# Patient Record
Sex: Male | Born: 1970
Health system: Southern US, Community
[De-identification: ages and names within clinical notes are randomized; demographics above are authoritative.]

## PROBLEM LIST (undated history)

## (undated) DIAGNOSIS — R251 Tremor, unspecified: Secondary | ICD-10-CM

## (undated) DIAGNOSIS — I77810 Thoracic aortic ectasia: Secondary | ICD-10-CM

## (undated) DIAGNOSIS — R011 Cardiac murmur, unspecified: Secondary | ICD-10-CM

## (undated) DIAGNOSIS — M1990 Unspecified osteoarthritis, unspecified site: Secondary | ICD-10-CM

## (undated) DIAGNOSIS — I493 Ventricular premature depolarization: Secondary | ICD-10-CM

## (undated) DIAGNOSIS — F17200 Nicotine dependence, unspecified, uncomplicated: Secondary | ICD-10-CM

## (undated) DIAGNOSIS — Z9289 Personal history of other medical treatment: Secondary | ICD-10-CM

## (undated) DIAGNOSIS — M549 Dorsalgia, unspecified: Secondary | ICD-10-CM

## (undated) DIAGNOSIS — F528 Other sexual dysfunction not due to a substance or known physiological condition: Secondary | ICD-10-CM

## (undated) DIAGNOSIS — M542 Cervicalgia: Secondary | ICD-10-CM

## (undated) HISTORY — DX: Thoracic aortic ectasia: I77.810

## (undated) HISTORY — DX: Ventricular premature depolarization: I49.3

## (undated) HISTORY — DX: Dorsalgia, unspecified: M54.9

## (undated) HISTORY — DX: Morbid (severe) obesity due to excess calories: E66.01

## (undated) HISTORY — DX: Other sexual dysfunction not due to a substance or known physiological condition: F52.8

## (undated) HISTORY — DX: Unspecified osteoarthritis, unspecified site: M19.90

## (undated) HISTORY — DX: Nicotine dependence, unspecified, uncomplicated: F17.200

## (undated) HISTORY — DX: Cervicalgia: M54.2

## (undated) HISTORY — DX: Tremor, unspecified: R25.1

---

## 1995-07-12 DIAGNOSIS — S069X9A Unspecified intracranial injury with loss of consciousness of unspecified duration, initial encounter: Secondary | ICD-10-CM

## 1995-07-12 HISTORY — DX: Unspecified intracranial injury with loss of consciousness of unspecified duration, initial encounter: S06.9X9A

## 1995-09-09 HISTORY — PX: FACIAL FRACTURE SURGERY: SHX1570

## 1997-12-22 ENCOUNTER — Emergency Department (HOSPITAL_COMMUNITY): Admission: EM | Admit: 1997-12-22 | Discharge: 1997-12-22 | Payer: Self-pay | Admitting: Emergency Medicine

## 1998-03-24 ENCOUNTER — Encounter: Payer: Self-pay | Admitting: Emergency Medicine

## 1998-03-24 ENCOUNTER — Emergency Department (HOSPITAL_COMMUNITY): Admission: EM | Admit: 1998-03-24 | Discharge: 1998-03-24 | Payer: Self-pay | Admitting: Emergency Medicine

## 1998-10-27 ENCOUNTER — Emergency Department (HOSPITAL_COMMUNITY): Admission: EM | Admit: 1998-10-27 | Discharge: 1998-10-27 | Payer: Self-pay | Admitting: Emergency Medicine

## 2001-12-05 ENCOUNTER — Emergency Department (HOSPITAL_COMMUNITY): Admission: EM | Admit: 2001-12-05 | Discharge: 2001-12-05 | Payer: Self-pay

## 2002-03-17 ENCOUNTER — Emergency Department (HOSPITAL_COMMUNITY): Admission: EM | Admit: 2002-03-17 | Discharge: 2002-03-17 | Payer: Self-pay | Admitting: Emergency Medicine

## 2002-09-25 ENCOUNTER — Encounter: Payer: Self-pay | Admitting: Emergency Medicine

## 2002-09-25 ENCOUNTER — Emergency Department (HOSPITAL_COMMUNITY): Admission: EM | Admit: 2002-09-25 | Discharge: 2002-09-25 | Payer: Self-pay | Admitting: Emergency Medicine

## 2005-02-11 ENCOUNTER — Emergency Department (HOSPITAL_COMMUNITY): Admission: EM | Admit: 2005-02-11 | Discharge: 2005-02-11 | Payer: Self-pay | Admitting: Emergency Medicine

## 2007-11-12 ENCOUNTER — Ambulatory Visit: Payer: Self-pay | Admitting: Family Medicine

## 2007-11-12 DIAGNOSIS — F528 Other sexual dysfunction not due to a substance or known physiological condition: Secondary | ICD-10-CM | POA: Insufficient documentation

## 2007-11-12 DIAGNOSIS — F17200 Nicotine dependence, unspecified, uncomplicated: Secondary | ICD-10-CM

## 2007-11-12 DIAGNOSIS — Z720 Tobacco use: Secondary | ICD-10-CM | POA: Insufficient documentation

## 2007-11-12 HISTORY — DX: Nicotine dependence, unspecified, uncomplicated: F17.200

## 2007-11-12 HISTORY — DX: Other sexual dysfunction not due to a substance or known physiological condition: F52.8

## 2007-11-16 ENCOUNTER — Encounter (INDEPENDENT_AMBULATORY_CARE_PROVIDER_SITE_OTHER): Payer: Self-pay | Admitting: Family Medicine

## 2007-11-23 ENCOUNTER — Encounter (INDEPENDENT_AMBULATORY_CARE_PROVIDER_SITE_OTHER): Payer: Self-pay | Admitting: Family Medicine

## 2007-12-11 ENCOUNTER — Encounter (INDEPENDENT_AMBULATORY_CARE_PROVIDER_SITE_OTHER): Payer: Self-pay | Admitting: Family Medicine

## 2007-12-11 ENCOUNTER — Ambulatory Visit: Payer: Self-pay | Admitting: Family Medicine

## 2007-12-11 LAB — CONVERTED CEMR LAB
ALT: 22 units/L (ref 0–53)
AST: 19 units/L (ref 0–37)
Albumin: 4.4 g/dL (ref 3.5–5.2)
Alkaline Phosphatase: 63 units/L (ref 39–117)
BUN: 17 mg/dL (ref 6–23)
CO2: 22 meq/L (ref 19–32)
Calcium: 9.4 mg/dL (ref 8.4–10.5)
Chloride: 106 meq/L (ref 96–112)
Cholesterol: 172 mg/dL (ref 0–200)
Creatinine, Ser: 1.14 mg/dL (ref 0.40–1.50)
Glucose, Bld: 96 mg/dL (ref 70–99)
HCV Ab: NEGATIVE
HDL: 56 mg/dL (ref >39)
Hepatitis B Surface Ag: NEGATIVE
LDL Cholesterol: 94 mg/dL (ref 0–99)
Potassium: 4.5 meq/L (ref 3.5–5.3)
Sodium: 138 meq/L (ref 135–145)
TSH: 1.952 microintl units/mL (ref 0.350–5.50)
Total Bilirubin: 0.4 mg/dL (ref 0.3–1.2)
Total CHOL/HDL Ratio: 3.1
Total Protein: 7.7 g/dL (ref 6.0–8.3)
Triglycerides: 112 mg/dL (ref <150)
VLDL: 22 mg/dL (ref 0–40)

## 2007-12-13 ENCOUNTER — Encounter (INDEPENDENT_AMBULATORY_CARE_PROVIDER_SITE_OTHER): Payer: Self-pay | Admitting: Family Medicine

## 2007-12-31 ENCOUNTER — Encounter (INDEPENDENT_AMBULATORY_CARE_PROVIDER_SITE_OTHER): Payer: Self-pay | Admitting: Family Medicine

## 2008-01-25 ENCOUNTER — Encounter (INDEPENDENT_AMBULATORY_CARE_PROVIDER_SITE_OTHER): Payer: Self-pay | Admitting: *Deleted

## 2008-01-25 ENCOUNTER — Encounter (INDEPENDENT_AMBULATORY_CARE_PROVIDER_SITE_OTHER): Payer: Self-pay | Admitting: Family Medicine

## 2008-04-09 ENCOUNTER — Emergency Department (HOSPITAL_COMMUNITY): Admission: EM | Admit: 2008-04-09 | Discharge: 2008-04-10 | Payer: Self-pay | Admitting: Emergency Medicine

## 2008-05-29 ENCOUNTER — Encounter (INDEPENDENT_AMBULATORY_CARE_PROVIDER_SITE_OTHER): Payer: Self-pay | Admitting: Family Medicine

## 2008-06-09 ENCOUNTER — Encounter: Payer: Self-pay | Admitting: *Deleted

## 2009-10-02 ENCOUNTER — Telehealth: Payer: Self-pay | Admitting: Family Medicine

## 2010-03-11 ENCOUNTER — Emergency Department (HOSPITAL_COMMUNITY): Admission: EM | Admit: 2010-03-11 | Discharge: 2010-03-11 | Payer: Self-pay | Admitting: Emergency Medicine

## 2010-06-23 ENCOUNTER — Emergency Department (HOSPITAL_COMMUNITY)
Admission: EM | Admit: 2010-06-23 | Discharge: 2010-06-24 | Payer: Self-pay | Source: Home / Self Care | Admitting: Emergency Medicine

## 2010-08-10 NOTE — Progress Notes (Signed)
  Phone Note Call from Patient   Caller: Spouse Summary of Call:  bad stomach pains since yesterday.  has had BRBPR off and on for months.  yesterday the toilet was full of blood with one of his bowel movements, and he had several more episodes of rectal bleeding, but with less blood.  has been to ER once in the past and had CT scan that was normal (per pt).  hehe has pain is in lower abdomen and sometimes in back.  sometimes in sides.  no fevers.  not lightheaded.  pain is not debilitating.  advised to go to ER if he has another episode of large volume rectal bleeding, fever 101 or higher, or pain worsens.  most importantly, pt needs to follow up in clinic (he has not been seen  at our clinic since 5/09 and has multiple DNKAs) for evaluation of his bleeding.   Initial call taken by: Asher Muir MD,  October 02, 2009 9:49 PM

## 2010-08-10 NOTE — Letter (Signed)
Summary: Lipid Letter  Blake [name] Blake [name]  [address]   [place], [place] [address]   Phone: [phone]  Fax: [phone]    12/13/2007  Blake [name] [address] [place], [place]  [address]  Dear [name]:  I have carefully reviewed your labs from 12/11/2007 and the results are noted below with a summary of recommendations for lipid management.  Your electrolytes, kidney function, liver function, and thyroid function were all normal.  Your screening blood tests were all negative.  Your Cholesterol Panel is as follows:    Cholesterol:       172     Goal: < 200   HDL "good" Cholesterol:   56     Goal: > 40   LDL "bad" Cholesterol:   94     Goal: < 130   Triglycerides:       112     Goal: < 150  All of your values are within your target range currently; however, it is still beneficial for your health to follow the following dietary recommendations.      TLC Diet (Therapeutic Lifestyle Change): Saturated Fats & Transfatty acids should be kept < 7% of total calories ***Reduce Saturated Fats Polyunstaurated Fat can be up to 10% of total calories Monounsaturated Fat Fat can be up to 20% of total calories Total Fat should be no greater than 25-35% of total calories Carbohydrates should be 50-60% of total calories Protein should be approximately 15% of total calories Fiber should be at least 20-30 grams a day ***Increased fiber may help lower LDL Total Cholesterol should be < 200mg /day Consider adding plant stanol/sterols to diet (example: Benacol spread) ***A higher intake of unsaturated fat may reduce Triglycerides and Increase HDL    Adjunctive Measures (may lower LIPIDS and reduce risk of Heart Attack) include: Aerobic Exercise (20-30 minutes 3-4 times a week) Limit Alcohol Consumption Weight Reduction Vitamin E 400 IU a day by mouth Folic Acid 1mg  a day by mouth Dietary Fiber 20-30 grams a day by mouth    Current Medications: 1)    Cialis 10  Mg  Tabs (Tadalafil) .Marland Kitchen.. 1 tablet by mouth as needed prior to intercourse - do not take more than 1 tablet in 24 hours  2)    Wellbutrin Xl 150 Mg  Tb24 (Bupropion hcl) .Marland Kitchen.. 1 tablet by mouth every morning - start at least 7 days prior to your quit date  If you have any questions, please call. We appreciate being able to work with you.   Sincerely,    [name] Blake Dun MD

## 2010-08-10 NOTE — Assessment & Plan Note (Signed)
Summary: DNKA,AG      Current Allergies: No known allergies         Complete Medication List: 1)  Cialis 10 Mg Tabs (Tadalafil) .Marland Kitchen.. 1 tablet by mouth as needed prior to intercourse - do not take more than 1 tablet in 24 hours 2)  Wellbutrin Xl 150 Mg Tb24 (Bupropion hcl) .Marland Kitchen.. 1 tablet by mouth every morning - start at least 7 days prior to your quit date    ]

## 2010-08-29 ENCOUNTER — Encounter: Payer: Self-pay | Admitting: *Deleted

## 2010-09-23 LAB — URINALYSIS, ROUTINE W REFLEX MICROSCOPIC
Glucose, UA: NEGATIVE mg/dL
Hgb urine dipstick: NEGATIVE
Ketones, ur: 15 mg/dL — AB
Nitrite: NEGATIVE
Protein, ur: NEGATIVE mg/dL
Specific Gravity, Urine: 1.026 (ref 1.005–1.030)
Urobilinogen, UA: 1 mg/dL (ref 0.0–1.0)
pH: 5.5 (ref 5.0–8.0)

## 2011-04-11 LAB — URINALYSIS, ROUTINE W REFLEX MICROSCOPIC
Bilirubin Urine: NEGATIVE
Glucose, UA: NEGATIVE
Hgb urine dipstick: NEGATIVE
Ketones, ur: NEGATIVE
Nitrite: NEGATIVE
Protein, ur: NEGATIVE
Specific Gravity, Urine: 1.023
Urobilinogen, UA: 0.2
pH: 6

## 2011-04-11 LAB — COMPREHENSIVE METABOLIC PANEL
ALT: 23
AST: 17
Albumin: 4
Alkaline Phosphatase: 62
BUN: 9
CO2: 24
Calcium: 9.4
Chloride: 103
Creatinine, Ser: 1.14
GFR calc Af Amer: >60
GFR calc non Af Amer: >60
Glucose, Bld: 95
Potassium: 3.6
Sodium: 136
Total Bilirubin: 0.7
Total Protein: 7.2

## 2011-04-11 LAB — DIFFERENTIAL
Basophils Absolute: 0
Basophils Relative: 1
Eosinophils Absolute: 0.1
Eosinophils Relative: 2
Lymphocytes Relative: 34
Lymphs Abs: 2.6
Monocytes Absolute: 0.6
Monocytes Relative: 8
Neutro Abs: 4.3
Neutrophils Relative %: 55

## 2011-04-11 LAB — CBC
HCT: 43.6
Hemoglobin: 14.8
MCHC: 34
MCV: 93.2
Platelets: 243
RBC: 4.68
RDW: 14
WBC: 7.6

## 2011-04-11 LAB — LIPASE, BLOOD: Lipase: 38

## 2011-05-24 ENCOUNTER — Emergency Department (HOSPITAL_COMMUNITY)
Admission: EM | Admit: 2011-05-24 | Discharge: 2011-05-24 | Disposition: A | Discharge status: Home / Self Care | Payer: Self-pay | Attending: Emergency Medicine | Admitting: Emergency Medicine

## 2011-05-24 DIAGNOSIS — L509 Urticaria, unspecified: Secondary | ICD-10-CM | POA: Insufficient documentation

## 2011-05-24 DIAGNOSIS — F17200 Nicotine dependence, unspecified, uncomplicated: Secondary | ICD-10-CM | POA: Insufficient documentation

## 2011-05-24 MED ORDER — FAMOTIDINE 40 MG PO TABS: 20.0000 mg | ORAL_TABLET | Freq: Every day | ORAL | Status: DC

## 2011-05-24 MED ORDER — FAMOTIDINE 20 MG PO TABS
20.0000 mg | ORAL_TABLET | Freq: Once | ORAL | Status: AC
  Administered 2011-05-24: 20 mg via ORAL
  Filled 2011-05-24: qty 1

## 2011-05-24 MED ORDER — PREDNISONE 20 MG PO TABS
60.0000 mg | ORAL_TABLET | ORAL | Status: AC
  Administered 2011-05-24: 60 mg via ORAL
  Filled 2011-05-24: qty 1

## 2011-05-24 MED ORDER — DIPHENHYDRAMINE HCL 50 MG PO TABS: 25.0000 mg | ORAL_TABLET | Freq: Every evening | ORAL | Status: AC | PRN

## 2011-05-24 MED ORDER — DIPHENHYDRAMINE HCL 25 MG PO CAPS
25.0000 mg | ORAL_CAPSULE | Freq: Four times a day (QID) | ORAL | Status: DC | PRN
  Administered 2011-05-24: 25 mg via ORAL
  Filled 2011-05-24: qty 1

## 2011-05-24 MED ORDER — PREDNISONE 20 MG PO TABS: 20.0000 mg | ORAL_TABLET | Freq: Every day | ORAL | Status: AC

## 2011-05-24 NOTE — ED Notes (Signed)
Pt states that he had onset of hives last night, none at this time.  States then went away before he got here.

## 2011-05-24 NOTE — ED Provider Notes (Signed)
History     CSN: [id number] Arrival date & time: 05/24/2011  9:31 AM   First MD Initiated Contact with Patient 05/24/11 1021      Chief Complaint  Patient presents with  . Urticaria    Patient is a 40 y.o. male presenting with urticaria.  Urticaria Pertinent negatives include no abdominal pain, chest pain, chills, coughing, diaphoresis, fever, nausea, neck pain, numbness, rash, vomiting or weakness.   Patient seen and evaluated at 9:31. Patient presents to emergency room with complaint of urinary.. Reports that he developed hives x1 last night it lasted for about 5 minutes. They went away on on their. Patient denies any respiratory involvement. Patient reports that he developed hives this morning as well upon arising. Patient denies any change in his soaps, detergent, and lotions, his soaps or any other new product. Denies any recent exposure to the woods or any yard work. Patient denies any new or a change in food. Patient denies any difficulty swallowing. Symptoms have completely resolved since coming to the emergency room. History reviewed. No pertinent past medical history.  History reviewed. No pertinent past surgical history.  History reviewed. No pertinent family history.  History  Substance Use Topics  . Smoking status: Current Everyday Smoker -- 0.5 packs/day  . Smokeless tobacco: Not on file  . Alcohol Use: Yes     Drink on weekends      Review of Systems  Unable to perform ROS Constitutional: Negative for fever, chills, diaphoresis, activity change, appetite change and unexpected weight change.  HENT: Negative for neck pain.   Eyes: Negative for photophobia and visual disturbance.  Respiratory: Negative for cough, chest tightness and shortness of breath.   Cardiovascular: Negative for chest pain.  Gastrointestinal: Negative for nausea, vomiting and abdominal pain.  Genitourinary: Negative for flank pain.  Musculoskeletal: Negative for back pain.  Skin: Negative  for rash.  Neurological: Negative for weakness and numbness.  All other systems reviewed and are negative.    Allergies  Review of patient's allergies indicates no known allergies.  Home Medications   Current Outpatient Rx  Name Route Sig Dispense Refill  . THERA M PLUS PO TABS Oral Take 1 tablet by mouth daily.      Marland Kitchen TADALAFIL 10 MG PO TABS Oral Take 10 mg by mouth as needed.        BP 135/83  Pulse 79  Temp(Src) 97.6 F (36.4 C) (Oral)  Resp 16  SpO2 97%  Physical Exam  Constitutional: He is oriented to person, place, and time. He appears well-developed and well-nourished. No distress.  HENT:  Head: Normocephalic and atraumatic.  Mouth/Throat: Oropharynx is clear and moist. No oropharyngeal exudate.       No uvula or soft palate swelling.  Eyes: EOM are normal. Pupils are equal, round, and reactive to light. Right eye exhibits no discharge. Left eye exhibits no discharge.  Neck: Normal range of motion. Neck supple. No tracheal deviation present. No thyromegaly present.  Cardiovascular: Normal rate and regular rhythm.  Exam reveals no gallop and no friction rub.   No murmur heard. Pulmonary/Chest: Effort normal and breath sounds normal. No stridor. No respiratory distress. He has no wheezes. He has no rales. He exhibits no tenderness.  Abdominal: Soft. Bowel sounds are normal. He exhibits no mass. There is no tenderness.  Musculoskeletal: Normal range of motion.  Lymphadenopathy:    He has no cervical adenopathy.  Neurological: He is alert and oriented to person, place, and time.  Skin:  Skin is warm and dry. No rash noted. He is not diaphoretic. No erythema. No pallor.       No hives.  Psychiatric: He has a normal mood and affect. His behavior is normal. Judgment and thought content normal.    ED Course  Procedures (including critical care time)  Patient seen and evaluated.  VSS reviewed. . Nursing notes reviewed.  Initial testing ordered. Will monitor the patient  closely. They agree with the treatment plan and diagnosis. No imaging needed at this times. NO hives. No respiratory involvement. Advised patient of warning signs to return. Stated agreement and understanding.    MDM  Hives        Demetrius Charity, PA 05/24/11 1047

## 2011-05-24 NOTE — ED Notes (Signed)
No hives noted at this time. 

## 2011-05-25 NOTE — ED Provider Notes (Signed)
Medical screening examination/treatment/procedure(s) were performed by non-physician practitioner and as supervising physician I was immediately available for consultation/collaboration.  Raeford Razor, MD 05/25/11 [phone]

## 2011-08-12 ENCOUNTER — Encounter: Payer: Self-pay | Admitting: Sports Medicine

## 2011-08-18 ENCOUNTER — Encounter: Payer: Self-pay | Admitting: Family Medicine

## 2013-09-26 ENCOUNTER — Emergency Department (HOSPITAL_COMMUNITY)
Admission: EM | Admit: 2013-09-26 | Discharge: 2013-09-26 | Disposition: A | Discharge status: Home / Self Care | Payer: Self-pay | Attending: Emergency Medicine | Admitting: Emergency Medicine

## 2013-09-26 ENCOUNTER — Encounter (HOSPITAL_COMMUNITY): Payer: Self-pay | Admitting: Emergency Medicine

## 2013-09-26 DIAGNOSIS — R21 Rash and other nonspecific skin eruption: Secondary | ICD-10-CM | POA: Insufficient documentation

## 2013-09-26 DIAGNOSIS — F17200 Nicotine dependence, unspecified, uncomplicated: Secondary | ICD-10-CM | POA: Insufficient documentation

## 2013-09-26 DIAGNOSIS — Z79899 Other long term (current) drug therapy: Secondary | ICD-10-CM | POA: Insufficient documentation

## 2013-09-26 LAB — HIV ANTIBODY (ROUTINE TESTING W REFLEX): HIV: NONREACTIVE

## 2013-09-26 LAB — RPR: RPR Ser Ql: NONREACTIVE

## 2013-09-26 MED ORDER — CETIRIZINE HCL 10 MG PO CAPS: 1.0000 | ORAL_CAPSULE | Freq: Every day | ORAL | Status: DC

## 2013-09-26 NOTE — Discharge Instructions (Signed)
Rash A rash is a change in the color or texture of your skin. There are many different types of rashes. You may have other problems that accompany your rash. CAUSES   Infections.  Allergic reactions. This can include allergies to pets or foods.  Certain medicines.  Exposure to certain chemicals, soaps, or cosmetics.  Heat.  Exposure to poisonous plants.  Tumors, both cancerous and noncancerous. SYMPTOMS   Redness.  Scaly skin.  Itchy skin.  Dry or cracked skin.  Bumps.  Blisters.  Pain. DIAGNOSIS  Your caregiver may do a physical exam to determine what type of rash you have. A skin sample (biopsy) may be taken and examined under a microscope. TREATMENT  Treatment depends on the type of rash you have. Your caregiver may prescribe certain medicines. For serious conditions, you may need to see a skin doctor (dermatologist). HOME CARE INSTRUCTIONS   Avoid the substance that caused your rash.  Do not scratch your rash. This can cause infection.  You may take cool baths to help stop itching.  Only take over-the-counter or prescription medicines as directed by your caregiver.  Keep all follow-up appointments as directed by your caregiver. SEEK IMMEDIATE MEDICAL CARE IF:  You have increasing pain, swelling, or redness.  You have a fever.  You have new or severe symptoms.  You have body aches, diarrhea, or vomiting.  Your rash is not better after 3 days. MAKE SURE YOU:  Understand these instructions.  Will watch your condition.  Will get help right away if you are not doing well or get worse. Document Released: 06/17/2002 Document Revised: 09/19/2011 Document Reviewed: 04/11/2011 [facility] Patient Information 2014 [place], [place].   Emergency Department Resource Guide 1) Find a Doctor and Pay Out of Pocket Although you won't have to find out who is covered by your insurance plan, it is a good idea to ask around and get recommendations. You will then need to  call the office and see if the doctor you have chosen will accept you as a new patient and what types of options they offer for patients who are self-pay. Some doctors offer discounts or will set up payment plans for their patients who do not have insurance, but you will need to ask so you aren't surprised when you get to your appointment.  2) Contact Your Local Health Department Not all health departments have doctors that can see patients for sick visits, but many do, so it is worth a call to see if yours does. If you don't know where your local health department is, you can check in your phone book. The CDC also has a tool to help you locate your state's health department, and many state websites also have listings of all of their local health departments.  3) Find a [place] [facility] If your illness is not likely to be very severe or complicated, you may want to try a walk in clinic. These are popping up all over the country in pharmacies, drugstores, and shopping centers. They're usually staffed by nurse practitioners or physician assistants that have been trained to treat common illnesses and complaints. They're usually fairly quick and inexpensive. However, if you have serious medical issues or chronic medical problems, these are probably not your best option.  No Primary Care Doctor: - Call Health Connect at  [phone] - they can help you locate a primary care doctor that  accepts your insurance, provides certain services, etc. - Physician Referral Service- [phone]  Chronic Pain Problems: Organization  Address  Phone   Notes  [facility]  [phone] Patients need to be referred by their primary care doctor.   Medication Assistance: Organization         Address  Phone   Notes  [facility] Medication [facility] [address] [place], [place] [address] [phone] --Must be a resident of [facility] -- Must have NO insurance  coverage whatsoever (no Medicaid/ Medicare, etc.) -- The pt. MUST have a primary care doctor that directs their care regularly and follows them in the community   [facility]ist  [phone]   [employer]  [phone]    Agencies that provide inexpensive medical care: Organization         Address  Phone   Notes  [place]  [phone]   [facility]    [phone]   [facility] [place], [place] [address] [phone]   [place] [address], [place] [phone]   Planned Parenthood    [phone]   [facility]    [phone]   [place] [address], [place] Phone:  [phone], Fax:  [phone] Hours of Operation:  9 am - 6 pm, M-F.  Also accepts Medicaid/Medicare and self-pay.  [facility] for [place], [address], [place] Phone: [phone], Fax: [phone]. Hours of Operation:  8:30 am - 5:30 pm, M-F.  Also accepts Medicaid and self-pay.  [facility] [place] [address] [place] Phone: [phone]   [place], [place], [place] [phone], Ext. [phone] Mondays & Thursdays: 7-9 AM.  First 15 patients are seen on a first come, first serve basis.    [place] Providers:  Organization         Address  Phone   Notes  [facility] [address], [place] [phone] Also accepts self-pay patients.  [facility] [address], [place]  [phone]   [place], [address], [place] [phone]   [facility] [address] [place] [phone]   Lucianne Lei [address], [place]   [phone] Only accepts [place] patients after they have their  name applied to their card.   Self-Pay (no insurance) in [facility]:  Organization         Address  Phone   Notes  Sickle Cell Patients, [facility] Internal Medicine [place] [phone]   [facility] [place] [phone]   [facility] [place]  [place], [place], [place] [phone]   [facility]/Dr. Osei-Bonsu  [address] [place] or [address], [place] [phone] Phone number for both [place] and [place] locations is the same.  [facility] [address] [place] [phone]   [facility] [address] [place] or [address] [phone]   [facility] [address], [place] [phone], phone; [phone], fax Sees patients 1st and 3rd Saturday of every month.  Must not qualify  for public or private insurance (i.e. Medicaid, Medicare, [place] Health Choice, Veterans' Benefits)  Household income should be no more than 200% of the poverty level The clinic cannot treat you if you are pregnant or think you are pregnant  Sexually transmitted diseases are not treated at the clinic.   Dental Care: Organization         Address  Phone  Notes  [facility] [address], [place] [phone] Accepts children up to age 43 who are enrolled in [place] or [place] Health Choice; pregnant women with a Medicaid card; and children who have applied for Medicaid or [place] Health Choice, but were declined, whose parents can pay a reduced fee at time of service.  [facility] [place]  [address], [place] [phone] Accepts children up to age 43 who are enrolled in [place] or [place] Health Choice; pregnant women with a Medicaid card; and children who have applied for  Medicaid or [place] Health Choice, but were declined, whose parents can pay a reduced fee at time of service.  [facility]  [address], [place] [phone] Patients are seen by appointment only. Walk-ins are not accepted. [facility] will see patients 118 years of age and older. Monday - Tuesday (8am-5pm) Most Wednesdays (8:30-5pm) $30 per visit, cash only  [facility]  [address], [facility] [phone] Patients are seen by appointment only. Walk-ins are not accepted. [facility] will see patients 43 years of age and older. One Wednesday Evening (Monthly: Volunteer Based).  $30 per visit, cash only  [employer] [facility] [facility]  [phone]) [phone] for adults; Children under age 554, call Graduate Pediatric Dentistry at [phone]) [phone]. Children aged 54-14, please call [phone]) [phone] to request a pediatric application.  Dental services are provided in all areas of dental care including fillings, crowns and bridges, complete and partial dentures, implants, gum treatment, root canals, and extractions. Preventive care is also provided. Treatment is provided to both adults and children. Patients are selected via a lottery and there is often a waiting list.   [facility] [address], [place]  [phone]) [phone] [email or web address]   [facility] [address], [place], [place] [phone], Ext. [phone] Second and Fourth Thursday of each month, opens at 6:30 AM; Clinic ends at 9 AM.  Patients are seen on a first-come first-served basis, and a limited number are seen during each clinic.   [facility]  [address], [place], [place] [phone]) [phone]   Eligibility Requirements You must have lived in [place], [place], or [place] counties for at least the last three months.   You cannot be eligible for state or federal sponsored [place] Cityhealthcare insurance, including CIGNAVeterans Administration, [place]icaid,  or [employer]icare.   You generally cannot be eligible for healthcare insurance through your employer.    How to apply: Eligibility screenings are held every Tuesday and Wednesday afternoon from 1:00 pm until 4:00 pm. You do not need an appointment for the interview!  [facility] [address], [place], [place] [phone]   [facility]  [phone]   [facility]  [phone]   [facility]  [phone]    Behavioral Health Resources in the Community: Intensive Outpatient Programs Organization         Address  Phone  Notes  [facility] [address], [place], [place] [phone]   [facility] Outpatient [address] [place], [place] [phone]   [facility] [address], [place], [place]  [phone]   [facility] [address]  [place], [place] [phone] or [phone]   Substance Abuse Resources Organization         Address  Phone  Notes  [facility] Drug [facility]  [phone]   [facility]  [phone]   [facility] [place]  [phone]   Floydene Flock  [phone]   Residential & Outpatient Substance Abuse Program  [phone]   Psychological Services Organization         Address  Phone  Notes  [facility]al Health  [phone]   [facility]  [phone]   [facility] Mental [facility] [address] [place] [phone] or [phone]    Mobile Crisis Teams Organization         Address  Phone  Notes  [facility], Mobile Crisis Care Unit  [phone]   [facility]  [address] [place], [place] [phone]   Doristine Locks [address] [place] [phone]    Self-Help/Support Groups Organization         Address  Phone             Notes  [facility] [place] - variety of support groups   336- I7437963 Call for more information  Narcotics Anonymous (NA), [facility] [address], [employer] [place]  2 meetings at this location   Statistician         Address  Phone  Notes  [facility] [address],    [place]  [phone]   [facility]  [address], [place] [address], [place], [place] [phone]   [facility] Treatment Facility [address] [place], [place] [phone] Admissions: 8am-3pm M-F  [facility] [address],    [place], [place] [phone]   [facility] [address] [place], [place], [place] [phone]   [facility] [address]  [place], [place] [phone]   [facility] - Intensive Outpatient [address], [place], [place] [phone]   [facility] (Addiction Recovery [facility] [address].,  [place], [place] [phone] or [phone]   [facility] ([facility]) [address], [place], [place] [phone] Accepts Medicaid  [facility] [address]  [place] [phone] Substance Abuse/Addiction Treatment   [facility] Organization         Address  Phone  Notes  [facility]  [phone]   Angie Fava, PhD [address] [place], [place]   [phone] or [phone]   [facility] Behavioral   [address] [place], [place] [phone]   [facility] [address] [place], [place] [phone] Insurance/Medicaid/sponsorship through [facility] and Families [address]                                    [place], [place] [phone] Therapy/tele-psych/case  [facility] [address], [place] [phone]    Dr. Lolly Mustache  [phone]   [facility]  [facility] 1) [address], [place] 2) [address], [place] 3)  [address], [place] [phone]  [phone]   [facility] Child Abuse Hotline [phone]) [phone] or [phone]) [phone] (After Hours)

## 2013-09-26 NOTE — ED Notes (Signed)
Pt escorted to discharge window. Verbalized understanding discharge instructions. In no acute distress. Vitals reviewed and WDL.

## 2013-09-26 NOTE — ED Provider Notes (Signed)
CSN: [id number]     Arrival date & time 09/26/13  4098 History   First MD Initiated Contact with Patient 09/26/13 0920     Chief Complaint  Patient presents with  . Rash    HPI Patient states he's had a pruritic rash since December that involves his extremities and torso. The patient wasn't present at that time. He was evaluated multiple times by the medical staff. Patient states he was treated for scabies several times. He was given prescriptions for steroid creams as well as antifungal creams. Patient also was given moisturizing creams. He was also moved from one presenting to another. His symptoms never resolved and he continues to notice small red bumps on the skin. Patient states he just got out of prison 2 weeks ago and is living back at home. He continues to notice the rash. He did have eczema when he was young but otherwise has not had chronic skin problems. He denies any trouble with fevers or chills History reviewed. No pertinent past medical history. History reviewed. No pertinent past surgical history. No family history on file. History  Substance Use Topics  . Smoking status: Current Every Day Smoker -- 0.50 packs/day  . Smokeless tobacco: Not on file  . Alcohol Use: Yes     Comment: Drink on weekends    Review of Systems  Constitutional: Negative for fever.  Cardiovascular: Negative for chest pain.  Gastrointestinal: Negative for abdominal pain.      Allergies  Review of patient's allergies indicates no known allergies.  Home Medications   Current Outpatient Rx  Name  Route  Sig  Dispense  Refill  . Cetirizine HCl 10 MG CAPS   Oral   Take 1 capsule (10 mg total) by mouth daily.   30 capsule   1   . EXPIRED: famotidine (PEPCID) 40 MG tablet   Oral   Take 0.5 tablets (20 mg total) by mouth daily. OTC   10 tablet   0   . Multiple Vitamins-Minerals (MULTIVITAMINS THER. W/MINERALS) TABS   Oral   Take 1 tablet by mouth daily.          . tadalafil (CIALIS)  10 MG tablet   Oral   Take 10 mg by mouth as needed.            BP 145/78  Pulse 77  Temp(Src) 98.2 F (36.8 C) (Oral)  Resp 20  SpO2 99% Physical Exam  Nursing note and vitals reviewed. Constitutional: He appears well-developed and well-nourished. No distress.  HENT:  Head: Normocephalic and atraumatic.  Right Ear: External ear normal.  Left Ear: External ear normal.  Eyes: Conjunctivae are normal. Right eye exhibits no discharge. Left eye exhibits no discharge. No scleral icterus.  Neck: Neck supple. No tracheal deviation present.  Cardiovascular: Normal rate.   Pulmonary/Chest: Effort normal. No stridor. No respiratory distress.  Musculoskeletal: He exhibits no edema.  Neurological: He is alert. Cranial nerve deficit: no gross deficits.  Skin: Skin is warm and dry. Rash noted.  Few small papules noted diffusely, primarily several areas of excoriation noted, no pustules, no purpura or petechiae  Psychiatric: He has a normal mood and affect.    ED Course  Procedures (including critical care time) Labs Review Labs Reviewed  RPR  HIV ANTIBODY (ROUTINE TESTING)   Imaging Review No results found.   EKG Interpretation None      MDM   Final diagnoses:  Rash    Discussed possible scabies treatment however the  patient states he's done that treatment several times without improvement. I will do an HIV and RPR screen.  I'm not certain as to the etiology of this rash but there does not appear to be any emergency medical condition. I recommend followup with a primary care doctor or dermatologist.    Kathalene Frames, MD 09/26/13 [phone]

## 2013-09-26 NOTE — ED Notes (Signed)
Pt was in prison in Dec began having this rash to entire body. "Red bumps" states that it is there constantly it is not changed with time of day. He recenlty was released 2 weeks ago and still has the rash. Pt has been on topical steriods, po steriods, multiple different creams, was given the "scabies" cream also. States that he is currently using [employer] and Eucerin lotion with no change. Has had ezcema in when he was young. Did notice a little change around his neck, but not much.

## 2013-11-02 ENCOUNTER — Emergency Department (INDEPENDENT_AMBULATORY_CARE_PROVIDER_SITE_OTHER)
Admission: EM | Admit: 2013-11-02 | Discharge: 2013-11-02 | Disposition: A | Discharge status: Home / Self Care | Payer: PRIVATE HEALTH INSURANCE | Source: Home / Self Care | Attending: Family Medicine | Admitting: Family Medicine

## 2013-11-02 ENCOUNTER — Encounter (HOSPITAL_COMMUNITY): Payer: Self-pay | Admitting: Emergency Medicine

## 2013-11-02 DIAGNOSIS — R21 Rash and other nonspecific skin eruption: Secondary | ICD-10-CM

## 2013-11-02 MED ORDER — HYDROXYZINE HCL 25 MG PO TABS: 25.0000 mg | ORAL_TABLET | Freq: Three times a day (TID) | ORAL | Status: DC | PRN

## 2013-11-02 MED ORDER — DIPHENHYDRAMINE HCL 25 MG PO CAPS
50.0000 mg | ORAL_CAPSULE | Freq: Once | ORAL | Status: AC
  Administered 2013-11-02: 50 mg via ORAL

## 2013-11-02 MED ORDER — DIPHENHYDRAMINE HCL 25 MG PO CAPS
ORAL_CAPSULE | ORAL | Status: AC
  Filled 2013-11-02: qty 2

## 2013-11-02 NOTE — ED Provider Notes (Signed)
Medical screening examination/treatment/procedure(s) were performed by resident physician or non-physician practitioner and as supervising physician I was immediately available for consultation/collaboration.   Pauline Good MD.   Billy Fischer, MD 11/02/13 [phone]

## 2013-11-02 NOTE — ED Notes (Signed)
Reports onset 12/14.  Patient has been in multiple living situations including incarceration.  Patient has used various medications and creams.

## 2013-11-02 NOTE — Discharge Instructions (Signed)
You will be notified of your lab results when available. Please use medication as prescribed for itching.  Rash A rash is a change in the color or texture of your skin. There are many different types of rashes. You may have other problems that accompany your rash. CAUSES   Infections.  Allergic reactions. This can include allergies to pets or foods.  Certain medicines.  Exposure to certain chemicals, soaps, or cosmetics.  Heat.  Exposure to poisonous plants.  Tumors, both cancerous and noncancerous. SYMPTOMS   Redness.  Scaly skin.  Itchy skin.  Dry or cracked skin.  Bumps.  Blisters.  Pain. DIAGNOSIS  Your caregiver may do a physical exam to determine what type of rash you have. A skin sample (biopsy) may be taken and examined under a microscope. TREATMENT  Treatment depends on the type of rash you have. Your caregiver may prescribe certain medicines. For serious conditions, you may need to see a skin doctor (dermatologist). HOME CARE INSTRUCTIONS   Avoid the substance that caused your rash.  Do not scratch your rash. This can cause infection.  You may take cool baths to help stop itching.  Only take over-the-counter or prescription medicines as directed by your caregiver.  Keep all follow-up appointments as directed by your caregiver. SEEK IMMEDIATE MEDICAL CARE IF:  You have increasing pain, swelling, or redness.  You have a fever.  You have new or severe symptoms.  You have body aches, diarrhea, or vomiting.  Your rash is not better after 3 days. MAKE SURE YOU:  Understand these instructions.  Will watch your condition.  Will get help right away if you are not doing well or get worse. Document Released: 06/17/2002 Document Revised: 09/19/2011 Document Reviewed: 04/11/2011 [facility] Patient Information 2014 [place], [place].

## 2013-11-02 NOTE — ED Provider Notes (Signed)
CSN: [id number]     Arrival date & time 11/02/13  1252 History   First MD Initiated Contact with Patient 11/02/13 1454     Chief Complaint  Patient presents with  . Rash  . Pruritis   (Consider location/radiation/quality/duration/timing/severity/associated sxs/prior Treatment) HPI Comments: Patient states he is here for evaluation of a rash that he has had since Dec. 2014. When rash began, he was incarcerated and was treated for both rashes of allergic origin with oral medication and topical steroids Was also treated with topical medication for scabies. States rash has not improved. Over last 3 days, he reports he has also developed a larger painless lesion on top of his left scrotal sack.  Denies fever/chills. States rash is widespread and has not changed with any of the previously prescribed therapies.  Has no PCP. No mucous membrane lesions and states rash is extremely pruritic.   The history is provided by the patient.    History reviewed. No pertinent past medical history. Past Surgical History  Procedure Laterality Date  . Facial fracture surgery     No family history on file. History  Substance Use Topics  . Smoking status: Current Every Day Smoker -- 0.50 packs/day  . Smokeless tobacco: Not on file  . Alcohol Use: Yes     Comment: Drink on weekends    Review of Systems  Constitutional: Negative.   HENT: Negative.   Eyes: Negative.   Respiratory: Negative.   Cardiovascular: Negative.   Gastrointestinal: Negative.   Endocrine: Negative.   Genitourinary: Negative.   Musculoskeletal: Negative.   Skin: Positive for rash. Negative for color change, pallor and wound.  Allergic/Immunologic: Negative.   Neurological: Negative.   Hematological: Negative.   Psychiatric/Behavioral: Negative.     Allergies  Review of patient's allergies indicates no known allergies.  Home Medications   Prior to Admission medications   Medication Sig Start Date End Date Taking?  Authorizing Provider  Cetirizine HCl 10 MG CAPS Take 1 capsule (10 mg total) by mouth daily. 09/26/13   Kathalene Frames, MD   BP 144/95  Pulse 87  Temp(Src) 98.4 F (36.9 C) (Oral)  Resp 16  SpO2 96% Physical Exam  Nursing note and vitals reviewed. Constitutional: He is oriented to person, place, and time. He appears well-developed and well-nourished. No distress.  +morbidly obese  HENT:  Head: Normocephalic and atraumatic.  Nose: Nose normal.  Mouth/Throat: Oropharynx is clear and moist.  Eyes: Conjunctivae are normal. Right eye exhibits no discharge. Left eye exhibits no discharge. No scleral icterus.  Neck: Normal range of motion. Neck supple.  Cardiovascular: Normal rate.   Pulmonary/Chest: Effort normal.  Genitourinary: Testes normal and penis normal.    Right testis shows no mass, no swelling and no tenderness. Left testis shows no mass, no swelling and no tenderness. Circumcised.  Musculoskeletal: Normal range of motion.  Lymphadenopathy:    He has no cervical adenopathy.  Neurological: He is alert and oriented to person, place, and time.  Skin: Skin is warm and dry. Rash noted.  Diffuse maculopapular rash, many with superficial excoriations. No mucous membrane lesions. No visible lesions on palms of hands but patient does have lesions that extend to the soles of his feet.   Psychiatric: He has a normal mood and affect. His behavior is normal.    ED Course  Procedures (including critical care time) Labs Review Labs Reviewed  RPR  HIV ANTIBODY (ROUTINE TESTING)    Imaging Review No results found.  MDM   1. 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 Atarax as directed for itching. Notified patient we would contact him with lab results when available.    [place], [place] 11/02/13 [phone]

## 2013-11-02 NOTE — ED Notes (Signed)
Patient requesting medication to take now

## 2013-11-03 LAB — RPR

## 2013-11-03 LAB — HIV ANTIBODY (ROUTINE TESTING W REFLEX): HIV 1&2 Ab, 4th Generation: NONREACTIVE

## 2015-06-10 ENCOUNTER — Emergency Department (HOSPITAL_COMMUNITY): Payer: Self-pay

## 2015-06-10 ENCOUNTER — Encounter (HOSPITAL_COMMUNITY): Payer: Self-pay | Admitting: Cardiology

## 2015-06-10 ENCOUNTER — Emergency Department (HOSPITAL_COMMUNITY)
Admission: EM | Admit: 2015-06-10 | Discharge: 2015-06-10 | Disposition: A | Discharge status: Home / Self Care | Payer: Self-pay | Attending: Emergency Medicine | Admitting: Emergency Medicine

## 2015-06-10 DIAGNOSIS — F17200 Nicotine dependence, unspecified, uncomplicated: Secondary | ICD-10-CM | POA: Insufficient documentation

## 2015-06-10 DIAGNOSIS — Y92411 Interstate highway as the place of occurrence of the external cause: Secondary | ICD-10-CM | POA: Insufficient documentation

## 2015-06-10 DIAGNOSIS — S161XXA Strain of muscle, fascia and tendon at neck level, initial encounter: Secondary | ICD-10-CM | POA: Insufficient documentation

## 2015-06-10 DIAGNOSIS — S39012A Strain of muscle, fascia and tendon of lower back, initial encounter: Secondary | ICD-10-CM | POA: Insufficient documentation

## 2015-06-10 DIAGNOSIS — Y9389 Activity, other specified: Secondary | ICD-10-CM | POA: Insufficient documentation

## 2015-06-10 DIAGNOSIS — G44209 Tension-type headache, unspecified, not intractable: Secondary | ICD-10-CM | POA: Insufficient documentation

## 2015-06-10 DIAGNOSIS — Y999 Unspecified external cause status: Secondary | ICD-10-CM | POA: Insufficient documentation

## 2015-06-10 MED ORDER — IBUPROFEN 800 MG PO TABS
800.0000 mg | ORAL_TABLET | Freq: Once | ORAL | Status: AC
Start: 2015-06-10 — End: 2015-06-10
  Administered 2015-06-10: 800 mg via ORAL
  Filled 2015-06-10: qty 1

## 2015-06-10 MED ORDER — NAPROXEN 500 MG PO TABS: 500.0000 mg | ORAL_TABLET | Freq: Two times a day (BID) | ORAL | Status: DC

## 2015-06-10 MED ORDER — OXYCODONE-ACETAMINOPHEN 5-325 MG PO TABS
2.0000 | ORAL_TABLET | Freq: Once | ORAL | Status: AC
  Administered 2015-06-10: 2 via ORAL
  Filled 2015-06-10: qty 2

## 2015-06-10 MED ORDER — METHOCARBAMOL 500 MG PO TABS: 500.0000 mg | ORAL_TABLET | Freq: Three times a day (TID) | ORAL | Status: DC | PRN

## 2015-06-10 MED ORDER — OXYCODONE-ACETAMINOPHEN 5-325 MG PO TABS: 2.0000 | ORAL_TABLET | ORAL | Status: DC | PRN

## 2015-06-10 MED ORDER — METHOCARBAMOL 500 MG PO TABS
1000.0000 mg | ORAL_TABLET | Freq: Once | ORAL | Status: AC
  Administered 2015-06-10: 1000 mg via ORAL
  Filled 2015-06-10: qty 2

## 2015-06-10 NOTE — Discharge Instructions (Signed)
Cervical Sprain A cervical sprain is when the tissues (ligaments) that hold the neck bones in place stretch or tear. HOME CARE   Put ice on the injured area.  Put ice in a plastic bag.  Place a towel between your skin and the bag.  Leave the ice on for 15-20 minutes, 3-4 times a day.  You may have been given a collar to wear. This collar keeps your neck from moving while you heal.  Do not take the collar off unless told by your doctor.  If you have long hair, keep it outside of the collar.  Ask your doctor before changing the position of your collar. You may need to change its position over time to make it more comfortable.  If you are allowed to take off the collar for cleaning or bathing, follow your doctor's instructions on how to do it safely.  Keep your collar clean by wiping it with mild soap and water. Dry it completely. If the collar has removable pads, remove them every 1-2 days to hand wash them with soap and water. Allow them to air dry. They should be dry before you wear them in the collar.  Do not drive while wearing the collar.  Only take medicine as told by your doctor.  Keep all doctor visits as told.  Keep all physical therapy visits as told.  Adjust your work station so that you have good posture while you work.  Avoid positions and activities that make your problems worse.  Warm up and stretch before being active. GET HELP IF:  Your pain is not controlled with medicine.  You cannot take less pain medicine over time as planned.  Your activity level does not improve as expected. GET HELP RIGHT AWAY IF:   You are bleeding.  Your stomach is upset.  You have an allergic reaction to your medicine.  You develop new problems that you cannot explain.  You lose feeling (become numb) or you cannot move any part of your body (paralysis).  You have tingling or weakness in any part of your body.  Your symptoms get worse. Symptoms include:  Pain,  soreness, stiffness, puffiness (swelling), or a burning feeling in your neck.  Pain when your neck is touched.  Shoulder or upper back pain.  Limited ability to move your neck.  Headache.  Dizziness.  Your hands or arms feel week, lose feeling, or tingle.  Muscle spasms.  Difficulty swallowing or chewing. MAKE SURE YOU:   Understand these instructions.  Will watch your condition.  Will get help right away if you are not doing well or get worse.   This information is not intended to replace advice given to you by your health care provider. Make sure you discuss any questions you have with your health care provider.   Document Released: 12/14/2007 Document Revised: 02/27/2013 Document Reviewed: 01/02/2013 Elsevier Interactive Patient Education 2016 [employer]  Lumbosacral Strain Lumbosacral strain is a strain of any of the parts that make up your lumbosacral vertebrae. Your lumbosacral vertebrae are the bones that make up the lower third of your backbone. Your lumbosacral vertebrae are held together by muscles and tough, fibrous tissue (ligaments).  CAUSES  A sudden blow to your back can cause lumbosacral strain. Also, anything that causes an excessive stretch of the muscles in the low back can cause this strain. This is typically seen when people exert themselves strenuously, fall, lift heavy objects, bend, or crouch repeatedly. RISK FACTORS  Physically demanding  work.  Participation in pushing or pulling sports or sports that require a sudden twist of the back (tennis, golf, baseball).  Weight lifting.  Excessive lower back curvature.  Forward-tilted pelvis.  Weak back or abdominal muscles or both.  Tight hamstrings. SIGNS AND SYMPTOMS  Lumbosacral strain may cause pain in the area of your injury or pain that moves (radiates) down your leg.  DIAGNOSIS Your health care provider can often diagnose lumbosacral strain through a physical exam. In some cases, you may  need tests such as X-ray exams.  TREATMENT  Treatment for your lower back injury depends on many factors that your clinician will have to evaluate. However, most treatment will include the use of anti-inflammatory medicines. HOME CARE INSTRUCTIONS   Avoid hard physical activities (tennis, racquetball, waterskiing) if you are not in proper physical condition for it. This may aggravate or create problems.  If you have a back problem, avoid sports requiring sudden body movements. Swimming and walking are generally safer activities.  Maintain good posture.  Maintain a healthy weight.  For acute conditions, you may put ice on the injured area.  Put ice in a plastic bag.  Place a towel between your skin and the bag.  Leave the ice on for 20 minutes, 2-3 times a day.  When the low back starts healing, stretching and strengthening exercises may be recommended. SEEK MEDICAL CARE IF:  Your back pain is getting worse.  You experience severe back pain not relieved with medicines. SEEK IMMEDIATE MEDICAL CARE IF:   You have numbness, tingling, weakness, or problems with the use of your arms or legs.  There is a change in bowel or bladder control.  You have increasing pain in any area of the body, including your belly (abdomen).  You notice shortness of breath, dizziness, or feel faint.  You feel sick to your stomach (nauseous), are throwing up (vomiting), or become sweaty.  You notice discoloration of your toes or legs, or your feet get very cold. MAKE SURE YOU:   Understand these instructions.  Will watch your condition.  Will get help right away if you are not doing well or get worse.   This information is not intended to replace advice given to you by your health care provider. Make sure you discuss any questions you have with your health care provider.   Document Released: 04/06/2005 Document Revised: 07/18/2014 Document Reviewed: 02/13/2013 Elsevier Interactive Patient  Education [employer].
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
## 2015-06-10 NOTE — ED Notes (Signed)
Transported to CT 

## 2015-06-10 NOTE — ED Notes (Signed)
Patient brought back to room with family in tow; patient getting undressed and into a gown at this time; visitor at bedside; Santiago Glad, RN aware of patient

## 2015-06-19 ENCOUNTER — Encounter: Payer: Self-pay | Admitting: Physical Therapy

## 2015-06-19 ENCOUNTER — Ambulatory Visit: Payer: Self-pay | Attending: Emergency Medicine | Admitting: Physical Therapy

## 2015-06-19 DIAGNOSIS — M436 Torticollis: Secondary | ICD-10-CM | POA: Insufficient documentation

## 2015-06-19 DIAGNOSIS — M5441 Lumbago with sciatica, right side: Secondary | ICD-10-CM | POA: Insufficient documentation

## 2015-06-19 DIAGNOSIS — M542 Cervicalgia: Secondary | ICD-10-CM | POA: Insufficient documentation

## 2015-06-19 DIAGNOSIS — M25551 Pain in right hip: Secondary | ICD-10-CM | POA: Insufficient documentation

## 2015-06-19 NOTE — Patient Instructions (Signed)
Sleeping on Back  Place pillow under knees. A pillow with cervical support and a roll around waist are also helpful. Copyright  VHI. All rights reserved.  Sleeping on Side Place pillow between knees. Use cervical support under neck and a roll around waist as needed. Copyright  VHI. All rights reserved.   Sleeping on Stomach   If this is the only desirable sleeping position, place pillow under lower legs, and under stomach or chest as needed.  Posture - Sitting   Sit upright, head facing forward. Try using a roll to support lower back. Keep shoulders relaxed, and avoid rounded back. Keep hips level with knees. Avoid crossing legs for long periods. Stand to Sit / Sit to Stand   To sit: Bend knees to lower self onto front edge of chair, then scoot back on seat. To stand: Reverse sequence by placing one foot forward, and scoot to front of seat. Use rocking motion to stand up.   Work Height and Reach  Ideal work height is no more than 2 to 4 inches below elbow level when standing, and at elbow level when sitting. Reaching should be limited to arm's length, with elbows slightly bent.  Bending  Bend at hips and knees, not back. Keep feet shoulder-width apart.    Posture - Standing   Good posture is important. Avoid slouching and forward head thrust. Maintain curve in low back and align ears over shoul- ders, hips over ankles.  Alternating Positions   Alternate tasks and change positions frequently to reduce fatigue and muscle tension. Take rest breaks. Computer Work   Position work to face forward. Use proper work and seat height. Keep shoulders back and down, wrists straight, and elbows at right angles. Use chair that provides full back support. Add footrest and lumbar roll as needed.  Getting Into / Out of Car  Lower self onto seat, scoot back, then bring in one leg at a time. Reverse sequence to get out.  Dressing  Lie on back to pull socks or slacks over feet, or sit  and bend leg while keeping back straight.    Housework - Sink  Place one foot on ledge of cabinet under sink when standing at sink for prolonged periods.   Pushing / Pulling  Pushing is preferable to pulling. Keep back in proper alignment, and use leg muscles to do the work.  Deep Squat   Squat and lift with both arms held against upper trunk. Tighten stomach muscles without holding breath. Use smooth movements to avoid jerking.  Avoid Twisting   Avoid twisting or bending back. Pivot around using foot movements, and bend at knees if needed when reaching for articles.  Carrying Luggage   Distribute weight evenly on both sides. Use a cart whenever possible. Do not twist trunk. Move body as a unit.   Lifting Principles .Maintain proper posture and head alignment. .Slide object as close as possible before lifting. .Move obstacles out of the way. .Test before lifting; ask for help if too heavy. .Tighten stomach muscles without holding breath. .Use smooth movements; do not jerk. .Use legs to do the work, and pivot with feet. .Distribute the work load symmetrically and close to the center of trunk. .Push instead of pull whenever possible.   Ask For Help   Ask for help and delegate to others when possible. Coordinate your movements when lifting together, and maintain the low back curve.  Log Roll   Lying on back, bend left knee and place left   arm across chest. Roll all in one movement to the right. Reverse to roll to the left. Always move as one unit. Housework - Sweeping  Use long-handled equipment to avoid stooping.   Housework - Wiping  Position yourself as close as possible to reach work surface. Avoid straining your back.  Laundry - Unloading Wash   To unload small items at bottom of washer, lift leg opposite to arm being used to reach.  Gardening - Raking  Move close to area to be raked. Use arm movements to do the work. Keep back straight and avoid  twisting.     Cart  When reaching into cart with one arm, lift opposite leg to keep back straight.   Getting Into / Out of Bed  Lower self to lie down on one side by raising legs and lowering head at the same time. Use arms to assist moving without twisting. Bend both knees to roll onto back if desired. To sit up, start from lying on side, and use same move-ments in reverse. Housework - Vacuuming  Hold the vacuum with arm held at side. Step back and forth to move it, keeping head up. Avoid twisting.   Laundry - Loading Wash  Position laundry basket so that bending and twisting can be avoided.   Laundry - Unloading Dryer  Squat down to reach into clothes dryer or use a reacher.  Gardening - Weeding / Planting  Squat or Kneel. Knee pads may be helpful.                    Reducing Load   Copyright  VHI. All rights reserved.  BODY MECHANICS Tips Good body mechanics are important during activities of daily living. The practice of good body mechanics will: -help distribute weight throughout the skeleton in a more anatomically correct manner thus stimulating more normal forces on the bones, and encouraging stronger, healthier, denser bones. -reduce unnatural forces on bones, ligaments, joints and muscles and reduce risk of fracture, other injury or back pain. A WORD ON BODY POSITIONING: Sitting is the hardest position for the back. Lying on the back is the easiest. Standing, in good body alignment, is somewhere between. A good motto is: Sit less, stand more, and, when you can't do that, lie down on your back and exercise to strengthen it.  Copyright  VHI. All rights reserved.       Supine to Sit (Active)   Lie on back, left leg bent. Roll to other side. From side-lying, sit up on side of bed. Complete ___ sets of ___ repetitions. Perform ___ sessions per day.  Copyright  VHI. All rights reserved.    Housework - Reaching Down   If you are unable to  bend your knees or squat, use a lazy Susan to keep items within easy reach. Store only light, unbreakable items on the lowest shelves, and use a reacher to pick them up.  Copyright  VHI. All rights reserved.  Low Shelf   Squat down, and bring item close to lift.   Copyright  VHI. All rights reserved.  Lifting Principles .Maintain proper posture and head alignment. .Slide object as close as possible before lifting. .Move obstacles out of the way. .Test before lifting; ask for help if too heavy. .Tighten stomach muscles without holding breath. .Use smooth movements; do not jerk. .Use legs to do the work, and pivot with feet. .Distribute the work load symmetrically and close to the center of trunk. .Push instead of   pull whenever possible.  Copyright  VHI. All rights reserved.  Posture - Standing   Good posture is important. Avoid slouching and forward head thrust. Maintain curve in low back and align ears over shoul- ders, hips over ankles.   Copyright  VHI. All rights reserved.   Posture - Sitting   Sit upright, head facing forward. Try using a roll to support lower back. Keep shoulders relaxed, and avoid rounded back. Keep hips level with knees. Avoid crossing legs for long periods.   Copyright  VHI. All rights reserved.  Ideal Posture Use with figures on 3 (2 of 2): 1.Head erect 2.Chin in 3.Chest and navel aligned 4.Spinal curves maintained 5.Knees relaxed 6.Shoulders and hips aligned 7.Feet slightly apart 8.Toes and arches active 9.Abdomen taut (breathe with diaphragm) 10.Arms at sides Ideal posture is: -pain free. -achieved with practice, mindful interest, and body awareness.  Copyright  VHI. All rights reserved.     Move heavy items one at a time, or move portions of the contents.   Posture Awareness     Stand and check posture: Jut chin, pull back to comfortable position. Tilt pelvis forward, back; be sure back is not swayed. Roll from heels to balls  of feet, then distribute your weight evenly. Picture a line through spine pulling you erect. Focus on breathing. Good Posture = Better Breathing. Check ____ times per day.  http://[email or web address].exer.us/873   Copyright  VHI. All rights reserved.   
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
## 2015-06-29 ENCOUNTER — Ambulatory Visit: Payer: No Typology Code available for payment source | Admitting: Physical Therapy

## 2015-07-01 ENCOUNTER — Telehealth: Payer: Self-pay | Admitting: Physical Therapy

## 2015-07-01 ENCOUNTER — Ambulatory Visit: Payer: No Typology Code available for payment source | Admitting: Physical Therapy

## 2015-07-01 NOTE — Telephone Encounter (Addendum)
Left message with patient regarding 2 no show appointments. Requested he call back if he would like to discharge from PT. Will cancel all appointments if he is a no show to next appointment.

## 2015-07-08 ENCOUNTER — Ambulatory Visit: Payer: No Typology Code available for payment source | Admitting: Physical Therapy

## 2015-07-14 ENCOUNTER — Ambulatory Visit: Payer: No Typology Code available for payment source | Attending: Emergency Medicine | Admitting: Physical Therapy

## 2015-07-16 ENCOUNTER — Encounter: Payer: Self-pay | Admitting: Physical Therapy

## 2017-06-22 ENCOUNTER — Ambulatory Visit (INDEPENDENT_AMBULATORY_CARE_PROVIDER_SITE_OTHER): Payer: Self-pay | Admitting: Physician Assistant

## 2017-06-22 ENCOUNTER — Encounter (INDEPENDENT_AMBULATORY_CARE_PROVIDER_SITE_OTHER): Payer: Self-pay | Admitting: Physician Assistant

## 2017-06-22 ENCOUNTER — Other Ambulatory Visit: Payer: Self-pay

## 2017-06-22 VITALS — BP 133/89 | HR 107 | Temp 98.3°F | Wt 362.6 lb

## 2017-06-22 DIAGNOSIS — G8929 Other chronic pain: Secondary | ICD-10-CM

## 2017-06-22 DIAGNOSIS — R251 Tremor, unspecified: Secondary | ICD-10-CM

## 2017-06-22 DIAGNOSIS — M5441 Lumbago with sciatica, right side: Secondary | ICD-10-CM

## 2017-06-22 DIAGNOSIS — Z131 Encounter for screening for diabetes mellitus: Secondary | ICD-10-CM

## 2017-06-22 DIAGNOSIS — M542 Cervicalgia: Secondary | ICD-10-CM

## 2017-06-22 DIAGNOSIS — R002 Palpitations: Secondary | ICD-10-CM

## 2017-06-22 LAB — POCT GLYCOSYLATED HEMOGLOBIN (HGB A1C): Hemoglobin A1C: 5.2

## 2017-06-22 MED ORDER — PROPRANOLOL HCL 40 MG PO TABS: 40.0000 mg | ORAL_TABLET | Freq: Three times a day (TID) | ORAL | 2 refills | Status: DC

## 2017-06-22 MED ORDER — GABAPENTIN 300 MG PO CAPS: 300.0000 mg | ORAL_CAPSULE | Freq: Three times a day (TID) | ORAL | 3 refills | Status: DC

## 2017-06-22 MED ORDER — CYCLOBENZAPRINE HCL 10 MG PO TABS: 10.0000 mg | ORAL_TABLET | Freq: Three times a day (TID) | ORAL | 0 refills | Status: DC | PRN

## 2017-06-22 MED ORDER — NAPROXEN 500 MG PO TABS: 500.0000 mg | ORAL_TABLET | Freq: Two times a day (BID) | ORAL | 0 refills | Status: DC
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
## 2017-06-22 NOTE — Patient Instructions (Signed)
Tremor A tremor is trembling or shaking that you cannot control. Most tremors affect the hands or arms. Tremors can also affect the head, vocal cords, face, and other parts of the body. There are many types of tremors. Common types include:  Essential tremor. These usually occur in people over the age of 40. It may run in families and can happen in otherwise healthy people.  Resting tremor. These occur when the muscles are at rest, such as when your hands are resting in your lap. People with Parkinson disease often have resting tremors.  Postural tremor. These occur when you try to hold a pose, such as keeping your hands outstretched.  Kinetic tremor. These occur during purposeful movement, such as trying to touch a finger to your nose.  Task-specific tremor. These may occur when you perform tasks such as handwriting, speaking, or standing.  Psychogenic tremor. These dramatically lessen or disappear when you are distracted. They can happen in people of all ages.  Some types of tremors have no known cause. Tremors can also be a symptom of nervous system problems (neurological disorders) that may occur with aging. Some tremors go away with treatment while others do not. Follow these instructions at home: Watch your tremor for any changes. The following actions may help to lessen any discomfort you are feeling:  Take medicines only as directed by your health care provider.  Limit alcohol intake to no more than 1 drink per day for nonpregnant women and 2 drinks per day for men. One drink equals 12 oz of beer, 5 oz of wine, or 1 oz of hard liquor.  Do not use any tobacco products, including cigarettes, chewing tobacco, or electronic cigarettes. If you need help quitting, ask your health care provider.  Avoid extreme heat or cold.  Limit the amount of caffeine you consumeas directed by your health care provider.  Try to get 8 hours of sleep each night.  Find ways to manage your stress,  such as meditation or yoga.  Keep all follow-up visits as directed by your health care provider. This is important.  Contact a health care provider if:  You start having a tremor after starting a new medicine.  You have tremor with other symptoms such as: ? Numbness. ? Tingling. ? Pain. ? Weakness.  Your tremor gets worse.  Your tremor interferes with your day-to-day life. This information is not intended to replace advice given to you by your health care provider. Make sure you discuss any questions you have with your health care provider. Document Released: 06/17/2002 Document Revised: 02/28/2016 Document Reviewed: 12/23/2013 Elsevier Interactive Patient Education  2018 [employer] Inc[employer]  

## 2017-06-23 ENCOUNTER — Telehealth (INDEPENDENT_AMBULATORY_CARE_PROVIDER_SITE_OTHER): Payer: Self-pay

## 2017-06-23 LAB — COMPREHENSIVE METABOLIC PANEL
ALT: 21 IU/L (ref 0–44)
AST: 19 IU/L (ref 0–40)
Albumin/Globulin Ratio: 2.1 (ref 1.2–2.2)
Albumin: 4.4 g/dL (ref 3.5–5.5)
Alkaline Phosphatase: 60 IU/L (ref 39–117)
BUN/Creatinine Ratio: 14 (ref 9–20)
BUN: 13 mg/dL (ref 6–24)
Bilirubin Total: 0.3 mg/dL (ref 0.0–1.2)
CO2: 21 mmol/L (ref 20–29)
Calcium: 9.3 mg/dL (ref 8.7–10.2)
Chloride: 105 mmol/L (ref 96–106)
Creatinine, Ser: 0.9 mg/dL (ref 0.76–1.27)
GFR calc Af Amer: 118 mL/min/{1.73_m2} (ref >59)
GFR calc non Af Amer: 102 mL/min/{1.73_m2} (ref >59)
Globulin, Total: 2.1 g/dL (ref 1.5–4.5)
Glucose: 88 mg/dL (ref 65–99)
Potassium: 4.7 mmol/L (ref 3.5–5.2)
Sodium: 141 mmol/L (ref 134–144)
Total Protein: 6.5 g/dL (ref 6.0–8.5)

## 2017-06-23 LAB — CBC WITH DIFFERENTIAL/PLATELET
Basophils Absolute: 0 10*3/uL (ref 0.0–0.2)
Basos: 0 %
EOS (ABSOLUTE): 0.1 10*3/uL (ref 0.0–0.4)
Eos: 1 %
Hematocrit: 45.8 % (ref 37.5–51.0)
Hemoglobin: 15.4 g/dL (ref 13.0–17.7)
Immature Grans (Abs): 0 10*3/uL (ref 0.0–0.1)
Immature Granulocytes: 0 %
Lymphocytes Absolute: 1.6 10*3/uL (ref 0.7–3.1)
Lymphs: 21 %
MCH: 31.4 pg (ref 26.6–33.0)
MCHC: 33.6 g/dL (ref 31.5–35.7)
MCV: 93 fL (ref 79–97)
Monocytes Absolute: 0.5 10*3/uL (ref 0.1–0.9)
Monocytes: 7 %
Neutrophils Absolute: 5.1 10*3/uL (ref 1.4–7.0)
Neutrophils: 71 %
Platelets: 271 10*3/uL (ref 150–379)
RBC: 4.91 x10E6/uL (ref 4.14–5.80)
RDW: 15.4 % (ref 12.3–15.4)
WBC: 7.3 10*3/uL (ref 3.4–10.8)

## 2017-06-23 LAB — SEDIMENTATION RATE: Sed Rate: 16 mm/hr — ABNORMAL HIGH (ref 0–15)

## 2017-06-23 LAB — TSH: TSH: 1.64 u[IU]/mL (ref 0.450–4.500)

## 2017-06-23 NOTE — Telephone Encounter (Signed)
-----   Message from Clent Demark, PA-C sent at 06/23/2017  8:49 AM EST ----- Labs mostly normal except for a slight increase in inflammatory marker. He has been referred to neurology.

## 2017-06-23 NOTE — Telephone Encounter (Signed)
Patient aware. Blake [name], CMA  
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
## 2017-07-20 ENCOUNTER — Ambulatory Visit (INDEPENDENT_AMBULATORY_CARE_PROVIDER_SITE_OTHER): Payer: Medicaid Other | Admitting: Physician Assistant

## 2017-07-20 ENCOUNTER — Other Ambulatory Visit: Payer: Self-pay

## 2017-07-20 ENCOUNTER — Encounter (INDEPENDENT_AMBULATORY_CARE_PROVIDER_SITE_OTHER): Payer: Self-pay | Admitting: Physician Assistant

## 2017-07-20 VITALS — BP 124/89 | HR 74 | Temp 97.9°F | Wt 374.0 lb

## 2017-07-20 DIAGNOSIS — R002 Palpitations: Secondary | ICD-10-CM | POA: Diagnosis not present

## 2017-07-20 DIAGNOSIS — G8929 Other chronic pain: Secondary | ICD-10-CM

## 2017-07-20 DIAGNOSIS — M542 Cervicalgia: Secondary | ICD-10-CM

## 2017-07-20 DIAGNOSIS — Z23 Encounter for immunization: Secondary | ICD-10-CM

## 2017-07-20 DIAGNOSIS — M5441 Lumbago with sciatica, right side: Secondary | ICD-10-CM

## 2017-07-20 DIAGNOSIS — R251 Tremor, unspecified: Secondary | ICD-10-CM

## 2017-07-20 MED ORDER — GABAPENTIN 600 MG PO TABS: 600.0000 mg | ORAL_TABLET | Freq: Three times a day (TID) | ORAL | 2 refills | Status: DC

## 2017-07-20 MED ORDER — ACETAMINOPHEN-CODEINE #3 300-30 MG PO TABS: 1.0000 | ORAL_TABLET | Freq: Three times a day (TID) | ORAL | 0 refills | Status: AC | PRN

## 2017-07-20 NOTE — Patient Instructions (Addendum)
Tremor A tremor is trembling or shaking that you cannot control. Most tremors affect the hands or arms. Tremors can also affect the head, vocal cords, face, and other parts of the body. There are many types of tremors. Common types include:  Essential tremor. These usually occur in people over the age of 68. It may run in families and can happen in otherwise healthy people.  Resting tremor. These occur when the muscles are at rest, such as when your hands are resting in your lap. People with Parkinson disease often have resting tremors.  Postural tremor. These occur when you try to hold a pose, such as keeping your hands outstretched.  Kinetic tremor. These occur during purposeful movement, such as trying to touch a finger to your nose.  Task-specific tremor. These may occur when you perform tasks such as handwriting, speaking, or standing.  Psychogenic tremor. These dramatically lessen or disappear when you are distracted. They can happen in people of all ages.  Some types of tremors have no known cause. Tremors can also be a symptom of nervous system problems (neurological disorders) that may occur with aging. Some tremors go away with treatment while others do not. Follow these instructions at home: Watch your tremor for any changes. The following actions may help to lessen any discomfort you are feeling:  Take medicines only as directed by your health care provider.  Limit alcohol intake to no more than 1 drink per day for nonpregnant women and 2 drinks per day for men. One drink equals 12 oz of beer, 5 oz of wine, or 1 oz of hard liquor.  Do not use any tobacco products, including cigarettes, chewing tobacco, or electronic cigarettes. If you need help quitting, ask your health care provider.  Avoid extreme heat or cold.  Limit the amount of caffeine you consumeas directed by your health care provider.  Try to get 8 hours of sleep each night.  Find ways to manage your stress,  such as meditation or yoga.  Keep all follow-up visits as directed by your health care provider. This is important.  Contact a health care provider if:  You start having a tremor after starting a new medicine.  You have tremor with other symptoms such as: ? Numbness. ? Tingling. ? Pain. ? Weakness.  Your tremor gets worse.  Your tremor interferes with your day-to-day life. This information is not intended to replace advice given to you by your health care provider. Make sure you discuss any questions you have with your health care provider. Document Released: 06/17/2002 Document Revised: 02/28/2016 Document Reviewed: 12/23/2013 Elsevier Interactive Patient Education  2018 [place]. Sciatica Sciatica is pain, numbness, weakness, or tingling along your sciatic nerve. The sciatic nerve starts in the lower back and goes down the back of each leg. Sciatica happens when this nerve is pinched or has pressure put on it. Sciatica usually goes away on its own or with treatment. Sometimes, sciatica may keep coming back (recur). Follow these instructions at home: Medicines  Take over-the-counter and prescription medicines only as told by your doctor.  Do not drive or use heavy machinery while taking prescription pain medicine. Managing pain  If directed, put ice on the affected area. ? Put ice in a plastic bag. ? Place a towel between your skin and the bag. ? Leave the ice on for 20 minutes, 2-3 times a day.  After icing, apply heat to the affected area before you exercise or as often as told  by your doctor. Use the heat source that your doctor tells you to use, such as a moist heat pack or a heating pad. ? Place a towel between your skin and the heat source. ? Leave the heat on for 20-30 minutes. ? Remove the heat if your skin turns bright red. This is especially important if you are unable to feel pain, heat, or cold. You may have a greater risk of getting burned. Activity  Return  to your normal activities as told by your doctor. Ask your doctor what activities are safe for you. ? Avoid activities that make your sciatica worse.  Take short rests during the day. Rest in a lying or standing position. This is usually better than sitting to rest. ? When you rest for a long time, do some physical activity or stretching between periods of rest. ? Avoid sitting for a long time without moving. Get up and move around at least one time each hour.  Exercise and stretch regularly, as told by your doctor.  Do not lift anything that is heavier than 10 lb (4.5 kg) while you have symptoms of sciatica. ? Avoid lifting heavy things even when you do not have symptoms. ? Avoid lifting heavy things over and over.  When you lift objects, always lift in a way that is safe for your body. To do this, you should: ? Bend your knees. ? Keep the object close to your body. ? Avoid twisting. General instructions  Use good posture. ? Avoid leaning forward when you are sitting. ? Avoid hunching over when you are standing.  Stay at a healthy weight.  Wear comfortable shoes that support your feet. Avoid wearing high heels.  Avoid sleeping on a mattress that is too soft or too hard. You might have less pain if you sleep on a mattress that is firm enough to support your back.  Keep all follow-up visits as told by your doctor. This is important. Contact a doctor if:  You have pain that: ? Wakes you up when you are sleeping. ? Gets worse when you lie down. ? Is worse than the pain you have had in the past. ? Lasts longer than 4 weeks.  You lose weight for without trying. Get help right away if:  You cannot control when you pee (urinate) or poop (have a bowel movement).  You have weakness in any of these areas and it gets worse. ? Lower back. ? Lower belly (pelvis). ? Butt (buttocks). ? Legs.  You have redness or swelling of your back.  You have a burning feeling when you  pee. This information is not intended to replace advice given to you by your health care provider. Make sure you discuss any questions you have with your health care provider. Document Released: 04/05/2008 Document Revised: 12/03/2015 Document Reviewed: 03/06/2015 Elsevier Interactive Patient Education  2018 [employer]. Influenza (Flu) Vaccine (Inactivated or Recombinant): What You Need to Know 1. Why get vaccinated? Influenza ("flu") is a contagious disease that spreads around the [place] every year, usually between October and May. Flu is caused by influenza viruses, and is spread mainly by coughing, sneezing, and close contact. Anyone can get flu. Flu strikes suddenly and can last several days. Symptoms vary by age, but can include:  fever/chills  sore throat  muscle aches  fatigue  cough  headache  runny or stuffy nose  Flu can also lead to pneumonia and blood infections, and cause diarrhea and seizures in children. If  you have a medical condition, such as heart or lung disease, flu can make it worse. Flu is more dangerous for some people. Infants and young children, people 27 years of age and older, pregnant women, and people with certain health conditions or a weakened immune system are at greatest risk. Each year thousands of people in the [place] die from flu, and many more are hospitalized. Flu vaccine can:  keep you from getting flu,  make flu less severe if you do get it, and  keep you from spreading flu to your family and other people. 2. Inactivated and recombinant flu vaccines A dose of flu vaccine is recommended every flu season. Children 6 months through 34 years of age may need two doses during the same flu season. Everyone else needs only one dose each flu season. Some inactivated flu vaccines contain a very small amount of a mercury-based preservative called thimerosal. Studies have not shown thimerosal in vaccines to be harmful, but flu vaccines  that do not contain thimerosal are available. There is no live flu virus in flu shots. They cannot cause the flu. There are many flu viruses, and they are always changing. Each year a new flu vaccine is made to protect against three or four viruses that are likely to cause disease in the upcoming flu season. But even when the vaccine doesn't exactly match these viruses, it may still provide some protection. Flu vaccine cannot prevent:  flu that is caused by a virus not covered by the vaccine, or  illnesses that look like flu but are not.  It takes about 2 weeks for protection to develop after vaccination, and protection lasts through the flu season. 3. Some people should not get this vaccine Tell the person who is giving you the vaccine:  If you have any severe, life-threatening allergies. If you ever had a life-threatening allergic reaction after a dose of flu vaccine, or have a severe allergy to any part of this vaccine, you may be advised not to get vaccinated. Most, but not all, types of flu vaccine contain a small amount of egg protein.  If you ever had Guillain-Barr Syndrome (also called GBS). Some people with a history of GBS should not get this vaccine. This should be discussed with your doctor.  If you are not feeling well. It is usually okay to get flu vaccine when you have a mild illness, but you might be asked to come back when you feel better.  4. Risks of a vaccine reaction With any medicine, including vaccines, there is a chance of reactions. These are usually mild and go away on their own, but serious reactions are also possible. Most people who get a flu shot do not have any problems with it. Minor problems following a flu shot include:  soreness, redness, or swelling where the shot was given  hoarseness  sore, red or itchy eyes  cough  fever  aches  headache  itching  fatigue  If these problems occur, they usually begin soon after the shot and last 1 or 2  days. More serious problems following a flu shot can include the following:  There may be a small increased risk of Guillain-Barre Syndrome (GBS) after inactivated flu vaccine. This risk has been estimated at 1 or 2 additional cases per million people vaccinated. This is much lower than the risk of severe complications from flu, which can be prevented by flu vaccine.  Young children who get the flu shot along with  pneumococcal vaccine (PCV13) and/or DTaP vaccine at the same time might be slightly more likely to have a seizure caused by fever. Ask your doctor for more information. Tell your doctor if a child who is getting flu vaccine has ever had a seizure.  Problems that could happen after any injected vaccine:  People sometimes faint after a medical procedure, including vaccination. Sitting or lying down for about 15 minutes can help prevent fainting, and injuries caused by a fall. Tell your doctor if you feel dizzy, or have vision changes or ringing in the ears.  Some people get severe pain in the shoulder and have difficulty moving the arm where a shot was given. This happens very rarely.  Any medication can cause a severe allergic reaction. Such reactions from a vaccine are very rare, estimated at about 1 in a million doses, and would happen within a few minutes to a few hours after the vaccination. As with any medicine, there is a very remote chance of a vaccine causing a serious injury or death. The safety of vaccines is always being monitored. For more information, visit: http://www.aguilar.org/ 5. What if there is a serious reaction? What should I look for? Look for anything that concerns you, such as signs of a severe allergic reaction, very high fever, or unusual behavior. Signs of a severe allergic reaction can include hives, swelling of the face and throat, difficulty breathing, a fast heartbeat, dizziness, and weakness. These would start a few minutes to a few hours after the  vaccination. What should I do?  If you think it is a severe allergic reaction or other emergency that can't wait, call 9-1-1 and get the person to the nearest hospital. Otherwise, call your doctor.  Reactions should be reported to the Vaccine Adverse Event Reporting System (VAERS). Your doctor should file this report, or you can do it yourself through the VAERS web site at www.vaers.SamedayNews.es, or by calling 629-350-3286. ? VAERS does not give medical advice. 6. The National Vaccine Injury Compensation Program The [employer] Vaccine Injury Compensation Program (VICP) is a federal program that was created to compensate people who may have been injured by certain vaccines. Persons who believe they may have been injured by a vaccine can learn about the program and about filing a claim by calling 902 259 0524 or visiting the [place] website at GoldCloset.com.ee. There is a time limit to file a claim for compensation. 7. How can I learn more?  Ask your healthcare provider. He or she can give you the vaccine package insert or suggest other sources of information.  Call your local or state health department.  Contact the Centers for Disease Control and Prevention (CDC): ? Call 305-774-7378 (1-800-CDC-INFO) or ? Visit CDC's website at https://gibson.com/ Vaccine Information Statement, Inactivated Influenza Vaccine (02/14/2014) This information is not intended to replace advice given to you by your health care provider. Make sure you discuss any questions you have with your health care provider. Document Released: 04/21/2006 Document Revised: 03/17/2016 Document Reviewed: 03/17/2016 Elsevier Interactive Patient Education  2017 [employer].

## 2017-07-20 NOTE — Progress Notes (Signed)
Subjective:  Patient ID: Blake [name], male    DOB: 05-Jan-1971  Age: 47 y.o. MRN: [id number]  CC: F/u multiple complaints  HPI  Blake [name] is a 47 y.o. male with a medical history of hives, LBP, and cervical strain presents to f/u on cervicalgia, chronic bilateral LBP with right sided sciatica, palpitations, and tremor. Was prescribed propranolol 40 mg TID for tremor and referrred to neurology nearly one month ago. Has appointment at [facility] neurology tomorrow. Says he is taking propranolol as directed and notices mildly less tremor.     In regards to LBP with RLE sciatica, pain is still present but is now characterized as burning instead of sharp. Takes gabapentin 300 mg TID but thinks the dosage is no longer effective.     In regards to palpitations, propranolol has helped significantly. Has felt maybe "one or two" palpitations over the last two months. Severity of the palpitations was also mild. Event monitor was ordered but patient has not heard from cardiology.  Outpatient Medications Prior to Visit  Medication Sig Dispense Refill  . gabapentin (NEURONTIN) 300 MG capsule Take 1 capsule (300 mg total) by mouth 3 (three) times daily. 90 capsule 3  . naproxen (NAPROSYN) 500 MG tablet Take 1 tablet (500 mg total) by mouth 2 (two) times daily with a meal. 30 tablet 0  . propranolol (INDERAL) 40 MG tablet Take 1 tablet (40 mg total) by mouth 3 (three) times daily. 30 tablet 2  . Cetirizine HCl 10 MG CAPS Take 1 capsule (10 mg total) by mouth daily. (Patient not taking: Reported on 06/10/2015) 30 capsule 1  . cyclobenzaprine (FLEXERIL) 10 MG tablet Take 1 tablet (10 mg total) by mouth 3 (three) times daily as needed for muscle spasms. (Patient not taking: Reported on 07/20/2017) 30 tablet 0  . hydrOXYzine (ATARAX/VISTARIL) 25 MG tablet Take 1 tablet (25 mg total) by mouth 3 (three) times daily as needed for itching. (Patient not taking: Reported on 06/10/2015) 30 tablet 0  . methocarbamol  (ROBAXIN) 500 MG tablet Take 1 tablet (500 mg total) by mouth 3 (three) times daily between meals as needed. (Patient not taking: Reported on 06/22/2017) 20 tablet 0  . oxyCODONE-acetaminophen (PERCOCET/ROXICET) 5-325 MG tablet Take 2 tablets by mouth every 4 (four) hours as needed. (Patient not taking: Reported on 06/22/2017) 20 tablet 0   No facility-administered medications prior to visit.      ROS Review of Systems  Constitutional: Negative for chills, fever and malaise/fatigue.  Eyes: Negative for blurred vision.  Respiratory: Negative for shortness of breath.   Cardiovascular: Positive for palpitations (infrequent). Negative for chest pain.  Gastrointestinal: Negative for abdominal pain and nausea.  Genitourinary: Negative for dysuria and hematuria.  Musculoskeletal: Negative for joint pain and myalgias.  Skin: Negative for rash.  Neurological: Positive for tremors. Negative for tingling and headaches.  Psychiatric/Behavioral: Negative for depression. The patient is not nervous/anxious.     Objective:  BP 124/89 (BP Location: Left Arm, Patient Position: Sitting, Cuff Size: Large)   Pulse 74   Temp 97.9 F (36.6 C) (Oral)   Wt (!) 374 lb (169.6 kg)   SpO2 93%   BMI 46.75 kg/m   BP/Weight 07/20/2017 06/22/2017 62/69/4854  Systolic BP 627 035 009  Diastolic BP 89 89 91  Wt. (Lbs) 374 362.6 350  BMI 46.75 45.32 43.75      Physical Exam  Constitutional: He is oriented to person, place, and time.  Well developed, overweight, NAD, polite  HENT:  Head: Normocephalic and atraumatic.  Eyes: No scleral icterus.  Neck: Normal range of motion. Neck supple. No thyromegaly present.  Cardiovascular: Normal rate, regular rhythm and normal heart sounds. Exam reveals no gallop.  No murmur heard. Pulmonary/Chest: Effort normal and breath sounds normal.  Abdominal: Soft. Bowel sounds are normal. There is no tenderness.  Musculoskeletal: He exhibits no edema.  Neurological: He is  alert and oriented to person, place, and time.  Bilateral hand tremor. Head bobbing.  Skin: Skin is warm and dry. No rash noted. No erythema. No pallor.  Psychiatric: He has a normal mood and affect. His behavior is normal. Thought content normal.  Vitals reviewed.    Assessment & Plan:    1. Chronic bilateral low back pain with right-sided sciatica - gabapentin (NEURONTIN) 600 MG tablet; Take 1 tablet (600 mg total) by mouth 3 (three) times daily.  Dispense: 90 tablet; Refill: 2 - acetaminophen-codeine (TYLENOL #3) 300-30 MG tablet; Take 1 tablet by mouth every 8 (eight) hours as needed for up to 10 days for moderate pain.  Dispense: 30 tablet; Refill: 0  2. Cervicalgia - acetaminophen-codeine (TYLENOL #3) 300-30 MG tablet; Take 1 tablet by mouth every 8 (eight) hours as needed for up to 10 days for moderate pain.  Dispense: 30 tablet; Refill: 0  3. Palpitations - Ambulatory referral to Cardiology  4. Need for prophylactic vaccination and inoculation against influenza - Flu Vaccine QUAD 6+ mos PF IM (Fluarix Quad PF)  5. Tremor observed on examination - propranolol (INDERAL) 40 MG tablet; Take 1 tablet (40 mg total) by mouth 3 (three) times daily.  Dispense: 90 tablet; Refill: 5   Meds ordered this encounter  Medications  . gabapentin (NEURONTIN) 600 MG tablet    Sig: Take 1 tablet (600 mg total) by mouth 3 (three) times daily.    Dispense:  90 tablet    Refill:  2    Order Specific Question:   Supervising Provider    Answer:   Tresa Garter [id number]  . acetaminophen-codeine (TYLENOL #3) 300-30 MG tablet    Sig: Take 1 tablet by mouth every 8 (eight) hours as needed for up to 10 days for moderate pain.    Dispense:  30 tablet    Refill:  0    Order Specific Question:   Supervising Provider    Answer:   Tresa Garter [id number]  . DISCONTD: propranolol (INDERAL) 40 MG tablet    Sig: Take 1 tablet (40 mg total) by mouth 3 (three) times daily.    Dispense:  90  tablet    Refill:  5    Order Specific Question:   Supervising Provider    Answer:   Tresa Garter [id number]  . propranolol (INDERAL) 40 MG tablet    Sig: Take 1 tablet (40 mg total) by mouth 3 (three) times daily.    Dispense:  90 tablet    Refill:  5    Order Specific Question:   Supervising Provider    Answer:   Tresa Garter [id number]    Follow-up: Return in about 6 weeks (around 08/31/2017) for back pain, tremor.   Clent Demark PA

## 2017-07-21 ENCOUNTER — Ambulatory Visit (INDEPENDENT_AMBULATORY_CARE_PROVIDER_SITE_OTHER): Payer: Medicaid Other | Admitting: Neurology

## 2017-07-21 ENCOUNTER — Encounter: Payer: Self-pay | Admitting: Neurology

## 2017-07-21 VITALS — BP 128/64 | HR 88 | Ht 75.0 in | Wt 368.0 lb

## 2017-07-21 DIAGNOSIS — Z720 Tobacco use: Secondary | ICD-10-CM

## 2017-07-21 DIAGNOSIS — G250 Essential tremor: Secondary | ICD-10-CM | POA: Diagnosis not present

## 2017-07-21 MED ORDER — PROPRANOLOL HCL 40 MG PO TABS: 40.0000 mg | ORAL_TABLET | Freq: Three times a day (TID) | ORAL | 5 refills | Status: DC

## 2017-07-26 DIAGNOSIS — R002 Palpitations: Secondary | ICD-10-CM | POA: Insufficient documentation

## 2017-07-26 NOTE — Progress Notes (Signed)
Cardiology Office Note    Date:  07/27/2017   ID:  Blake [name], DOB [birth year], MRN [id number]  PCP:  Clent Demark, PA-C  Cardiologist:  Fransico Him, MD   Chief Complaint  Patient presents with  . Palpitations    History of Present Illness:  Blake [name] is a 47 y.o. male who is being seen today for the evaluation of Palpitations at the request of Clent Demark, [place].  This is a 47yo male with a history of tobacco abuse, erectile dysfunction and tremor.  He was placed on propranolol for his tremor and was also complaining of palpitations.  He was supposed to get an event monitor in December but it does not appear that that was ever completed.  Since going on propranolol for tremors, his palpitations have significantly improved but he was only on propranolol for a while and his neurologist stopped it because she felt his tremors were not bad enough.  The palpitations are nonexertional and sporadic. He may feel it several times in a day for a few minutes.  He will get SOB with the fluttering but not dizzy. He also has had some tightness in his chest that is again sporadic.  Sometimes it occurs with the palpitations and at other times it occurs at rest.  It is midsternal in location with occasional nausea.  EKG done last month was completely normal.  He smokes 1pack every 2 days and a beer occasionally.  His mother died of an MI in her 28's.       Past Medical History:  Diagnosis Date  . Arthritis   . Back pain   . ERECTILE DYSFUNCTION, MILD 11/12/2007   Qualifier: Diagnosis of  By: Carolyne Littles    . TOBACCO ABUSE 11/12/2007   Qualifier: Diagnosis of  By: Carmie End MD, Junie Panning      Past Surgical History:  Procedure Laterality Date  . FACIAL FRACTURE SURGERY      Current Medications: Current Meds  Medication Sig  . acetaminophen-codeine (TYLENOL #3) 300-30 MG tablet Take 1 tablet by mouth every 8 (eight) hours as needed for up to 10 days for moderate pain.  Marland Kitchen gabapentin  (NEURONTIN) 600 MG tablet Take 1 tablet (600 mg total) by mouth 3 (three) times daily.    Allergies:   Patient has no known allergies.   Social History   Socioeconomic History  . Marital status: Single    Spouse name: None  . Number of children: None  . Years of education: None  . Highest education level: None  Social Needs  . Financial resource strain: None  . Food insecurity - worry: None  . Food insecurity - inability: None  . Transportation needs - medical: None  . Transportation needs - non-medical: None  Occupational History  . None  Tobacco Use  . Smoking status: Current Every Day Smoker    Packs/day: 0.50    Years: 30.00    Pack years: 15.00  . Smokeless tobacco: Never Used  Substance and Sexual Activity  . Alcohol use: Yes    Comment: Drink on weekends  . Drug use: No  . Sexual activity: None  Other Topics Concern  . None  Social History Narrative  . None     Family History:  The patient's family history includes COPD in his mother; Diabetes in his sister; Hypertension in his sister; Leukemia in his father.   ROS:   Please see the history of present illness.  ROS All other systems reviewed and are negative.  No flowsheet data found.     PHYSICAL EXAM:   VS:  BP (!) 141/85   Pulse 90   Ht 6\' 3"  (1.905 m)   Wt (!) 375 lb 9.6 oz (170.4 kg)   BMI 46.95 kg/m    GEN: Well nourished, well developed, in no acute distress  HEENT: normal  Neck: no JVD, carotid bruits, or masses Cardiac: RRR; no murmurs, rubs, or gallops,no edema.  Intact distal pulses bilaterally.  Respiratory:  clear to auscultation bilaterally, normal work of breathing GI: soft, nontender, nondistended, + BS MS: no deformity or atrophy  Skin: warm and dry, no rash Neuro:  Alert and Oriented x 3, Strength and sensation are intact Psych: euthymic mood, full affect  Wt Readings from Last 3 Encounters:  07/27/17 (!) 375 lb 9.6 oz (170.4 kg)  07/21/17 (!) 368 lb (166.9 kg)  07/20/17  (!) 374 lb (169.6 kg)      Studies/Labs Reviewed:   EKG:  EKG is not ordered today.   Recent Labs: 06/22/2017: ALT 21; BUN 13; Creatinine, Ser 0.90; Hemoglobin 15.4; Platelets 271; Potassium 4.7; Sodium 141; TSH 1.640   Lipid Panel    Component Value Date/Time   CHOL 172 12/11/2007 2056   TRIG 112 12/11/2007 2056   HDL 56 12/11/2007 2056   CHOLHDL 3.1 Ratio 12/11/2007 2056   VLDL 22 12/11/2007 2056   LDLCALC 94 12/11/2007 2056    Additional studies/ records that were reviewed today include:  Office notes from PCP   ASSESSMENT:    1. Palpitations   2. Chest pain, unspecified type   3. Tobacco abuse counseling      PLAN:  In order of problems listed above:  1.  Palpitations - he was supposed to have an event monitor but this was never done.  His palpitations have significantly improved on propranolol that he takes for tremors.  TSH, CBC and CMET were all normal.  EKG is completely normal.  I will get an event monitor although his palpitations have really settled down so may not be able to catch any.    2.  Chest pain - somewhat atypical in nature but does have risk factors including fm hx of CAD at an early age and tobacco use.  His EKG is nonischemic so I will get an ETT to rule out ischemia.    3.  Tobacco abuse - he wants to quit.  He has been smoking since he was 14.  Wellbutrin did not help and would like to try Chantix so I will give him a Rx for a starter pack and 2 month maintenance.    Medication Adjustments/Labs and Tests Ordered: Current medicines are reviewed at length with the patient today.  Concerns regarding medicines are outlined above.  Medication changes, Labs and Tests ordered today are listed in the Patient Instructions below.  There are no Patient Instructions on file for this visit.   Signed, Fransico Him, MD  07/27/2017 2:25 PM    [facility] [place], [place], [place]  [address] Phone: [phone]; Fax: [phone]

## 2017-07-27 ENCOUNTER — Ambulatory Visit: Payer: Medicaid Other | Admitting: Cardiology

## 2017-07-27 ENCOUNTER — Encounter (INDEPENDENT_AMBULATORY_CARE_PROVIDER_SITE_OTHER): Payer: Self-pay

## 2017-07-27 ENCOUNTER — Encounter: Payer: Self-pay | Admitting: Cardiology

## 2017-07-27 VITALS — BP 141/85 | HR 90 | Ht 75.0 in | Wt 375.6 lb

## 2017-07-27 DIAGNOSIS — R079 Chest pain, unspecified: Secondary | ICD-10-CM | POA: Diagnosis not present

## 2017-07-27 DIAGNOSIS — Z716 Tobacco abuse counseling: Secondary | ICD-10-CM | POA: Diagnosis not present

## 2017-07-27 DIAGNOSIS — R002 Palpitations: Secondary | ICD-10-CM | POA: Diagnosis not present

## 2017-07-27 MED ORDER — VARENICLINE TARTRATE 1 MG PO TABS: 1.0000 mg | ORAL_TABLET | Freq: Two times a day (BID) | ORAL | 0 refills | Status: AC

## 2017-07-27 MED ORDER — VARENICLINE TARTRATE 0.5 MG X 11 & 1 MG X 42 PO MISC: ORAL | 0 refills | Status: DC

## 2017-07-27 NOTE — Patient Instructions (Addendum)
Medication Instructions:  Your physician has recommended you make the following change in your medication:  START: Chantix 0.5 mg (1 tab) once a day for 3 days, INCREASE to 0.5 mg (1 tab) two times a day for 4 days, and then 1 mg (1 tab) two times a day  Continue to take Chantix 1 mg two times a day for 2 months.    Labwork: None ordered   Testing/Procedures: Your physician has requested that you have an exercise tolerance test. For further information please visit [email or web address]. Please also follow instruction sheet, as given.  Your physician has recommended that you wear an event monitor. Event monitors are medical devices that record the heart's electrical activity. Doctors most often [place] these monitors to diagnose arrhythmias. Arrhythmias are problems with the speed or rhythm of the heartbeat. The monitor is a small, portable device. You can wear one while you do your normal daily activities. This is usually used to diagnose what is causing palpitations/syncope (passing out).    Follow-Up: Follow up as needed with Dr. Radford Pax  Any Other Special Instructions Will Be Listed Below (If Applicable).     If you need a refill on your cardiac medications before your next appointment, please call your pharmacy.

## 2017-08-10 ENCOUNTER — Ambulatory Visit (INDEPENDENT_AMBULATORY_CARE_PROVIDER_SITE_OTHER): Payer: Medicaid Other

## 2017-08-10 DIAGNOSIS — R002 Palpitations: Secondary | ICD-10-CM

## 2017-08-10 DIAGNOSIS — R079 Chest pain, unspecified: Secondary | ICD-10-CM | POA: Diagnosis not present

## 2017-08-10 LAB — EXERCISE TOLERANCE TEST
Estimated workload: 6.9 METS
Exercise duration (min): 5 min
Exercise duration (sec): 0 s
MPHR: 174 {beats}/min
Peak HR: 153 {beats}/min
Percent HR: 87 %
RPE: 17
Rest HR: 82 {beats}/min

## 2017-08-31 ENCOUNTER — Encounter (INDEPENDENT_AMBULATORY_CARE_PROVIDER_SITE_OTHER): Payer: Self-pay | Admitting: Physician Assistant

## 2017-08-31 ENCOUNTER — Ambulatory Visit (INDEPENDENT_AMBULATORY_CARE_PROVIDER_SITE_OTHER): Payer: Medicaid Other | Admitting: Physician Assistant

## 2017-08-31 VITALS — BP 136/83 | HR 88 | Temp 97.6°F | Resp 18 | Ht 74.0 in | Wt 379.0 lb

## 2017-08-31 DIAGNOSIS — M5441 Lumbago with sciatica, right side: Secondary | ICD-10-CM | POA: Diagnosis not present

## 2017-08-31 DIAGNOSIS — G8929 Other chronic pain: Secondary | ICD-10-CM | POA: Diagnosis not present

## 2017-08-31 DIAGNOSIS — G250 Essential tremor: Secondary | ICD-10-CM

## 2017-08-31 DIAGNOSIS — R002 Palpitations: Secondary | ICD-10-CM | POA: Diagnosis not present

## 2017-08-31 MED ORDER — PROPRANOLOL HCL ER 60 MG PO CP24: 60.0000 mg | ORAL_CAPSULE | Freq: Every day | ORAL | 11 refills | Status: DC

## 2017-08-31 MED ORDER — ACETAMINOPHEN-CODEINE #3 300-30 MG PO TABS: 1.0000 | ORAL_TABLET | Freq: Two times a day (BID) | ORAL | 0 refills | Status: DC

## 2017-08-31 MED ORDER — CYCLOBENZAPRINE HCL 10 MG PO TABS: 10.0000 mg | ORAL_TABLET | Freq: Every day | ORAL | 0 refills | Status: DC

## 2017-08-31 MED ORDER — GABAPENTIN 600 MG PO TABS: 1200.0000 mg | ORAL_TABLET | Freq: Three times a day (TID) | ORAL | 3 refills | Status: DC
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
## 2017-08-31 NOTE — Patient Instructions (Signed)
Palpitations A palpitation is the feeling that your heart:  Has an uneven (irregular) heartbeat.  Is beating faster than normal.  Is fluttering.  Is skipping a beat.  This is usually not a serious problem. In some cases, you may need more medical tests. Follow these instructions at home:  Avoid: ? Caffeine in coffee, tea, soft drinks, diet pills, and energy drinks. ? Chocolate. ? Alcohol.  Do not use any tobacco products. These include cigarettes, chewing tobacco, and e-cigarettes. If you need help quitting, ask your doctor.  Try to reduce your stress. These things may help: ? Yoga. ? Meditation. ? Physical activity. Swimming, jogging, and walking are good choices. ? A method that helps you use your mind to control things in your body, like heartbeats (biofeedback).  Get plenty of rest and sleep.  Take over-the-counter and prescription medicines only as told by your doctor.  Keep all follow-up visits as told by your doctor. This is important. Contact a doctor if:  Your heartbeat is still fast or uneven after 24 hours.  Your palpitations occur more often. Get help right away if:  You have chest pain.  You feel short of breath.  You have a very bad headache.  You feel dizzy.  You pass out (faint). This information is not intended to replace advice given to you by your health care provider. Make sure you discuss any questions you have with your health care provider. Document Released: 04/05/2008 Document Revised: 12/03/2015 Document Reviewed: 03/12/2015 Elsevier Interactive Patient Education  2018 [employer]  

## 2017-09-14 ENCOUNTER — Telehealth: Payer: Self-pay

## 2017-09-14 ENCOUNTER — Encounter: Payer: Self-pay | Admitting: Physical Therapy

## 2017-09-14 ENCOUNTER — Ambulatory Visit: Payer: Medicaid Other | Attending: Physician Assistant | Admitting: Physical Therapy

## 2017-09-14 DIAGNOSIS — R293 Abnormal posture: Secondary | ICD-10-CM | POA: Diagnosis present

## 2017-09-14 DIAGNOSIS — M25651 Stiffness of right hip, not elsewhere classified: Secondary | ICD-10-CM | POA: Diagnosis present

## 2017-09-14 DIAGNOSIS — M6281 Muscle weakness (generalized): Secondary | ICD-10-CM | POA: Diagnosis present

## 2017-09-14 DIAGNOSIS — M5441 Lumbago with sciatica, right side: Secondary | ICD-10-CM | POA: Diagnosis not present

## 2017-09-14 DIAGNOSIS — I493 Ventricular premature depolarization: Secondary | ICD-10-CM

## 2017-09-14 DIAGNOSIS — M62838 Other muscle spasm: Secondary | ICD-10-CM | POA: Insufficient documentation
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
## 2017-09-14 NOTE — Patient Instructions (Addendum)
.  Neurovascular: Sciatic Nerve Stretch With Ankle Bias - Sitting    Sit with hands behind, chin on chest, back relaxed and rounded. Straighten left knee, then pull toes toward head. Repeat __10__ times per set. Do __1__ sets per session. Do _2___ sessions perday.  Copyright  VHI. All rights reserved.   Trigger Point Dry Needling  . What is Trigger Point Dry Needling (DN)? o DN is a physical therapy technique used to treat muscle pain and dysfunction. Specifically, DN helps deactivate muscle trigger points (muscle knots).  o A thin filiform needle is used to penetrate the skin and stimulate the underlying trigger point. The goal is for a local twitch response (LTR) to occur and for the trigger point to relax. No medication of any kind is injected during the procedure.   . What Does Trigger Point Dry Needling Feel Like?  o The procedure feels different for each individual patient. Some patients report that they do not actually feel the needle enter the skin and overall the process is not painful. Very mild bleeding may occur. However, many patients feel a deep cramping in the muscle in which the needle was inserted. This is the local twitch response.   Marland Kitchen How Will I feel after the treatment? o Soreness is normal, and the onset of soreness may not occur for a few hours. Typically this soreness does not last longer than two days.  o Bruising is uncommon, however; ice can be used to decrease any possible bruising.  o In rare cases feeling tired or nauseous after the treatment is normal. In addition, your symptoms may get worse before they get better, this period will typically not last longer than 24 hours.   . What Can I do After My Treatment? o Increase your hydration by drinking more water for the next 24 hours. o You may place ice or heat on the areas treated that have become sore, however, do not use heat on inflamed or bruised areas. Heat often brings more relief post needling. o You can  continue your regular activities, but vigorous activity is not recommended initially after the treatment for 24 hours. o DN is best combined with other physical therapy such as strengthening, stretching, and other therapies.  Posture Pelvic Tilt    With back against wall, feet shoulder width apart, knees slightly bent, place one hand in curve of lower back. Try to flatten low back so hand feels increased pressure. Repeat _10___ times. Do __3__ sessions per day.      Copyright  VHI. All rights reserved.  Standing Arch (Extension)   Place hands in small of back. Using hands as fulcrum, arch backward. Try to keep knees straight. Great exercise if sitting makes pain worse. Use to break up long periods of sitting. Repeat __5__ times. Do __3__ sessions per day.           Voncille Lo, PT Certified Exercise Expert for the Aging Adult  09/14/17 10:53 AM Phone: [phone] Fax: [phone]

## 2017-09-14 NOTE — Telephone Encounter (Signed)
Notes recorded by Teressa Senter, RN on 09/14/2017 at 4:08 PM EST Patient made aware of cardiac event monitor results and Dr. Theodosia Blender recommendation for echo to assess LVF. Patient in agreement with treatment plan, verbalized understanding and thankful for the call   Notes recorded by Sueanne Margarita, MD on 09/14/2017 at 2:33 PM EST Please let patient know that heart monitor showed occasional PVCs which, in the setting of normal ETT, are likely benign. Please get a 2D echo to assess LVF.

## 2017-09-18 ENCOUNTER — Ambulatory Visit: Payer: Medicaid Other | Admitting: Physical Therapy

## 2017-09-19 ENCOUNTER — Ambulatory Visit: Payer: Medicaid Other | Admitting: Physical Therapy

## 2017-09-25 ENCOUNTER — Encounter: Payer: Self-pay | Admitting: Physical Therapy

## 2017-09-25 ENCOUNTER — Ambulatory Visit: Payer: Medicaid Other | Admitting: Physical Therapy

## 2017-09-25 DIAGNOSIS — M5441 Lumbago with sciatica, right side: Secondary | ICD-10-CM | POA: Diagnosis not present

## 2017-09-25 DIAGNOSIS — M25651 Stiffness of right hip, not elsewhere classified: Secondary | ICD-10-CM

## 2017-09-25 DIAGNOSIS — M62838 Other muscle spasm: Secondary | ICD-10-CM

## 2017-09-25 DIAGNOSIS — M6281 Muscle weakness (generalized): Secondary | ICD-10-CM

## 2017-09-25 DIAGNOSIS — R293 Abnormal posture: Secondary | ICD-10-CM

## 2017-09-25 NOTE — Therapy (Signed)
[place], [place], [address] Phone: [phone]   Fax:  [phone]  Physical Therapy Treatment  Patient Details  Name: Blake [name] MRN: [id number] Date of Birth: [birth year] Referring Provider: Clent Demark PA   Encounter Date: 09/25/2017  PT End of Session - 09/25/17 1129    Visit Number  2    Number of Visits  12    Date for PT Re-Evaluation  10/26/17    Authorization Type  Medicaid ( recertify 4th visit)    PT Start Time  0845    PT Stop Time  0940    PT Time Calculation (min)  55 min    Activity Tolerance  Patient tolerated treatment well    Behavior During Therapy  [facility] for tasks assessed/performed       Past Medical History:  Diagnosis Date  . Arthritis   . Back pain   . ERECTILE DYSFUNCTION, MILD 11/12/2007   Qualifier: Diagnosis of  By: Carolyne Littles    . TOBACCO ABUSE 11/12/2007   Qualifier: Diagnosis of  By: Carmie End MD, Junie Panning      Past Surgical History:  Procedure Laterality Date  . FACIAL FRACTURE SURGERY      There were no vitals filed for this visit.  Subjective Assessment - 09/25/17 0853    Subjective  Pt arriving  to therapy today reportig 5/10 pain in right hip and low back. Pt feels the Dry Needling helped and reported moving better later that afternoon and the next day. Pt still reporting difficutly sleeping at night.   (Pended)     Pertinent History  obesity,  tic, ( head shaking)  (Pended)     Limitations  Sitting;Standing;Walking  (Pended)     How long can you sit comfortably?  , 5 minutes must shift  (Pended)     How long can you stand comfortably?  10 minutes  (Pended)     How long can you walk comfortably?  cant walk 1/2 mile, burning so bad I have to stop 10 minutes  (Pended)     Diagnostic tests  x ray  (Pended)     Patient Stated Goals  ease pain,  would like to walk for exercises,  (Pended)     Currently in Pain?  Yes  (Pended)     Pain Score  5   (Pended)      Pain Orientation  Right;Lower  (Pended)     Pain Descriptors / Indicators  Aching;Burning  (Pended)     Pain Type  Chronic pain  (Pended)     Pain Onset  More than a month ago  (Pended)     Pain Frequency  Constant  (Pended)     Pain Relieving Factors  felt better after last therapy session, laying down with pillows between legs  (Pended)                       [facility] Adult PT Treatment/Exercise - 09/25/17 0001      Exercises   Exercises  Lumbar      Lumbar Exercises: Stretches   Active Hamstring Stretch  3 reps;30 seconds    Active Hamstring Stretch Limitations  seated    Passive Hamstring Stretch  3 reps;30 seconds    Passive Hamstring Stretch Limitations  supine    Single Knee to Chest Stretch  Right;3 reps;30 seconds    Hip Flexor Stretch  Right;3 reps;30 seconds  ITB Stretch  Right;2 reps;30 seconds      Lumbar Exercises: Aerobic   Nustep  Level 4 x 5 minutes      Lumbar Exercises: Seated   Other Seated Lumbar Exercises  sciatic nerve flossing. seated with DF and neck flexion x 10 on right.       Lumbar Exercises: Supine   Bridge  10 reps;5 seconds    Bridge Limitations  Pt reported increased back pain    Other Supine Lumbar Exercises  supine ball squeezes x 10 hodling 5 seconds each    Other Supine Lumbar Exercises  SAQ 5# ankle weight x 10 reps      Modalities   Modalities  Moist Heat      Moist Heat Therapy   Number Minutes Moist Heat  10 Minutes    Moist Heat Location  Lumbar Spine;Hip             PT Education - 09/25/17 0927    Education provided  Yes    Education Details  Discussed additional TPDN, Posture, stretching    Person(s) Educated  Patient    Methods  Explanation;Demonstration    Comprehension  Verbalized understanding;Returned demonstration       PT Short Term Goals - 09/14/17 1017      PT SHORT TERM GOAL #1   Title  STG= LTG        PT Long Term Goals - 09/25/17 0001      PT LONG TERM GOAL #1   Title  Pt will be  able to demo safe and proper lifting techniques to prevent further injury.     Status  On-going      PT LONG TERM GOAL #2   Title  Pt will be able to sit for 30 min in order to be able to watch TV and to eat a meal with min discomfort    Status  New      PT LONG TERM GOAL #3   Title  Pt will be able to don socks on Right LE with min pain    Status  New      PT LONG TERM GOAL #4   Title  Pt will be able to demo 4+/5 strength in  LE grossly for improved function, and possible return to work function    Status  New      PT LONG TERM GOAL #5   Title  Pt will be able to stand up to an hour with min increase in low back pain and no lateral shift    Status  New      PT LONG TERM GOAL #6   Title  Pt will be able to negotiate steps without minimal pain    Status  New      PT LONG TERM GOAL #7   Title  Pt unable to lift items with proper posture and body mechanics to prevent further injury to back and prepare for work    Status  New      PT [place] #8   Title  "Pt will not wake due to pain while turning in bed while sleeping at night    Status  New              Patient will benefit from skilled therapeutic intervention in order to improve the following deficits and impairments:     Visit Diagnosis: Bilateral low back pain with right-sided sciatica, unspecified chronicity  Muscle weakness (generalized)  Muscle  spasm of right lower extremity  Stiffness of right hip, not elsewhere classified  Abnormal posture     Problem List Patient Active Problem List   Diagnosis Date Noted  . Chest pain 07/27/2017  . Palpitations 07/26/2017  . ERECTILE DYSFUNCTION, MILD 11/12/2007  . TOBACCO ABUSE 11/12/2007    Oretha Caprice, MPT 09/25/2017, 11:38 AM  [facility] [address] [place], [place], [address] Phone: [phone]   Fax:  [phone]  Name: Blake [name] MRN: [id number] Date of Birth: [birth year]

## 2017-09-26 ENCOUNTER — Encounter: Payer: Medicaid Other | Admitting: Physical Therapy

## 2017-09-26 ENCOUNTER — Telehealth: Payer: Self-pay | Admitting: Physical Therapy

## 2017-09-26 ENCOUNTER — Encounter: Payer: Self-pay | Admitting: Physical Therapy

## 2017-09-26 ENCOUNTER — Ambulatory Visit: Payer: Medicaid Other | Admitting: Physical Therapy

## 2017-09-26 DIAGNOSIS — M25651 Stiffness of right hip, not elsewhere classified: Secondary | ICD-10-CM

## 2017-09-26 DIAGNOSIS — M62838 Other muscle spasm: Secondary | ICD-10-CM

## 2017-09-26 DIAGNOSIS — M5441 Lumbago with sciatica, right side: Secondary | ICD-10-CM

## 2017-09-26 DIAGNOSIS — M6281 Muscle weakness (generalized): Secondary | ICD-10-CM

## 2017-09-26 DIAGNOSIS — R293 Abnormal posture: Secondary | ICD-10-CM

## 2017-09-26 NOTE — Telephone Encounter (Signed)
Called patient about a missed visit.  He thought his appointment was 8:45.  He just got on  the transport bus.  He will check to see if there are any openings at 8: 45 when he arrives.  I offered an appointment later in the day at 3 :  50 but patient has transportation issues.  Melvenia Needles  PTA

## 2017-09-26 NOTE — Patient Instructions (Signed)
From exercise drawer  Arm openings Daily 10 X each side

## 2017-09-26 NOTE — Therapy (Signed)
[place], [place], [address] Phone: [phone]   Fax:  [phone]  Physical Therapy Treatment  Patient Details  Name: Blake [name] MRN: [id number] Date of Birth: 1970-12-06 Referring Provider: Clent Demark PA   Encounter Date: 09/26/2017  PT End of Session - 09/26/17 1651    Visit Number  3    Number of Visits  12    Date for PT Re-Evaluation  10/26/17    Authorization Time Period  09/22/2017 though 10/12/2017    Authorization - Visit Number  2    Authorization - Number of Visits  3    PT Start Time  1550    PT Stop Time  1653    PT Time Calculation (min)  63 min    Activity Tolerance  Patient tolerated treatment well    Behavior During Therapy  [facility] for tasks assessed/performed       Past Medical History:  Diagnosis Date  . Arthritis   . Back pain   . ERECTILE DYSFUNCTION, MILD 11/12/2007   Qualifier: Diagnosis of  By: Carolyne Littles    . TOBACCO ABUSE 11/12/2007   Qualifier: Diagnosis of  By: Carmie End MD, Junie Panning      Past Surgical History:  Procedure Laterality Date  . FACIAL FRACTURE SURGERY      There were no vitals filed for this visit.  Subjective Assessment - 09/26/17 1556    Subjective  6/10  now.    It stays tight.    Currently in Pain?  Yes    Pain Score  5     Pain Location  Back    Pain Orientation  Lower;Right                      [facility] Adult PT Treatment/Exercise - 09/26/17 0001      Lumbar Exercises: Stretches   Hip Flexor Stretch Limitations  prone passive ,  increased pain in back    Quad Stretch  3 reps;30 seconds prone AA    Other Lumbar Stretch Exercise  prone right hip ER/IR stretches 4 reps X 20 second hold.  Stiff able to get neutral IR      Lumbar Exercises: Supine   Bridge Limitations  3 x increased hip leg pain to thigh    Other Supine Lumbar Exercises  TRA  breathing 5 X single clam   painful right.       Lumbar Exercises: Sidelying   Other  Sidelying Lumbar Exercises   PNF distal of each pattern 5 X 2 sets ablwe to decrease back pain with this    Other Sidelying Lumbar Exercises  book opener  10 X each side pilates styl with neurtal spine      Lumbar Exercises: Prone   Other Prone Lumbar Exercises    Multifitus press 10 X,   pRESS WITH  with knee flexion ,with straight  LEG only,  ansd  alternate UE LE      Modalities   Modalities  Moist Heat      Moist Heat Therapy   Number Minutes Moist Heat  15 Minutes    Moist Heat Location  Hip      Manual Therapy   Manual therapy comments  Long axis distraction  X 1 right LE increased back pain             PT Education - 09/26/17 1651    Education provided  Yes  Education Details  HEP    Person(s) Educated  Patient    Methods  Explanation;Demonstration;Verbal cues;Handout    Comprehension  Verbalized understanding;Returned demonstration       PT Short Term Goals - 09/14/17 1017      PT SHORT TERM GOAL #1   Title  STG= LTG        PT Long Term Goals - 09/25/17 0001      PT LONG TERM GOAL #1   Title  Pt will be able to demo safe and proper lifting techniques to prevent further injury.     Status  On-going      PT LONG TERM GOAL #2   Title  Pt will be able to sit for 30 min in order to be able to watch TV and to eat a meal with min discomfort    Status  New      PT LONG TERM GOAL #3   Title  Pt will be able to don socks on Right LE with min pain    Status  New      PT LONG TERM GOAL #4   Title  Pt will be able to demo 4+/5 strength in  LE grossly for improved function, and possible return to work function    Status  New      PT LONG TERM GOAL #5   Title  Pt will be able to stand up to an hour with min increase in low back pain and no lateral shift    Status  New      PT LONG TERM GOAL #6   Title  Pt will be able to negotiate steps without minimal pain    Status  New      PT LONG TERM GOAL #7   Title  Pt unable to lift items with proper posture and  body mechanics to prevent further injury to back and prepare for work    Status  New      PT [place] #8   Title  "Pt will not wake due to pain while turning in bed while sleeping at night    Status  New            Plan - 09/26/17 1654    Clinical Impression Statement  Pain continues.  Stiffness decreased post session.  Back pain reduced post session.  Pain continues in hip right.  Hip extension within available range prone 4-/5 bilateral.      PT Next Visit Plan  Modalites.   Try [place]/ traction.  basic back  Please assess MMT of LE if able to tolerate, extremely tight right hip continue stretch,  reviev HEP    PT Home Exercise Plan  standing pelvic tilt, lumbar extension, sciatic nerve stretch in sitting,  arm opener       Patient will benefit from skilled therapeutic intervention in order to improve the following deficits and impairments:     Visit Diagnosis: Bilateral low back pain with right-sided sciatica, unspecified chronicity  Muscle weakness (generalized)  Muscle spasm of right lower extremity  Stiffness of right hip, not elsewhere classified  Abnormal posture     Problem List Patient Active Problem List   Diagnosis Date Noted  . Chest pain 07/27/2017  . Palpitations 07/26/2017  . ERECTILE DYSFUNCTION, MILD 11/12/2007  . TOBACCO ABUSE 11/12/2007    HARRIS,KAREN PTA 09/26/2017, 4:59 PM  [facility] [address] [place], [place], [address] Phone: [phone]  Fax:  [phone]  Name: Blake [name] MRN: [id number] Date of Birth: 02-09-1971

## 2017-09-28 ENCOUNTER — Other Ambulatory Visit: Payer: Self-pay

## 2017-09-28 ENCOUNTER — Encounter: Payer: Self-pay | Admitting: Cardiology

## 2017-09-28 ENCOUNTER — Ambulatory Visit (HOSPITAL_BASED_OUTPATIENT_CLINIC_OR_DEPARTMENT_OTHER): Payer: Medicaid Other

## 2017-09-28 ENCOUNTER — Encounter (HOSPITAL_COMMUNITY): Payer: Self-pay | Admitting: Radiology

## 2017-09-28 ENCOUNTER — Ambulatory Visit (HOSPITAL_COMMUNITY)
Admission: RE | Admit: 2017-09-28 | Discharge: 2017-09-28 | Disposition: A | Discharge status: Home / Self Care | Payer: Medicaid Other | Source: Ambulatory Visit | Attending: Physician Assistant | Admitting: Physician Assistant

## 2017-09-28 DIAGNOSIS — I77810 Thoracic aortic ectasia: Secondary | ICD-10-CM | POA: Insufficient documentation

## 2017-09-28 DIAGNOSIS — M5441 Lumbago with sciatica, right side: Secondary | ICD-10-CM | POA: Diagnosis not present

## 2017-09-28 DIAGNOSIS — M47816 Spondylosis without myelopathy or radiculopathy, lumbar region: Secondary | ICD-10-CM | POA: Diagnosis not present

## 2017-09-28 DIAGNOSIS — Z720 Tobacco use: Secondary | ICD-10-CM | POA: Insufficient documentation

## 2017-09-28 DIAGNOSIS — I493 Ventricular premature depolarization: Secondary | ICD-10-CM | POA: Insufficient documentation

## 2017-09-28 DIAGNOSIS — G8929 Other chronic pain: Secondary | ICD-10-CM

## 2017-09-28 DIAGNOSIS — Z6841 Body Mass Index (BMI) 40.0 and over, adult: Secondary | ICD-10-CM | POA: Insufficient documentation

## 2017-09-28 MED ORDER — PERFLUTREN LIPID MICROSPHERE
1.0000 mL | INTRAVENOUS | Status: AC | PRN
  Administered 2017-09-28: 2 mL via INTRAVENOUS

## 2017-10-02 ENCOUNTER — Telehealth (INDEPENDENT_AMBULATORY_CARE_PROVIDER_SITE_OTHER): Payer: Self-pay

## 2017-10-02 NOTE — Telephone Encounter (Signed)
-----   Message from Clent Demark, PA-C sent at 09/29/2017  7:35 PM EDT ----- Mild Degenerative Disc Disease and facet arthropathy. Has to continue on his current rehab program and f/u with neuro after rehab.

## 2017-10-02 NOTE — Telephone Encounter (Signed)
Patient is aware. Blake [name] S [name], CMA  

## 2017-10-03 ENCOUNTER — Ambulatory Visit: Payer: Medicaid Other | Admitting: Physical Therapy

## 2017-10-03 ENCOUNTER — Encounter: Payer: Self-pay | Admitting: Physical Therapy

## 2017-10-03 DIAGNOSIS — M62838 Other muscle spasm: Secondary | ICD-10-CM

## 2017-10-03 DIAGNOSIS — M5441 Lumbago with sciatica, right side: Secondary | ICD-10-CM

## 2017-10-03 DIAGNOSIS — R293 Abnormal posture: Secondary | ICD-10-CM

## 2017-10-03 DIAGNOSIS — M25651 Stiffness of right hip, not elsewhere classified: Secondary | ICD-10-CM

## 2017-10-03 DIAGNOSIS — M6281 Muscle weakness (generalized): Secondary | ICD-10-CM

## 2017-10-03 NOTE — Therapy (Addendum)
[place], [place], [address] Phone: [phone]   Fax:  [phone]  Physical Therapy Treatment/Re authorization/Update  Patient Details  Name: Blake [name] MRN: [id number] Date of Birth: [birth year] Referring Provider: Clent Demark PA   Encounter Date: 10/03/2017  PT End of Session - 10/03/17 0758    Visit Number  4    Number of Visits  16   Date for PT Re-Evaluation  11/09/2017 add additional 2 x week for 6  weeks    Authorization Type  Medicaid     Authorization - Visit Number  3    Authorization - Number of Visits  4    PT Start Time  0800    PT Stop Time  0900    PT Time Calculation (min)  60 min    Activity Tolerance  Patient tolerated treatment well    Behavior During Therapy  [facility] for tasks assessed/performed       Past Medical History:  Diagnosis Date  . Arthritis   . Back pain   . Dilated aortic root (HCC)    39mm ascending aorta by echo 09/2017  . ERECTILE DYSFUNCTION, MILD 11/12/2007   Qualifier: Diagnosis of  By: Carolyne Littles    . TOBACCO ABUSE 11/12/2007   Qualifier: Diagnosis of  By: Carmie End MD, Junie Panning      Past Surgical History:  Procedure Laterality Date  . FACIAL FRACTURE SURGERY      There were no vitals filed for this visit.  Subjective Assessment - 10/03/17 0804    Subjective  5-6/10 , I feel like it stays tight.    Pertinent History  obesity,  tic, ( head shaking)    Limitations  Sitting;Standing;Walking    How long can you sit comfortably?  10-15 minutes    How long can you stand comfortably?  10 minutes    How long can you walk comfortably?  cant walk 1/2 mile, burning so bad I have to stop 10 minutes continuing    Diagnostic tests  x ray    Patient Stated Goals  ease pain,  would like to walk for exercises,    Currently in Pain?  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    Referring Provider  Clent Demark PA      AROM   Overall AROM   Deficits    Right Hip Flexion  100 pain 6/10     Right Hip External Rotation   25    Right Hip Internal Rotation   -10 pain and hard endfeel    Left Hip Flexion  120    Left Hip External Rotation   35    Left Hip Internal Rotation   16 no pain    Lumbar Flexion  52    Lumbar Extension  10 pain on right low back    Lumbar - Right Side Bend  12    Lumbar - Left Side Bend  13    Lumbar -  Right Rotation  50 % available    Lumbar - Left Rotation  50% available      Strength   Overall Strength  Deficits    Right Hip Flexion  3-/5    Right Hip Extension  3-/5    Right Hip External Rotation   3-/5    Right Hip ABduction  4-/5    Left Hip Flexion  4-/5    Left Hip Extension  4-/5    Left Hip External Rotation  4/5    Left Hip ABduction  4-/5      Palpation   Palpation comment  Pt with hard endfeel with right ER of hip.  Pt unable to come to neutral in hip extension. TTP to right QL, lumbar and gluteals            No data recorded       [facility] Adult PT Treatment/Exercise - 10/03/17 0845      Self-Care   Self-Care  ADL's;Lifting;Posture    ADL's  initial education on ADL's    Lifting  verbal explanation no demo today    Posture  education on posture with household chores and daily activities      Lumbar Exercises: Stretches   Hip Flexor Stretch  Right;3 reps;30 seconds and PT with pt on left side PROM x 2 for 60 sec    Other Lumbar Stretch Exercise  prone right hip ER/IR stretches 4 reps X 20 second hold.  Stiff able to get neutral IR    Other Lumbar Stretch Exercise  prone press up x 5,       Lumbar Exercises: Supine   Bridge Limitations  5 x bridge with pain in low back right       Moist Heat Therapy   Number Minutes Moist Heat  15 Minutes    Moist Heat Location  Hip      Manual Therapy   Manual therapy comments  Long axis distraction  with back pain decreasing today    Joint Mobilization  PA and AP hip mobs grade 4 5 x 20 second bouts,     Soft tissue mobilization  R QL and gluteals and lumbar paraspinals  Right psoas in supine and right knee hip flexed. psoas difficult to release       Trigger Point Dry Needling - 10/03/17 0815    Consent Given?  Yes    Education Handout Provided  Yes verbally reinforced    Muscles Treated Upper Body  Quadratus Lumborum right lumbar L-4/L 5 twitch, r QL twitch    Muscles Treated Lower Body  Gluteus minimus;Gluteus maximus;Piriformis right side only    Gluteus Maximus Response  Twitch response elicited;Palpable increased muscle length    Gluteus Minimus Response  Twitch response elicited;Palpable increased muscle length    Piriformis Response  Twitch response elicited;Palpable increased muscle length             PT Short Term Goals - 09/14/17 1017      PT SHORT TERM GOAL #1   Title  STG= LTG        PT Long Term Goals - 10/03/17 7564      PT LONG TERM GOAL #1   Title  Pt will be able to demo safe and proper lifting techniques to prevent further injury.     Baseline  Pt  educated initially and given handout today    Status  On-going    [place] Date  11/09/2017     PT  LONG TERM GOAL #2   Title  Pt will be able to sit for 30 min in order to be able to watch TV and to eat a meal with min discomfort    Baseline  was 5 minutes but now improved to 10-15    Status  On-going      PT LONG TERM GOAL #3   Title  Pt will be able to don socks on Right LE with min pain    Baseline  still unable to don socks normally but able to bring R heel to right tibial tubercle    Status  On-going      PT LONG TERM GOAL #4   Title  Pt will be able to demo 4+/5 strength in  LE grossly for improved function, and possible return to  work function    Baseline  Right hip extension 3-/5 today, hard end feel, hip abd 3-/5     Status  On-going      PT LONG TERM GOAL #5   Title  Pt will be able to stand up to an hour with min increase in low back pain and no lateral shift    Baseline  able to stand for 10 minutes , pain is 5-6/10    Status  On-going      PT LONG TERM GOAL #6   Title  Pt will be able to negotiate steps without minimal pain    Baseline  unable to negotiate steps without pain, learning how to use UE for safety due to pain    Status  On-going      PT LONG TERM GOAL #7   Title  Pt unable to lift items with proper posture and body mechanics to prevent further injury to back and prepare for work    Baseline  Pt given handout and verbally instructed , needs time and more reinforcement    Status  On-going      PT LONG TERM GOAL #8   Title  "Pt will not wake due to pain while turning in bed while sleeping at night    Baseline  Pt continues to wake up during the night with right back and hip pain whenever turning.     Status  On-going            Plan - 10/03/17 0800    Clinical Impression Statement  Pain continues at 5-6/10 with burning and aching across back. I am more aware of my posture now. My ROM is better. Pt hip extension 3-/5 on right . Pt has hard endfeel with ER of RIght hip and -10 hip extension begining AROM.  Pt  has increased minimally in lumbar AROM.  Pt still limited by 6/10 pain but states TPDN seems to help with AROM . Pt would benefit from continued skilled PT and a trial of lumbar traction to reduce pain in low back.  Pt needs 12 addtional visits to maximize AROM and strength for 2 x a week in  The  6 weeks before returning to MD.( end date 11-09-17.  Pt is compliant and eager to participate fully to reduce pain and and impairments. See Flow sheet for specific AROM increases.     Rehab Potential  Good    PT Frequency  2x /week for 6 weeks   PT Duration  6 weeks    PT Treatment/Interventions   ADLs/Self Care Home Management;Cryotherapy;Electrical Stimulation;Iontophoresis 4mg /ml Dexamethasone;Moist Heat;Traction;Neuromuscular re-education;Therapeutic exercise;Therapeutic activities;Stair training;Functional mobility training;Patient/family education;Manual techniques;Passive range of motion;Dry  needling;Taping    PT Next Visit Plan  Modalites.   Try [place]/ traction.  basic back  Please assess MMT of LE if able to tolerate, extremely tight right hip continue stretch,  reviev HEP    PT Home Exercise Plan  standing pelvic tilt, lumbar extension, sciatic nerve stretch in sitting,  arm opener, prone press ups, Hip flexion on Right stretch    Consulted and Agree with Plan of Care  Patient       Patient will benefit from skilled therapeutic intervention in order to improve the following deficits and impairments:  Pain, Obesity, Improper body mechanics, Postural dysfunction, Impaired flexibility, Increased muscle spasms, Increased fascial restricitons, Decreased range of motion, Decreased mobility, Hypomobility, Decreased strength  Visit Diagnosis: Bilateral low back pain with right-sided sciatica, unspecified chronicity  Muscle weakness (generalized)  Muscle spasm of right lower extremity  Stiffness of right hip, not elsewhere classified  Abnormal posture     Problem List Patient Active Problem List   Diagnosis Date Noted  . Dilated aortic root ([place])   . Chest pain 07/27/2017  . Palpitations 07/26/2017  . ERECTILE DYSFUNCTION, MILD 11/12/2007  . TOBACCO ABUSE 11/12/2007    Voncille Lo, PT Certified Exercise Expert for the Aging Adult  10/03/17 10:07 AM Phone: [phone] Fax: [facility] [address] [place], [place], [address] Phone: [phone]   Fax:  [phone]  Name: TAEVION SIKORA MRN: [id number] Date of Birth: [birth year]

## 2017-10-03 NOTE — Patient Instructions (Signed)
Sleeping on Back  Place pillow under knees. A pillow with cervical support and a roll around waist are also helpful. Copyright  VHI. All rights reserved.  Sleeping on Side Place pillow between knees. Use cervical support under neck and a roll around waist as needed. Copyright  VHI. All rights reserved.   Sleeping on Stomach   If this is the only desirable sleeping position, place pillow under lower legs, and under stomach or chest as needed.  Posture - Sitting   Sit upright, head facing forward. Try using a roll to support lower back. Keep shoulders relaxed, and avoid rounded back. Keep hips level with knees. Avoid crossing legs for long periods. Stand to Sit / Sit to Stand   To sit: Bend knees to lower self onto front edge of chair, then scoot back on seat. To stand: Reverse sequence by placing one foot forward, and scoot to front of seat. Use rocking motion to stand up.   Work Height and Reach  Ideal work height is no more than 2 to 4 inches below elbow level when standing, and at elbow level when sitting. Reaching should be limited to arm's length, with elbows slightly bent.  Bending  Bend at hips and knees, not back. Keep feet shoulder-width apart.    Posture - Standing   Good posture is important. Avoid slouching and forward head thrust. Maintain curve in low back and align ears over shoul- ders, hips over ankles.  Alternating Positions   Alternate tasks and change positions frequently to reduce fatigue and muscle tension. Take rest breaks. Computer Work   Position work to face forward. Use proper work and seat height. Keep shoulders back and down, wrists straight, and elbows at right angles. Use chair that provides full back support. Add footrest and lumbar roll as needed.  Getting Into / Out of Car  Lower self onto seat, scoot back, then bring in one leg at a time. Reverse sequence to get out.  Dressing  Lie on back to pull socks or slacks over feet, or sit  and bend leg while keeping back straight.    Housework - Sink  Place one foot on ledge of cabinet under sink when standing at sink for prolonged periods.   Pushing / Pulling  Pushing is preferable to pulling. Keep back in proper alignment, and use leg muscles to do the work.  Deep Squat   Squat and lift with both arms held against upper trunk. Tighten stomach muscles without holding breath. Use smooth movements to avoid jerking.  Avoid Twisting   Avoid twisting or bending back. Pivot around using foot movements, and bend at knees if needed when reaching for articles.  Carrying Luggage   Distribute weight evenly on both sides. Use a cart whenever possible. Do not twist trunk. Move body as a unit.   Lifting Principles .Maintain proper posture and head alignment. .Slide object as close as possible before lifting. .Move obstacles out of the way. .Test before lifting; ask for help if too heavy. .Tighten stomach muscles without holding breath. .Use smooth movements; do not jerk. .Use legs to do the work, and pivot with feet. .Distribute the work load symmetrically and close to the center of trunk. .Push instead of pull whenever possible.   Ask For Help   Ask for help and delegate to others when possible. Coordinate your movements when lifting together, and maintain the low back curve.  Log Roll   Lying on back, bend left knee and place left   arm across chest. Roll all in one movement to the right. Reverse to roll to the left. Always move as one unit. Housework - Sweeping  Use long-handled equipment to avoid stooping.   Housework - Wiping  Position yourself as close as possible to reach work surface. Avoid straining your back.  Laundry - Unloading Wash   To unload small items at bottom of washer, lift leg opposite to arm being used to reach.  [place] close to area to be raked. Use arm movements to do the work. Keep back straight and avoid  twisting.        Cart  When reaching into cart with one arm, lift opposite leg to keep back straight.   Getting Into / Out of Bed  Lower self to lie down on one side by raising legs and lowering head at the same time. Use arms to assist moving without twisting. Bend both knees to roll onto back if desired. To sit up, start from lying on side, and use same move-ments in reverse. Housework - Vacuuming  Hold the vacuum with arm held at side. Step back and forth to move it, keeping head up. Avoid twisting.   Laundry - IT consultant so that bending and twisting can be avoided.   Laundry - Unloading Dryer  Squat down to reach into clothes dryer or use a reacher.  Gardening - Weeding / Probation officer or Kneel. Knee pads may be helpful.                       [email or web address].[email or web address]   HIP: Flexors - Supine   Lie on edge of surface. Place leg off the surface, allow knee to bend. Bring other knee toward chest. Hold _60__ seconds. _3__ reps per set, _2__ sets per day, _7__ days per week Rest lowered foot on stool. Blake [name], PT Certified Exercise Expert for the Aging Adult  10/03/17 8:28 AM Phone: [phone] Fax: [phone]

## 2017-10-05 ENCOUNTER — Encounter: Payer: Medicaid Other | Admitting: Physical Therapy

## 2017-10-10 ENCOUNTER — Ambulatory Visit: Payer: Medicaid Other | Admitting: Physical Therapy

## 2017-10-10 NOTE — Addendum Note (Signed)
Addended by: Dorothea Ogle on: 10/10/2017 11:25 AM   Modules accepted: Orders

## 2017-10-12 ENCOUNTER — Ambulatory Visit: Payer: Medicaid Other | Admitting: Physical Therapy

## 2017-10-17 ENCOUNTER — Ambulatory Visit: Payer: Medicaid Other | Admitting: Physical Therapy

## 2017-10-19 ENCOUNTER — Ambulatory Visit: Payer: Medicaid Other | Admitting: Physical Therapy

## 2017-10-24 ENCOUNTER — Encounter: Payer: Self-pay | Admitting: Physical Therapy

## 2017-10-24 ENCOUNTER — Ambulatory Visit: Payer: Medicaid Other | Attending: Physician Assistant | Admitting: Physical Therapy

## 2017-10-24 DIAGNOSIS — R293 Abnormal posture: Secondary | ICD-10-CM | POA: Diagnosis present

## 2017-10-24 DIAGNOSIS — M25651 Stiffness of right hip, not elsewhere classified: Secondary | ICD-10-CM | POA: Diagnosis present

## 2017-10-24 DIAGNOSIS — M6281 Muscle weakness (generalized): Secondary | ICD-10-CM | POA: Insufficient documentation

## 2017-10-24 DIAGNOSIS — M62838 Other muscle spasm: Secondary | ICD-10-CM | POA: Diagnosis present

## 2017-10-24 DIAGNOSIS — M5441 Lumbago with sciatica, right side: Secondary | ICD-10-CM | POA: Insufficient documentation

## 2017-10-24 NOTE — Therapy (Addendum)
[place], [place], [address] Phone: [phone]   Fax:  [phone]  Physical Therapy Treatment  Patient Details  Name: Blake [name] MRN: [id number] Date of Birth: [birth year] Referring Provider: Clent Demark PA   Encounter Date: 10/24/2017  PT End of Session - 10/24/17 1209    Visit Number  5    Number of Visits  16    Date for PT Re-Evaluation  11/09/17    Authorization Type  Medicaid (11 remaining visits)    Authorization Time Period  10/23/17 throuch 12-03-17    PT Start Time  0932    PT Stop Time  1027    PT Time Calculation (min)  55 min    Activity Tolerance  Patient tolerated treatment well    Behavior During Therapy  [facility] for tasks assessed/performed       Past Medical History:  Diagnosis Date  . Arthritis   . Back pain   . Dilated aortic root (HCC)    30mm ascending aorta by echo 09/2017  . ERECTILE DYSFUNCTION, MILD 11/12/2007   Qualifier: Diagnosis of  By: Carolyne Littles    . TOBACCO ABUSE 11/12/2007   Qualifier: Diagnosis of  By: Carmie End MD, Junie Panning      Past Surgical History:  Procedure Laterality Date  . FACIAL FRACTURE SURGERY      There were no vitals filed for this visit.  Subjective Assessment - 10/24/17 0936    Subjective  5-6/10, I feel like its tight, I feel like my back is bothering me more than it was. I am more aware    Pertinent History  obesity,  tic, ( head shaking)    Limitations  Sitting;Standing;Walking    How long can you sit comfortably?  10-15 minutes    How long can you stand comfortably?  10 minutes    How long can you walk comfortably?  10 minutes limited by pain    Diagnostic tests  x ray    Patient Stated Goals  ease pain,  would like to walk for exercises,    Currently in Pain?  Yes    Pain Score  5     Pain Location  Back    Pain Orientation  Lower;Right    Pain Descriptors / Indicators  Burning;Aching;Sharp    Pain Type  Chronic pain    Pain Onset   More than a month ago    Pain Frequency  Constant    Multiple Pain Sites  Yes    Pain Score  6    Pain Location  Hip    Pain Orientation  Right;Left    Pain Descriptors / Indicators  Aching    Pain Type  Acute pain    Pain Onset  More than a month ago    Pain Frequency  Intermittent    Aggravating Factors   comes and goes when I move                       [facility] Adult PT Treatment/Exercise - 10/24/17 0941      Lumbar Exercises: Stretches   Active Hamstring Stretch  3 reps;30 seconds    Active Hamstring Stretch Limitations  with strap    Single Knee to Chest Stretch  Right;30 seconds;5 reps;10 seconds    Lower Trunk Rotation  5 reps;10 seconds bil    Pelvic Tilt  10 reps;10 seconds    Other Lumbar Stretch Exercise  prone right hip ER/IR stretches 4 reps X 20 second hold.  Stiff able to get neutral IR    Other Lumbar Stretch Exercise  prone press up x 5,  pain improved but stil at 4/10      Lumbar Exercises: Aerobic   Elliptical  5 min very slow and increased pain in back and bil hips      Lumbar Exercises: Standing   Other Standing Lumbar Exercises  standing hip flex, ext and abd 15 each on right for strengthening      Lumbar Exercises: Supine   Pelvic Tilt  10 reps;5 seconds      Modalities   Modalities  Moist Heat      Moist Heat Therapy   Number Minutes Moist Heat  15 Minutes    Moist Heat Location  Lumbar Spine;Hip right      Manual Therapy   Manual Therapy  Joint mobilization;Soft tissue mobilization    Manual therapy comments  Long axis distraction  with back pain decreasing today skilled palpation with TPDN    Joint Mobilization  PA and AP hip mobs grade 4 5 x 20 second bouts,     Soft tissue mobilization  right gluteals and hip IR/ER with soft tissue mobilization of hip muscles             PT Education - 10/24/17 0949    Education provided  Yes    Education Details  added basic back to HEP     Person(s) Educated  Patient    Methods   Explanation;Demonstration;Verbal cues;Tactile cues    Comprehension  Verbalized understanding;Returned demonstration       PT Short Term Goals - 09/14/17 1017      PT SHORT TERM GOAL #1   Title  STG= LTG        PT Long Term Goals - 10/03/17 0347      PT LONG TERM GOAL #1   Title  Pt will be able to demo safe and proper lifting techniques to prevent further injury.     Baseline  Pt  educated initially and given handout today    Status  On-going    Target Date  11/07/17      PT LONG TERM GOAL #2   Title  Pt will be able to sit for 30 min in order to be able to watch TV and to eat a meal with min discomfort    Baseline  was 5 minutes but now improved to 10-15    Status  On-going      PT LONG TERM GOAL #3   Title  Pt will be able to don socks on Right LE with min pain    Baseline  still unable to don socks normally but able to bring R heel to right tibial tubercle    Status  On-going      PT LONG TERM GOAL #4   Title  Pt will be able to demo 4+/5 strength in  LE grossly for improved function, and possible return to work function    Baseline  Right hip extension 3-/5 today, hard end feel, hip abd 3-/5     Status  On-going      PT LONG TERM GOAL #5   Title  Pt will be able to stand up to an hour with min increase in low back pain and no lateral shift    Baseline  able to stand for 10 minutes , pain is 5-6/10  Status  On-going      PT LONG TERM GOAL #6   Title  Pt will be able to negotiate steps without minimal pain    Baseline  unable to negotiate steps without pain, learning how to use UE for safety due to pain    Status  On-going      PT LONG TERM GOAL #7   Title  Pt unable to lift items with proper posture and body mechanics to prevent further injury to back and prepare for work    Baseline  Pt given handout and verbally instructed , needs time and more reinforcement    Status  On-going      PT LONG TERM GOAL #8   Title  "Pt will not wake due to pain while turning  in bed while sleeping at night    Baseline  Pt continues to wake up during the night with right back and hip pain whenever turning.     Status  On-going            Plan - 10/24/17 1014    Clinical Impression Statement  Pt continues at 5-6/10. in back and bil hips with right hip 6/10. Pt with severe limitation of Right hip with limited IR motion, with bony end feel.  Stiff hip pain does not allow for proper hip association and has excessive  increased movement of lumbar spine. Pt would benefit from MD evaluating Right hip pain.   Pt with weak posterior chain muscles, of hip ext and hip abduction  and added to HEP .  Will continue toward completion of goals. for core strength for back pain but hip pain  does not  improve and AROM not significantly improved so far.  Pt scheduled for MD visit next week and right hip of pt  will benefit from evaluation.    Rehab Potential  Good    PT Frequency  2x / week    PT Duration  6 weeks    PT Treatment/Interventions  ADLs/Self Care Home Management;Cryotherapy;Electrical Stimulation;Iontophoresis 4mg /ml Dexamethasone;Moist Heat;Traction;Neuromuscular re-education;Therapeutic exercise;Therapeutic activities;Stair training;Functional mobility training;Patient/family education;Manual techniques;Passive range of motion;Dry needling;Taping    PT Next Visit Plan  Modalites.   Try [place]/ traction.  basic back  Please assess MMT of LE if able to tolerate, extremely tight right hip continue stretch,  reviev HEP    PT Home Exercise Plan  standing pelvic tilt, lumbar extension, sciatic nerve stretch in sitting,  arm opener, prone press ups, Hip flexion on Right stretch. standing hip flex. abd and ext.  trunk rotation, SKTC and piriformis stretch. standing back extension.    Consulted and Agree with Plan of Care  Patient       Patient will benefit from skilled therapeutic intervention in order to improve the following deficits and impairments:  Pain, Obesity,  Improper body mechanics, Postural dysfunction, Impaired flexibility, Increased muscle spasms, Increased fascial restricitons, Decreased range of motion, Decreased mobility, Hypomobility, Decreased strength  Visit Diagnosis: Bilateral low back pain with right-sided sciatica, unspecified chronicity  Muscle weakness (generalized)  Muscle spasm of right lower extremity  Stiffness of right hip, not elsewhere classified  Abnormal posture     Problem List Patient Active Problem List   Diagnosis Date Noted  . Dilated aortic root ([place])   . Chest pain 07/27/2017  . Palpitations 07/26/2017  . ERECTILE DYSFUNCTION, MILD 11/12/2007  . TOBACCO ABUSE 11/12/2007    Voncille Lo, PT Certified Exercise Expert for the Aging Adult  10/24/17 1:24 PM  Phone: [phone] Fax: [facility] [address] [place], [place], [address] Phone: [phone]   Fax:  [phone]  Name: Blake [name] MRN: [id number] Date of Birth: 01/11/1971

## 2017-10-24 NOTE — Patient Instructions (Addendum)
   Copyright  VHI. All rights reserved.   Pelvic Tilt   Flatten back by tightening stomach muscles and buttocks. Repeat _10___ times per set. Do __2__ sets per session. Do _2___ sessions per day.  http://orth.exer.us/134   Copyright  VHI. All rights reserved. Knee to Chest (Flexion)   Pull knee toward chest. Feel stretch in lower back or buttock area. Breathing deeply, Hold _10___ seconds. Repeat with other knee. Repeat _5___ times. Do __2__ sessions per day.  http://gt2.exer.us/225   Copyright  VHI. All rights reserved.   Lower Trunk Rotation Stretch   Keeping back flat and feet together, rotate knees to left side. Hold _10___ seconds. Repeat __10__ times per set. Do __1__ sets per session. Do _2___ sessions per day.  http://orth.exer.us/122   Copyright  VHI. All rights reserved.  Supine: Leg Stretch With Strap (Basic)   Lie on back with one knee bent, foot flat on floor. Hook strap around other foot. Straighten knee. Keep knee level with other knee. Hold 30___ seconds. Relax leg completely down to floor.  Repeat _3__ times per session. Do __2_ sessions per day.  Copyright  VHI. All rights reserved.  Standing Arch (Extension)   Place hands in small of back. Using hands as fulcrum, arch backward. Try to keep knees straight. Great exercise if sitting makes pain worse. Use to break up long periods of sitting. Repeat __5__ times 5-10 sec. Do _3___ sessions per day.  Knee High   Holding stable object, raise knee to hip level, then lower knee. Repeat with other knee. Complete __15_ repetitions. Do __2__ sessions per day.  ABDUCTION: Standing (Active)   Stand, feet flat. Lift right leg out to side. Use _0__ lbs. Complete __15_ repetitions. Perform __2_ sessions per day.    EXTENSION: Standing (Active)  Stand, both feet flat. Draw right leg behind body as far as possible. Use 0___ lbs. Complete 15 repetitions. Perform __2_ sessions per day.  Copyright  VHI.  All rights reserved.     Voncille Lo, PT Certified Exercise Expert for the Aging Adult  10/24/17 9:57 AM Phone: [phone] Fax: [phone]

## 2017-10-26 ENCOUNTER — Ambulatory Visit: Payer: Medicaid Other | Admitting: Physical Therapy

## 2017-10-30 ENCOUNTER — Encounter (INDEPENDENT_AMBULATORY_CARE_PROVIDER_SITE_OTHER): Payer: Self-pay | Admitting: Physician Assistant

## 2017-10-30 ENCOUNTER — Ambulatory Visit (INDEPENDENT_AMBULATORY_CARE_PROVIDER_SITE_OTHER): Payer: Medicaid Other | Admitting: Physician Assistant

## 2017-10-30 ENCOUNTER — Other Ambulatory Visit: Payer: Self-pay

## 2017-10-30 VITALS — BP 133/93 | HR 88 | Temp 98.2°F | Ht 74.0 in | Wt 375.2 lb

## 2017-10-30 DIAGNOSIS — M25551 Pain in right hip: Secondary | ICD-10-CM

## 2017-10-30 DIAGNOSIS — G8929 Other chronic pain: Secondary | ICD-10-CM

## 2017-10-30 DIAGNOSIS — M5441 Lumbago with sciatica, right side: Secondary | ICD-10-CM

## 2017-10-30 DIAGNOSIS — R400 Somnolence: Secondary | ICD-10-CM

## 2017-10-30 MED ORDER — CYCLOBENZAPRINE HCL 10 MG PO TABS: 10.0000 mg | ORAL_TABLET | Freq: Every day | ORAL | 0 refills | Status: DC

## 2017-10-30 MED ORDER — MELOXICAM 15 MG PO TABS: 15.0000 mg | ORAL_TABLET | Freq: Every day | ORAL | 0 refills | Status: DC

## 2017-10-30 MED ORDER — GABAPENTIN 600 MG PO TABS: 1200.0000 mg | ORAL_TABLET | Freq: Three times a day (TID) | ORAL | 3 refills | Status: DC
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
## 2017-10-30 NOTE — Patient Instructions (Signed)

## 2017-10-31 ENCOUNTER — Ambulatory Visit: Payer: Medicaid Other | Admitting: Physical Therapy

## 2017-11-02 ENCOUNTER — Ambulatory Visit: Payer: Medicaid Other | Admitting: Physical Therapy

## 2017-11-09 ENCOUNTER — Ambulatory Visit: Payer: Medicaid Other | Attending: Physician Assistant | Admitting: Physical Therapy

## 2017-11-09 ENCOUNTER — Encounter: Payer: Self-pay | Admitting: Physical Therapy

## 2017-11-09 DIAGNOSIS — M5441 Lumbago with sciatica, right side: Secondary | ICD-10-CM | POA: Diagnosis not present

## 2017-11-09 DIAGNOSIS — R293 Abnormal posture: Secondary | ICD-10-CM | POA: Insufficient documentation

## 2017-11-09 DIAGNOSIS — M25651 Stiffness of right hip, not elsewhere classified: Secondary | ICD-10-CM | POA: Diagnosis present

## 2017-11-09 DIAGNOSIS — M6281 Muscle weakness (generalized): Secondary | ICD-10-CM | POA: Insufficient documentation

## 2017-11-09 DIAGNOSIS — M62838 Other muscle spasm: Secondary | ICD-10-CM | POA: Insufficient documentation

## 2017-11-09 NOTE — Therapy (Signed)
[place], [place], [address] Phone: [phone]   Fax:  [phone]  Physical Therapy Treatment/Recertification  Patient Details  Name: Blake [name] MRN: [id number] Date of Birth: 09/07/1970 Referring Provider: Clent Demark MD   Encounter Date: 11/09/2017  PT End of Session - 11/09/17 1022    Visit Number  6    Number of Visits  16    Date for PT Re-Evaluation  12/07/17    Authorization Type  Medicaid (authorized until 12-03-17    Authorization Time Period  10/23/17 throuch 12-03-17    Authorization - Visit Number  6    Authorization - Number of Visits  16    PT Start Time  1017    PT Stop Time  1100    PT Time Calculation (min)  43 min    Activity Tolerance  Patient tolerated treatment well    Behavior During Therapy  [facility] for tasks assessed/performed       Past Medical History:  Diagnosis Date  . Arthritis   . Back pain   . Dilated aortic root (HCC)    39mm ascending aorta by echo 09/2017  . ERECTILE DYSFUNCTION, MILD 11/12/2007   Qualifier: Diagnosis of  By: Carolyne Littles    . TOBACCO ABUSE 11/12/2007   Qualifier: Diagnosis of  By: Carmie End MD, Junie Panning      Past Surgical History:  Procedure Laterality Date  . FACIAL FRACTURE SURGERY      There were no vitals filed for this visit.  Subjective Assessment - 11/09/17 1028    Subjective  5-6/10 pain in my right hip and back    Pertinent History  obesity,  tic, ( head shaking)    Limitations  Sitting;Standing;Walking    How long can you sit comfortably?  10-15 minutes    How long can you stand comfortably?  10 minutes    How long can you walk comfortably?  10 minutes limited by pain    Diagnostic tests  x ray    Patient Stated Goals  ease pain,  would like to walk for exercises,    Currently in Pain?  Yes    Pain Score  6     Pain Location  Back    Pain Orientation  Lower    Pain Descriptors / Indicators  Aching;Tightness    Pain Type  Chronic  pain    Pain Onset  More than a month ago    Pain Frequency  Intermittent    Multiple Pain Sites  Yes    Pain Score  6    Pain Location  Hip    Pain Orientation  Right;Left right worse than left    Pain Descriptors / Indicators  Aching;Tightness    Pain Type  Chronic pain    Pain Onset  More than a month ago         [facility] PT Assessment - 11/09/17 0001      Assessment   Medical Diagnosis  chronic low back pain with right sciatica pt with Right hip pain most     Referring Provider  Clent Demark      AROM   Overall AROM   Deficits    Overall AROM Comments  --    Right Hip Flexion  95 pain 6-7/10 limiting AROM    Right Hip External Rotation   25    Right Hip Internal Rotation   -10 pain and hard endfeel  Left Hip Flexion  120    Left Hip External Rotation   35 no pain    Left Hip Internal Rotation   20 no pain    Lumbar Flexion  60    Lumbar Extension  15 pain on right low back    Lumbar - Right Side Bend  18 pain on right low back/ hip    Lumbar - Left Side Bend  20 pain on right    Lumbar - Right Rotation  50 % available    Lumbar - Left Rotation  50% available      Strength   Overall Strength  Deficits    Right Hip Flexion  3/5 severely limited by pain    Right Hip Extension  3/5 severely limited by pain    Right Hip External Rotation   3/5 severely limited by pain    Right Hip ABduction  4-/5    Left Hip Flexion  4/5    Left Hip Extension  4/5    Left Hip External Rotation  4/5    Left Hip ABduction  4/5      Flexibility   Hamstrings  52 on right and left 70      Palpation   Palpation comment  Pt with hard endfeel with right ER of hip.  Pt unable to come to neutral in hip extension. TTP to right QL, lumbar and gluteals      FABER test   findings  Positive    Side  Right    Comment  Pt with limited IR and ER in hip      Saralyn Pilar [facility]) Test   Findings  Positive    Side  Right    Comments  increases back pain into right buttock                    [facility] Adult PT Treatment/Exercise - 11/09/17 1035      Self-Care   Self-Care  Lifting;ADL's    ADL's  worked on ADL's modifications to don/doff shoes.  unable to bring right ankle to knee    Lifting  demo of lifting 25 lb object.  bending knees but with 5-6/10 pain in back/hip.  Hip major limiting factor      Lumbar Exercises: Stretches   Single Knee to Chest Stretch  Right;Left;5 reps;10 seconds right hip pain limiting     Pelvic Tilt  10 reps;10 seconds    Other Lumbar Stretch Exercise  prone hip flexions stretch on elbows for 75minutes with pelvic press    Other Lumbar Stretch Exercise  prone press up x 5,  pain improved but stil at 5/10      Lumbar Exercises: Standing   Other Standing Lumbar Exercises  standing hip flex, ext and abd 15 each on right for strengthening      Lumbar Exercises: Supine   Ab Set  10 reps;5 seconds    Pelvic Tilt  10 reps    Clam  10 reps with red t band    Bent Knee Raise  10 reps bil  VC for TrA activation    Bridge Limitations  5 x bridge with pain in low back right    Other Supine Lumbar Exercises  decompression exercises decompression position, shoulder press 2-3 sec 5 x, head press 2-3 sec 5 x, leg lengthener r and left 2x 2-3 sec and then 6 x, leg press 2-3 sec right and left each 5 x  PT Education - 11/09/17 1027    Education provided  Yes    Education Details  Spinal decompression ex. and basic Ab setting , TrA control    Person(s) Educated  Patient    Methods  Explanation;Demonstration;Tactile cues;Verbal cues;Handout    Comprehension  Verbalized understanding;Returned demonstration       PT Short Term Goals - 11/09/17 1047      PT SHORT TERM GOAL #1   Title  STG= LTG        PT Long Term Goals - 11/09/17 1051      PT LONG TERM GOAL #1   Title  Pt will be able to demo safe and proper lifting techniques to prevent further injury.     Baseline  Pt was reinforced ADL's and demo lifting  technique patient is able to verbally explain proper way to perform ADL'and attempts proper lifting but is limited by 5 to 6 /10 pain    Status  On-going    Target Date  12/07/17      PT LONG TERM GOAL #2   Title  Pt will be able to sit for 30 min in order to be able to watch TV and to eat a meal with min discomfort    Baseline  Pt cannot sit longer than 10-15 minutes due to pain, must shift weight    Status  On-going    Target Date  12/07/17      PT LONG TERM GOAL #3   Title  Pt will be able to don socks on Right LE with min pain    Baseline  contniues to have limitation and hard end feel in right hip unable to bring heel  to right knee to don clothing    Status  On-going    Target Date  12/07/17      PT LONG TERM GOAL #4   Title  Pt will be able to demo 4+/5 strength in  LE grossly for improved function, and possible return to work function    Baseline  Right hip extension 3/5 today, hard end feel, hip abd 3/5     Status  On-going    Target Date  12/07/17      PT LONG TERM GOAL #5   Title  Pt will be able to stand up to an hour with min increase in low back pain and no lateral shift    Baseline  able to stand for 10 minutes , pain is 5-6/10    Status  On-going    Target Date  12/07/17      PT LONG TERM GOAL #6   Title  Pt will be able to negotiate steps with minimal pain    Baseline  unable to negotiate steps without pain, learning how to use UE for safety due to pain    Status  On-going    Target Date  12/07/17      PT LONG TERM GOAL #7   Title  Pt will be able to lift items with proper posture and body mechanics to prevent further injury to back and prepare for work    Baseline  Pt demonstrated lifting of chair 20 lb but c/o 5-6/10 pain even while perfoming with proper posture    Status  On-going    Target Date  12/07/17      PT LONG TERM GOAL #8   Title  "Pt will not wake due to pain while turning in bed while sleeping at night  Baseline  Pt continues to wake up  during the night with right back and hip pain whenever turning.     Status  On-going    Target Date  12/07/17            Plan - 11/09/17 1131    Clinical Impression Statement  Pt went to the MD for an x ray of right hip scheduled today after PT.  Pain remains at 5to 6/10 in right hip and right low back.  Pt has severe limitation of right hip and limited IR motion with bony end feel.  Stiff hip pain does not allow for proper hip dissociation and causes excessive motion of the spine.  Pt does try to participate with exericses but he has weak posterior chain muscles expecially on right hip at 3/5 only improved from3-/5 since evaluation. Pt has been educated on ADL;s , posture and lifting and pt verbalized understanding but lifting even 20 lb with limtation causees 5 to 6/10 pain and sometimes higher.  Pt is  also limited in his abilty to progress exercises.  Will await finding of MD with x ray.  Pt has 5 visit remaining at PT but pain is severely limiting his full participation in PT althought he does try to be compliant.      Rehab Potential  Good    PT Frequency  2x / week    PT Duration  6 weeks    PT Treatment/Interventions  ADLs/Self Care Home Management;Cryotherapy;Electrical Stimulation;Iontophoresis 4mg /ml Dexamethasone;Moist Heat;Traction;Neuromuscular re-education;Therapeutic exercise;Therapeutic activities;Stair training;Functional mobility training;Patient/family education;Manual techniques;Passive range of motion;Dry needling;Taping    PT Next Visit Plan  Modalites.   Try [place]/ traction.  basic back  Please assess MMT of LE if able to tolerate, extremely tight right hip continue stretch,  decompression exercises, basic TrA ab set and pelvic tilt, bent knee raise and  supine clam abd activation.    PT Home Exercise Plan  standing pelvic tilt, lumbar extension, sciatic nerve stretch in sitting,  arm opener, prone press ups, Hip flexion on Right stretch. standing hip flex. abd and ext.   trunk rotation, SKTC and piriformis stretch. standing back extension.    Consulted and Agree with Plan of Care  Patient       Patient will benefit from skilled therapeutic intervention in order to improve the following deficits and impairments:  Pain, Obesity, Improper body mechanics, Postural dysfunction, Impaired flexibility, Increased muscle spasms, Increased fascial restricitons, Decreased range of motion, Decreased mobility, Hypomobility, Decreased strength  Visit Diagnosis: Bilateral low back pain with right-sided sciatica, unspecified chronicity - Plan: PT plan of care cert/re-cert  Muscle weakness (generalized) - Plan: PT plan of care cert/re-cert  Muscle spasm of right lower extremity - Plan: PT plan of care cert/re-cert  Stiffness of right hip, not elsewhere classified - Plan: PT plan of care cert/re-cert  Abnormal posture - Plan: PT plan of care cert/re-cert     Problem List Patient Active Problem List   Diagnosis Date Noted  . Dilated aortic root ([place])   . Chest pain 07/27/2017  . Palpitations 07/26/2017  . ERECTILE DYSFUNCTION, MILD 11/12/2007  . TOBACCO ABUSE 11/12/2007   Voncille Lo, PT Certified Exercise Expert for the Aging Adult  11/09/17 6:29 PM Phone: [phone] Fax: [place] [facility] [address] [place], [place], [address] Phone: [phone]   Fax:  [phone]  Name: Blake [name] MRN: [id number] Date of Birth: [birth year]

## 2017-11-09 NOTE — Patient Instructions (Addendum)
These exercises are for you when you feel your back is inflamed.   Given handout of spinal decompression exericses 1) decomp position for 5 minutes 2)shoulder press  3) head press, 4) leg lengthener  5) Leg  Press    PELVIC TILT  Lie on back, legs bent. Exhale, tilting top of pelvis back, pubic bone up, to flatten lower back. Inhale, rolling pelvis opposite way, top forward, pubic bone down, arch in back. Repeat __10__ times. Do __2__ sessions per day. Copyright  VHI. All rights reserved.    Isometric Hold With Pelvic Floor (Hook-Lying)  Lie with hips and knees bent. Slowly inhale, and then exhale. Pull navel toward spine and tighten pelvic floor. Hold for __10_ seconds. Continue to breathe in and out during hold. Rest for _10__ seconds. Repeat __10_ times. Do __2-3_ times a day.   Knee Fold  Lie on back, legs bent, arms by sides. Exhale, lifting knee to chest. Inhale, returning. Keep abdominals flat, navel to spine. Repeat __10__ times, alternating legs. Do __2__ sessions per day.  Knee Drop  Keep pelvis stable. Without rotating hips, slowly drop knee to side, pause, return to center, bring knee across midline toward opposite hip. Feel obliques engaging. Repeat for ___10_ times each leg.   Press Up (Extension)    Place hands in a position for a half push-up. Press top half of body upward using arms. Let lower back sag. Hold __5__ seconds. Lower body. Repeat _5___ times. Do ___2 sessions per day. No sharp pain radiating down leg. You can also stay on elbows for 3 minutes to stretch hip flexors.   http://gt2.exer.us/245   Copyright  VHI. All rights reserved.      Copyright  VHI. All rights reserved.  Voncille Lo, PT Certified Exercise Expert for the Aging Adult  11/09/17 10:26 AM Phone: [phone] Fax: [phone]

## 2017-11-14 ENCOUNTER — Encounter: Payer: Self-pay | Admitting: Physical Therapy

## 2017-11-14 ENCOUNTER — Ambulatory Visit: Payer: Medicaid Other | Admitting: Physical Therapy

## 2017-11-14 DIAGNOSIS — R293 Abnormal posture: Secondary | ICD-10-CM

## 2017-11-14 DIAGNOSIS — M62838 Other muscle spasm: Secondary | ICD-10-CM

## 2017-11-14 DIAGNOSIS — M5441 Lumbago with sciatica, right side: Secondary | ICD-10-CM

## 2017-11-14 DIAGNOSIS — M6281 Muscle weakness (generalized): Secondary | ICD-10-CM

## 2017-11-14 DIAGNOSIS — M25651 Stiffness of right hip, not elsewhere classified: Secondary | ICD-10-CM

## 2017-11-14 NOTE — Therapy (Addendum)
[place], [place], [address] Phone: [phone]   Fax:  [phone]  Physical Therapy Treatment/Discharge Note  Patient Details  Name: Blake [name] MRN: [id number] Date of Birth: June 19, 1971 Referring Provider: Clent Demark   Encounter Date: 11/14/2017  PT End of Session - 11/14/17 0947    Visit Number  7    Number of Visits  16    Date for PT Re-Evaluation  12/07/17    Authorization Type  Medicaid (authorized until 12-03-17    Authorization Time Period  10/23/17 throuch 12-03-17    Authorization - Visit Number  7    Authorization - Number of Visits  16    PT Start Time  [phone]    PT Stop Time  1030    PT Time Calculation (min)  48 min    Activity Tolerance  Patient limited by pain    Behavior During Therapy  [facility] for tasks assessed/performed       Past Medical History:  Diagnosis Date  . Arthritis   . Back pain   . Dilated aortic root (HCC)    51m ascending aorta by echo 09/2017  . ERECTILE DYSFUNCTION, MILD 11/12/2007   Qualifier: Diagnosis of  By: GCarolyne Littles   . TOBACCO ABUSE 11/12/2007   Qualifier: Diagnosis of  By: ECarmie EndMD, EJunie Panning     Past Surgical History:  Procedure Laterality Date  . FACIAL FRACTURE SURGERY      There were no vitals filed for this visit.  Subjective Assessment - 11/14/17 0943    Subjective  I am the same and My x ray is tomorrow.   and then the doctor will read it.  I am not changing the pain at all. Decompression sometimes helps when i am spasming.  the deompression exercises are OK.  I fell better with my legs in 90/90    Pertinent History  obesity,  tic, ( head shaking)    Limitations  Sitting;Standing;Walking    How long can you sit comfortably?  10-15 minutes i must fidget around    How long can you stand comfortably?  10 minutes I cant tolerate without moving around    How long can you walk comfortably?  10 minutes limited by pain    Currently in Pain?  Yes     Pain Score  6     Pain Location  Back    Pain Orientation  Lower    Pain Descriptors / Indicators  Aching;Tightness    Pain Onset  More than a month ago    Pain Frequency  Intermittent    Pain Score  6    Pain Location  Hip    Pain Orientation  Right;Left right > Left    Pain Descriptors / Indicators  Aching;Tightness    Pain Type  Chronic pain    Pain Onset  More than a month ago    Pain Frequency  Intermittent                       [facility] Adult PT Treatment/Exercise - 11/14/17 0949      Lumbar Exercises: Stretches   Active Hamstring Stretch  3 reps;30 seconds    Active Hamstring Stretch Limitations  with strap    Lower Trunk Rotation  5 reps;10 seconds bil      Lumbar Exercises: Supine   Ab Set  10 reps;5 seconds    Pelvic Tilt  10  reps VC    Clam  10 reps with red t band    Bent Knee Raise  10 reps bil  VC for TrA activation    Bridge Limitations  10x  and then 10 x with ball squeeze      Lumbar Exercises: Sidelying   Clam  10 reps;Left;Right      Lumbar Exercises: Quadruped   [place]/Old Horse  10 reps x2 with VCand TC    Single Arm Raise  5 reps Pt with pain in left knee, place pillow  stopped knee pain      Traction   Type of Traction  Lumbar Pt felt better with decrease in pain /stretch 3/10    Min (lbs)  90    Max (lbs)  150    Hold Time  10    Rest Time  60    Time  17               PT Short Term Goals - 11/09/17 1047      PT SHORT TERM GOAL #1   Title  STG= LTG        PT Long Term Goals - 11/09/17 1051      PT LONG TERM GOAL #1   Title  Pt will be able to demo safe and proper lifting techniques to prevent further injury.     Baseline  Pt was reinforced ADL's and demo lifting technique patient is able to verbally explain proper way to perform ADL'and attempts proper lifting but is limited by 5 to 6 /10 pain    Status  On-going    Target Date  12/07/17      PT LONG TERM GOAL #2   Title  Pt will be able to sit for 30 min  in order to be able to watch TV and to eat a meal with min discomfort    Baseline  Pt cannot sit longer than 10-15 minutes due to pain, must shift weight    Status  On-going    Target Date  12/07/17      PT LONG TERM GOAL #3   Title  Pt will be able to don socks on Right LE with min pain    Baseline  contniues to have limitation and hard end feel in right hip unable to bring heel  to right knee to don clothing    Status  On-going    Target Date  12/07/17      PT LONG TERM GOAL #4   Title  Pt will be able to demo 4+/5 strength in  LE grossly for improved function, and possible return to work function    Baseline  Right hip extension 3/5 today, hard end feel, hip abd 3/5     Status  On-going    Target Date  12/07/17      PT LONG TERM GOAL #5   Title  Pt will be able to stand up to an hour with min increase in low back pain and no lateral shift    Baseline  able to stand for 10 minutes , pain is 5-6/10    Status  On-going    Target Date  12/07/17      PT LONG TERM GOAL #6   Title  Pt will be able to negotiate steps with minimal pain    Baseline  unable to negotiate steps without pain, learning how to use UE for safety due to pain    Status  On-going  Target Date  12/07/17      PT LONG TERM GOAL #7   Title  Pt will be able to lift items with proper posture and body mechanics to prevent further injury to back and prepare for work    Baseline  Pt demonstrated lifting of chair 20 lb but c/o 5-6/10 pain even while perfoming with proper posture    Status  On-going    Target Date  12/07/17      PT LONG TERM GOAL #8   Title  "Pt will not wake due to pain while turning in bed while sleeping at night    Baseline  Pt continues to wake up during the night with right back and hip pain whenever turning.     Status  On-going    Target Date  12/07/17            Plan - 11/14/17 1022    Clinical Impression Statement  Pt not able to do xray due to transportation issues.  tomorrow or  Thursday.  Pt is 378 lb and tried lumbar mechanical traction.  Max 150 today.  Pt felt relief in 90/90 hip flex position. today.  Pt iniitally doing lumbar stretch and strengthening.  Pt stayed around 5-6/10 pain level resting or doing exercises today. Added quadriped but pt only able to tolerate cat/cow mid back stretching. unable to perform rest of single arm raise due to pain in left knee wt bearing even with pillow under knee on mat. Pt is very hypomobile.  Pt was able to tolerate sidelying clams today to progress. Pt felt 3/10 pain relief while on  lumbar traction and 90/90 position.  will need to check on time with relief after traction    Rehab Potential  Good    PT Frequency  2x / week    PT Treatment/Interventions  ADLs/Self Care Home Management;Cryotherapy;Electrical Stimulation;Iontophoresis 39m/ml Dexamethasone;Moist Heat;Traction;Neuromuscular re-education;Therapeutic exercise;Therapeutic activities;Stair training;Functional mobility training;Patient/family education;Manual techniques;Passive range of motion;Dry needling;Taping    PT Next Visit Plan  Pt has 3 schedule visits left       Patient will benefit from skilled therapeutic intervention in order to improve the following deficits and impairments:  Pain, Obesity, Improper body mechanics, Postural dysfunction, Impaired flexibility, Increased muscle spasms, Increased fascial restricitons, Decreased range of motion, Decreased mobility, Hypomobility, Decreased strength  Visit Diagnosis: Bilateral low back pain with right-sided sciatica, unspecified chronicity  Muscle weakness (generalized)  Muscle spasm of right lower extremity  Stiffness of right hip, not elsewhere classified  Abnormal posture     Problem List Patient Active Problem List   Diagnosis Date Noted  . Dilated aortic root ([place]   . Chest pain 07/27/2017  . Palpitations 07/26/2017  . ERECTILE DYSFUNCTION, MILD 11/12/2007  . TOBACCO ABUSE 11/12/2007    LVoncille Lo PT Certified Exercise Expert for the Aging Adult  11/14/17 10:43 AM Phone: [phone]: 3[facility] [address]Phone: [phone]  Fax:  [phone] Name: AERUBIEL MANASCOMRN: [id number] of Birth: 9Jul 26, 1972  PHYSICAL THERAPY DISCHARGE SUMMARY  Visits from Start of Care: 7 Current functional level related to goals / functional outcomes: unknown   Remaining deficits: Pt with stiff and decreased AROM of right hip, and low back pain which did not respond favorably to PT at last visit or traction   Education / Equipment: HEP Plan: Patient agrees to discharge.  Patient goals were not met. Patient is being  discharged due to not returning since the last visit.  ?????    Pt was to pursue further workup for back pain but was supposed to finish course of PT . Unknown why pt did not return but pt wanted to pursue further evaluation  Voncille Lo, PT Certified Exercise Expert for the Aging Adult  12/26/17 5:59 PM Phone: [phone] Fax: [phone]

## 2017-11-14 NOTE — Patient Instructions (Signed)
Abduction: Clam (Eccentric) - Side-Lying   HIP: Abduction / External Rotation (Band)   Place band around knees. Lie on side with hips and knees bent. Raise top knee up, squeezing glutes. Keep feet together. Hold 3___ seconds. Use __red______ band. _10__ reps per set, _2__ sets per day, _7__ days per week    Bridge    Lie back, legs bent. Inhale, pressing hips up. Keeping ribs in, lengthen lower back. Exhale, rolling down along spine from top. Repeat _10 x 2___ times. Do __2__ sessions per day.  Now do bridge with ball or towel roll in between knees.    Cat / Cow Flow    Inhale, press spine toward ceiling like a Halloween cat. Blowing out all your air Keeping strength in arms and abdominals, exhale to soften spine through neutral and into cow pose. Open chest and arch back. Initiate movement between cat and cow at tailbone, one vertebrae at a time. Repeat __10__ times.       http://pm.exer.us/54   Copyright  VHI. All rights reserved.   Voncille Lo, PT Certified Exercise Expert for the Aging Adult  11/14/17 10:05 AM Phone: [phone] Fax: [phone]

## 2017-11-16 ENCOUNTER — Ambulatory Visit: Payer: Medicaid Other | Admitting: Physical Therapy

## 2017-11-21 ENCOUNTER — Ambulatory Visit (HOSPITAL_COMMUNITY)
Admission: RE | Admit: 2017-11-21 | Discharge: 2017-11-21 | Disposition: A | Discharge status: Home / Self Care | Payer: Medicaid Other | Source: Ambulatory Visit | Attending: Physician Assistant | Admitting: Physician Assistant

## 2017-11-21 DIAGNOSIS — M25551 Pain in right hip: Secondary | ICD-10-CM | POA: Insufficient documentation

## 2017-11-21 DIAGNOSIS — M1611 Unilateral primary osteoarthritis, right hip: Secondary | ICD-10-CM | POA: Diagnosis not present

## 2017-11-21 DIAGNOSIS — G8929 Other chronic pain: Secondary | ICD-10-CM | POA: Diagnosis not present

## 2017-11-23 ENCOUNTER — Telehealth: Payer: Self-pay | Admitting: Physical Therapy

## 2017-11-23 ENCOUNTER — Ambulatory Visit: Payer: Medicaid Other | Admitting: Physical Therapy

## 2017-11-23 ENCOUNTER — Other Ambulatory Visit: Payer: Self-pay | Admitting: Nurse Practitioner

## 2017-11-23 DIAGNOSIS — M5441 Lumbago with sciatica, right side: Secondary | ICD-10-CM

## 2017-11-23 DIAGNOSIS — M5442 Lumbago with sciatica, left side: Secondary | ICD-10-CM

## 2017-11-23 NOTE — Telephone Encounter (Signed)
Pt called to remind pt of future appt and to remind him of cancellation policy. Pt did not show for 10:15 appt on 11-23-17.  Pt given clinic number and told to call and confirm if he plans to attend future appt or if he would like to cancel.   Voncille Lo, PT Certified Exercise Expert for the Aging Adult  11/23/17 11:17 AM Phone: [phone] Fax: [phone]

## 2017-11-24 ENCOUNTER — Telehealth (INDEPENDENT_AMBULATORY_CARE_PROVIDER_SITE_OTHER): Payer: Self-pay

## 2017-11-24 NOTE — Telephone Encounter (Signed)
-----   Message from Gildardo Pounds, NP sent at 11/23/2017 11:53 PM EDT ----- Odette Horns shows degeneration of wearing down of the bones in the hip; likely arthritic. Will refer to ortho.

## 2017-11-24 NOTE — Telephone Encounter (Signed)
Patient is aware that Xray shoes degeneration of wearing of the bones, likely arthritic. Referred to ortho. Nat Christen, CMA

## 2017-11-26 ENCOUNTER — Ambulatory Visit (HOSPITAL_BASED_OUTPATIENT_CLINIC_OR_DEPARTMENT_OTHER): Payer: Medicaid Other | Attending: Physician Assistant

## 2017-11-27 ENCOUNTER — Telehealth: Payer: Self-pay | Admitting: Physical Therapy

## 2017-11-27 NOTE — Telephone Encounter (Signed)
Mr, Strothers is pursuing additional medical evaluation on Friday with Dr Joni Fears and needs to cancel appt for this week deferring for need for further medical evaluation for hips, back after x rays taken.   Voncille Lo, PT Certified Exercise Expert for the Aging Adult  11/27/17 3:29 PM Phone: [phone] Fax: [phone]

## 2017-11-28 ENCOUNTER — Ambulatory Visit: Payer: Medicaid Other | Admitting: Physical Therapy

## 2017-11-30 ENCOUNTER — Telehealth (INDEPENDENT_AMBULATORY_CARE_PROVIDER_SITE_OTHER): Payer: Self-pay | Admitting: Physician Assistant

## 2017-11-30 ENCOUNTER — Ambulatory Visit: Payer: Medicaid Other | Admitting: Physical Therapy

## 2017-11-30 ENCOUNTER — Other Ambulatory Visit: Payer: Self-pay | Admitting: Nurse Practitioner

## 2017-11-30 DIAGNOSIS — G8929 Other chronic pain: Secondary | ICD-10-CM

## 2017-11-30 DIAGNOSIS — M5441 Lumbago with sciatica, right side: Secondary | ICD-10-CM

## 2017-11-30 DIAGNOSIS — M25551 Pain in right hip: Secondary | ICD-10-CM

## 2017-11-30 MED ORDER — MELOXICAM 15 MG PO TABS: 15.0000 mg | ORAL_TABLET | Freq: Every day | ORAL | 0 refills | Status: DC

## 2017-11-30 NOTE — Telephone Encounter (Signed)
Script has been sent.

## 2017-11-30 NOTE — Telephone Encounter (Signed)
Pt called stating he never picked up his -meloxicam (MOBIC) 15 MG tablet  From his pharmacy and now they need a new script, please follow up  Patients pharmacy-[id number] - [place], [place] [address]

## 2017-12-01 ENCOUNTER — Ambulatory Visit (INDEPENDENT_AMBULATORY_CARE_PROVIDER_SITE_OTHER): Payer: Self-pay | Admitting: Orthopaedic Surgery

## 2017-12-11 ENCOUNTER — Encounter (INDEPENDENT_AMBULATORY_CARE_PROVIDER_SITE_OTHER): Payer: Self-pay | Admitting: Physician Assistant

## 2017-12-11 ENCOUNTER — Other Ambulatory Visit: Payer: Self-pay

## 2017-12-11 ENCOUNTER — Ambulatory Visit (INDEPENDENT_AMBULATORY_CARE_PROVIDER_SITE_OTHER): Payer: Medicaid Other | Admitting: Physician Assistant

## 2017-12-11 VITALS — BP 134/85 | HR 84 | Temp 99.1°F | Ht 74.0 in | Wt 368.2 lb

## 2017-12-11 DIAGNOSIS — M5135 Other intervertebral disc degeneration, thoracolumbar region: Secondary | ICD-10-CM

## 2017-12-11 DIAGNOSIS — M47819 Spondylosis without myelopathy or radiculopathy, site unspecified: Secondary | ICD-10-CM | POA: Diagnosis not present

## 2017-12-11 DIAGNOSIS — M25551 Pain in right hip: Secondary | ICD-10-CM | POA: Diagnosis not present

## 2017-12-11 MED ORDER — NAPROXEN 500 MG PO TABS: 500.0000 mg | ORAL_TABLET | Freq: Two times a day (BID) | ORAL | 0 refills | Status: DC

## 2017-12-11 MED ORDER — TRAMADOL HCL 50 MG PO TABS: 50.0000 mg | ORAL_TABLET | Freq: Three times a day (TID) | ORAL | 0 refills | Status: DC | PRN
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
## 2017-12-11 NOTE — Patient Instructions (Signed)
Degenerative Disk Disease Degenerative disk disease is a condition caused by the changes that occur in spinal disks as you grow older. Spinal disks are soft and compressible disks located between the bones of your spine (vertebrae). These disks act like shock absorbers. Degenerative disk disease can affect the whole spine. However, the neck and lower back are most commonly affected. Many changes can occur in the spinal disks with aging, such as:  The spinal disks may dry and shrink.  Small tears may occur in the tough, outer covering of the disk (annulus).  The disk space may become smaller due to loss of water.  Abnormal growths in the bone (spurs) may occur. This can put pressure on the nerve roots exiting the spinal canal, causing pain.  The spinal canal may become narrowed.  What increases the risk?  Being overweight.  Having a family history of degenerative disk disease.  Smoking.  There is increased risk if you are doing heavy lifting or have a sudden injury. What are the signs or symptoms? Symptoms vary from person to person and may include:  Pain that varies in intensity. Some people have no pain, while others have severe pain. The location of the pain depends on the part of your backbone that is affected. ? You will have neck or arm pain if a disk in the neck area is affected. ? You will have pain in your back, buttocks, or legs if a disk in the lower back is affected.  Pain that becomes worse while bending, reaching up, or with twisting movements.  Pain that may start gradually and then get worse as time passes. It may also start after a major or minor injury.  Numbness or tingling in the arms or legs.  How is this diagnosed? Your health care provider will ask you about your symptoms and about activities or habits that may cause the pain. He or she may also ask about any injuries, diseases, or treatments you have had. Your health care provider will examine you to check  for the range of movement that is possible in the affected area, to check for strength in your extremities, and to check for sensation in the areas of the arms and legs supplied by different nerve roots. You may also have:  An X-ray of the spine.  Other imaging tests, such as MRI.  How is this treated? Your health care provider will advise you on the best plan for treatment. Treatment may include:  Medicines.  Rehabilitation exercises.  Follow these instructions at home:  Follow proper lifting and walking techniques as advised by your health care provider.  Maintain good posture.  Exercise regularly as advised by your health care provider.  Perform relaxation exercises.  Change your sitting, standing, and sleeping habits as advised by your health care provider.  Change positions frequently.  Lose weight or maintain a healthy weight as advised by your health care provider.  Do not use any tobacco products, including cigarettes, chewing tobacco, or electronic cigarettes. If you need help quitting, ask your health care provider.  Wear supportive footwear.  Take medicines only as directed by your health care provider. Contact a health care provider if:  Your pain does not go away within 1-4 weeks.  You have significant appetite or weight loss. Get help right away if:  Your pain is severe.  You notice weakness in your arms, hands, or legs.  You begin to lose control of your bladder or bowel movements.  You have   fevers or night sweats. This information is not intended to replace advice given to you by your health care provider. Make sure you discuss any questions you have with your health care provider. Document Released: 04/24/2007 Document Revised: 12/03/2015 Document Reviewed: 10/29/2013 Elsevier Interactive Patient Education  2018 Elsevier Inc.  

## 2017-12-12 ENCOUNTER — Ambulatory Visit (INDEPENDENT_AMBULATORY_CARE_PROVIDER_SITE_OTHER): Payer: Self-pay | Admitting: Orthopaedic Surgery

## 2018-01-04 ENCOUNTER — Telehealth (INDEPENDENT_AMBULATORY_CARE_PROVIDER_SITE_OTHER): Payer: Self-pay | Admitting: Physician Assistant

## 2018-01-04 NOTE — Telephone Encounter (Signed)
Patient called requesting letter that was given to him on his appointment 12-11-17 about restrictions he had for work. Patient stated that his sister has an appointment this morning and would like if she could pick the letter.  Please Follow Up  Thank You

## 2018-01-04 NOTE — Telephone Encounter (Signed)
ID provided and letter picked up by designated relative per patient. Nat Christen, CMA

## 2018-01-12 ENCOUNTER — Encounter (INDEPENDENT_AMBULATORY_CARE_PROVIDER_SITE_OTHER): Payer: Self-pay | Admitting: Orthopaedic Surgery

## 2018-01-12 ENCOUNTER — Ambulatory Visit (INDEPENDENT_AMBULATORY_CARE_PROVIDER_SITE_OTHER): Payer: Medicaid Other | Admitting: Orthopaedic Surgery

## 2018-01-12 ENCOUNTER — Other Ambulatory Visit (INDEPENDENT_AMBULATORY_CARE_PROVIDER_SITE_OTHER): Payer: Self-pay | Admitting: Radiology

## 2018-01-12 ENCOUNTER — Encounter (INDEPENDENT_AMBULATORY_CARE_PROVIDER_SITE_OTHER): Payer: Self-pay

## 2018-01-12 VITALS — BP 135/91 | HR 85 | Ht 74.0 in | Wt 360.0 lb

## 2018-01-12 DIAGNOSIS — G8929 Other chronic pain: Secondary | ICD-10-CM

## 2018-01-12 DIAGNOSIS — M545 Low back pain: Principal | ICD-10-CM

## 2018-01-12 DIAGNOSIS — M25551 Pain in right hip: Secondary | ICD-10-CM | POA: Diagnosis not present

## 2018-01-12 MED ORDER — TRAMADOL HCL 50 MG PO TABS: ORAL_TABLET | ORAL | 0 refills | Status: DC
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
## 2018-01-25 ENCOUNTER — Other Ambulatory Visit (INDEPENDENT_AMBULATORY_CARE_PROVIDER_SITE_OTHER): Payer: Self-pay | Admitting: *Deleted

## 2018-01-25 ENCOUNTER — Ambulatory Visit
Admission: RE | Admit: 2018-01-25 | Discharge: 2018-01-25 | Disposition: A | Discharge status: Home / Self Care | Payer: Medicaid Other | Source: Ambulatory Visit | Attending: Orthopaedic Surgery | Admitting: Orthopaedic Surgery

## 2018-01-25 DIAGNOSIS — G8929 Other chronic pain: Secondary | ICD-10-CM

## 2018-01-25 DIAGNOSIS — M545 Low back pain: Principal | ICD-10-CM

## 2018-01-25 DIAGNOSIS — M899 Disorder of bone, unspecified: Secondary | ICD-10-CM

## 2018-01-29 ENCOUNTER — Encounter (INDEPENDENT_AMBULATORY_CARE_PROVIDER_SITE_OTHER): Payer: Self-pay | Admitting: Orthopaedic Surgery

## 2018-01-29 ENCOUNTER — Ambulatory Visit (INDEPENDENT_AMBULATORY_CARE_PROVIDER_SITE_OTHER): Payer: Medicaid Other | Admitting: Orthopaedic Surgery

## 2018-01-29 VITALS — BP 138/117 | HR 77 | Ht 74.0 in | Wt 360.0 lb

## 2018-01-29 DIAGNOSIS — M5442 Lumbago with sciatica, left side: Secondary | ICD-10-CM

## 2018-01-29 DIAGNOSIS — M5441 Lumbago with sciatica, right side: Secondary | ICD-10-CM

## 2018-01-29 DIAGNOSIS — G8929 Other chronic pain: Secondary | ICD-10-CM | POA: Diagnosis not present

## 2018-01-29 MED ORDER — TRAMADOL HCL 50 MG PO TABS: ORAL_TABLET | ORAL | 0 refills | Status: DC

## 2018-01-29 NOTE — Progress Notes (Signed)
Office Visit Note   Patient: Blake [name]           Date of Birth: 06-30-1971           MRN: [id number] Visit Date: 01/29/2018              Requested by: Clent Demark, PA-C [place], [place] [address] PCP: Clent Demark, PA-C   Assessment & Plan: Visit Diagnoses:  1. Chronic bilateral low back pain with bilateral sciatica     Plan: MRI scan of lumbar spine demonstrates a soft tissue mass near the conus medullaris.  Radiologist felt that he needed my scan with contrast to further alleviate the mass.  We will schedule that.  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  Renew tramadol . office after MRI with contrast  Follow-Up Instructions: Return after MRI L-S spine with contrast.   Orders:  No orders of the defined types were placed in this encounter.  No orders of the defined types were placed in this encounter.     Procedures: No procedures performed   Clinical Data: No additional findings.   Subjective: No chief complaint on file. Mr. Blake [name] has a chronic problem with his back and right hip as previously outlined.  He has difficulty when he stands for any length of time with onset of low back pain and some weakness and tingling in both lower extremities.  He also has some localized tenderness in the area of his right groin.  Plain films demonstrate osteoarthritis of the right hip.  MRI scan without contrast demonstrates a soft tissue mass near the conus medullaris.  Radiology would like to have this study performed with contrast before they make a definitive diagnosis.  We will schedule that.  He might be a candidate for an epidural steroid injection.  Eventually we need to consider a total hip replacement.  Mr. Blake [name] is not diabetic.  HPI  Review of Systems   Objective: Vital Signs: BP (!) 138/117 (BP Location: Right Arm, Patient Position: Sitting, Cuff Size: Normal)   Pulse 77   Ht 6\' 2"  (1.88 m)   Wt  (!) 360 lb (163.3 kg)   BMI 46.22 kg/m   Physical Exam  Constitutional: He is oriented to person, place, and time. He appears well-developed and well-nourished.  HENT:  Mouth/Throat: Oropharynx is clear and moist.  Eyes: Pupils are equal, round, and reactive to light. EOM are normal.  Pulmonary/Chest: Effort normal.  Neurological: He is alert and oriented to person, place, and time.  Skin: Skin is warm and dry.  Psychiatric: He has a normal mood and affect. His behavior is normal.    Ortho Exam awake alert and oriented x3.  Comfortable sitting painful range of motion with internal/external rotation of his right hip relating to the osteoarthritis.  Straight leg raise is negative.  No distal edema.  +1 pulses Specialty Comments:  No specialty comments available.  Imaging: No results found.   PMFS History: Patient Active Problem List   Diagnosis Date Noted  . Dilated aortic root ([place])   . Chest pain 07/27/2017  . Palpitations 07/26/2017  . ERECTILE DYSFUNCTION, MILD 11/12/2007  . TOBACCO ABUSE 11/12/2007   Past Medical History:  Diagnosis Date  . Arthritis   . Back pain   . Dilated aortic root ([facility])    16mm ascending aorta by echo 09/2017  . ERECTILE DYSFUNCTION, MILD 11/12/2007   Qualifier: Diagnosis of  By: Carolyne Littles    . TOBACCO ABUSE 11/12/2007   Qualifier: Diagnosis of  By: Carmie End MD, Erin      Family History  Problem Relation Age of Onset  . COPD Mother   . Leukemia Father   . Hypertension Sister   . Diabetes Sister     Past Surgical History:  Procedure Laterality Date  . FACIAL FRACTURE SURGERY     Social History   Occupational History  . Not on file  Tobacco Use  . Smoking status: Current Every Day Smoker    Packs/day: 0.25    Years: 30.00    Pack years: 7.50  . Smokeless tobacco: Never Used  . Tobacco comment: down to 3-4 cigarettes a day   Substance and Sexual Activity  . Alcohol use: Yes    Comment: Drink on weekends  . Drug use: No  . Sexual  activity: Not Currently     Blake Balding, MD   Note - This record has been created using [employer].  Chart creation errors have been sought, but may not always  have been located. Such creation errors do not reflect on  the standard of medical care.

## 2018-02-04 ENCOUNTER — Ambulatory Visit
Admission: RE | Admit: 2018-02-04 | Discharge: 2018-02-04 | Disposition: A | Discharge status: Home / Self Care | Payer: Medicaid Other | Source: Ambulatory Visit | Attending: Orthopaedic Surgery | Admitting: Orthopaedic Surgery

## 2018-02-04 DIAGNOSIS — M899 Disorder of bone, unspecified: Secondary | ICD-10-CM

## 2018-02-04 MED ORDER — GADOBENATE DIMEGLUMINE 529 MG/ML IV SOLN
20.0000 mL | Freq: Once | INTRAVENOUS | Status: AC | PRN
  Administered 2018-02-04: 20 mL via INTRAVENOUS

## 2018-02-05 ENCOUNTER — Ambulatory Visit (INDEPENDENT_AMBULATORY_CARE_PROVIDER_SITE_OTHER): Payer: Medicaid Other | Admitting: Physician Assistant

## 2018-02-05 ENCOUNTER — Encounter (INDEPENDENT_AMBULATORY_CARE_PROVIDER_SITE_OTHER): Payer: Self-pay | Admitting: Physician Assistant

## 2018-02-05 ENCOUNTER — Other Ambulatory Visit: Payer: Self-pay

## 2018-02-05 VITALS — BP 135/90 | HR 62 | Temp 98.1°F | Ht 74.0 in | Wt 371.0 lb

## 2018-02-05 DIAGNOSIS — M5442 Lumbago with sciatica, left side: Secondary | ICD-10-CM

## 2018-02-05 DIAGNOSIS — D1779 Benign lipomatous neoplasm of other sites: Secondary | ICD-10-CM | POA: Diagnosis not present

## 2018-02-05 DIAGNOSIS — M47819 Spondylosis without myelopathy or radiculopathy, site unspecified: Secondary | ICD-10-CM

## 2018-02-05 DIAGNOSIS — Z720 Tobacco use: Secondary | ICD-10-CM | POA: Diagnosis not present

## 2018-02-05 DIAGNOSIS — M5135 Other intervertebral disc degeneration, thoracolumbar region: Secondary | ICD-10-CM

## 2018-02-05 DIAGNOSIS — Z23 Encounter for immunization: Secondary | ICD-10-CM

## 2018-02-05 DIAGNOSIS — M25551 Pain in right hip: Secondary | ICD-10-CM

## 2018-02-05 DIAGNOSIS — E882 Lipomatosis, not elsewhere classified: Secondary | ICD-10-CM

## 2018-02-05 DIAGNOSIS — M5441 Lumbago with sciatica, right side: Secondary | ICD-10-CM

## 2018-02-05 DIAGNOSIS — G8929 Other chronic pain: Secondary | ICD-10-CM

## 2018-02-05 MED ORDER — NAPROXEN 500 MG PO TABS: 500.0000 mg | ORAL_TABLET | Freq: Two times a day (BID) | ORAL | 0 refills | Status: DC

## 2018-02-05 MED ORDER — DULOXETINE HCL 60 MG PO CPEP: 60.0000 mg | ORAL_CAPSULE | Freq: Every day | ORAL | 11 refills | Status: DC

## 2018-02-05 MED ORDER — GABAPENTIN 600 MG PO TABS: 1200.0000 mg | ORAL_TABLET | Freq: Three times a day (TID) | ORAL | 3 refills | Status: DC

## 2018-02-05 MED ORDER — VARENICLINE TARTRATE 0.5 MG X 11 & 1 MG X 42 PO MISC: ORAL | 0 refills | Status: DC

## 2018-02-05 NOTE — Progress Notes (Signed)
Subjective:  Patient ID: Blake [name], male    DOB: 1971-01-23  Age: 47 y.o. MRN: [id number]  CC: f/u LBP  HPI Blake [name] a 47 y.o.malewith a medical history of hives, LBP, cervical strainand tobacco abuse presents to f/u on back pain and hip pain. Orthopedic specialist feels he is a candidate for an epidural steroid injection to the back. Pt also being considered for a total hip replacement. 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 Pt continues with severe pain which is affecting his ADLs to a great degree. He is currently not working due to pain. Would like to proceed with surgery and will be seeing his orthopedic specialist on f/u soon.      Pt is still smoking and would like to start on Chantix again. He is aware that he needs to stop smoking to have a better chance at recovery if surgery is performed.      Outpatient Medications Prior to Visit  Medication Sig Dispense Refill  . gabapentin (NEURONTIN) 600 MG tablet Take 2 tablets (1,200 mg total) by mouth 3 (three) times daily. 180 tablet 3  . propranolol ER (INDERAL LA) 60 MG 24 hr capsule Take 1 capsule (60 mg total) by mouth daily. 30 capsule 11  . traMADol (ULTRAM) 50 MG tablet Take 1 tablet (50 mg total) by mouth every 8 (eight) hours as needed. 9 tablet 0  . naproxen (NAPROSYN) 500 MG tablet Take 1 tablet (500 mg total) by mouth 2 (two) times daily with a meal. (Patient not taking: Reported on 02/05/2018) 30 tablet 0  . traMADol (ULTRAM) 50 MG tablet Take 1-2 tab po bid (Patient not taking: Reported on 02/05/2018) 30 tablet 0  . varenicline (CHANTIX STARTING MONTH PAK) 0.5 MG X 11 & 1 MG X 42 tablet Take one 0.5 mg tablet by mouth once daily for 3 days, then increase to one 0.5 mg tablet twice daily for 4 days, then increase to one 1 mg  tablet twice daily. (Patient not taking: Reported on 02/05/2018) 53 tablet 0  . cyclobenzaprine (FLEXERIL) 10 MG tablet Take 1 tablet (10 mg total) by mouth at bedtime. (Patient not taking: Reported on 01/29/2018) 30 tablet 0   No facility-administered medications prior to visit.      ROS Review of Systems  Constitutional: Negative for chills, fever and malaise/fatigue.  Eyes: Negative for blurred vision.  Respiratory: Negative for shortness of breath.   Cardiovascular: Negative for chest pain and palpitations.  Gastrointestinal: Negative for abdominal pain and nausea.  Genitourinary: Negative for dysuria and hematuria.  Musculoskeletal: Negative for joint pain and myalgias.  Skin: Negative for rash.  Neurological: Negative for tingling and headaches.  Psychiatric/Behavioral: Negative for depression. The patient is not nervous/anxious.     Objective:  BP 135/90 (BP Location: Left Arm, Patient Position: Sitting, Cuff Size: Large)   Pulse 62   Temp 98.1 F (36.7 C) (Oral)   Ht 6\' 2"  (1.88 m)   Wt (!) 371 lb (168.3 kg)   SpO2 94%   BMI 47.63 kg/m   BP/Weight 02/05/2018 9/47/6546 5/0/3546  Systolic BP 568 127 517  Diastolic BP 90 001 91  Wt. (Lbs) 371 360 360  BMI 47.63 46.22 46.22      Physical Exam  Constitutional: He is oriented to person, place, and time.  Well developed,  obese, in apparent discomfort/pain 2/2 back pain, polite  HENT:  Head: Normocephalic and atraumatic.  Eyes: No scleral icterus.  Neck: Normal range of motion. Neck supple. No thyromegaly present.  Cardiovascular: Normal rate, regular rhythm and normal heart sounds.  Pulmonary/Chest: Effort normal and breath sounds normal.  Musculoskeletal: He exhibits no edema.  No provacative testing or ROM testing performed.  Neurological: He is alert and oriented to person, place, and time.  Skin: Skin is warm and dry. No rash noted. No erythema. No pallor.  Psychiatric: He has a normal mood and affect. His  behavior is normal. Thought content normal.  Vitals reviewed.    Assessment & Plan:    1. Epidural lipomatosis - Ambulatory referral to Pain Clinic - Ambulatory referral to Neurosurgery  2. Chronic bilateral low back pain with bilateral sciatica - Ambulatory referral to Pain Clinic - Ambulatory referral to Neurosurgery - gabapentin (NEURONTIN) 600 MG tablet; Take 2 tablets (1,200 mg total) by mouth 3 (three) times daily.  Dispense: 180 tablet; Refill: 3  3. DDD (degenerative disc disease), thoracolumbar - Refillnaproxen (NAPROSYN) 500 MG tablet; Take 1 tablet (500 mg total) by mouth 2 (two) times daily with a meal.  Dispense: 30 tablet; Refill: 0  4. Facet arthropathy - Refill naproxen (NAPROSYN) 500 MG tablet; Take 1 tablet (500 mg total) by mouth 2 (two) times daily with a meal.  Dispense: 30 tablet; Refill: 0  5. Right hip pain - Refill naproxen (NAPROSYN) 500 MG tablet; Take 1 tablet (500 mg total) by mouth 2 (two) times daily with a meal.  Dispense: 30 tablet; Refill: 0  6. Need for Tdap vaccination - Tdap vaccine greater than or equal to 7yo IM  7. Tobacco abuse - Begin varenicline (CHANTIX STARTING MONTH PAK) 0.5 MG X 11 & 1 MG X 42 tablet; Take one 0.5 mg tablet by mouth once daily for 3 days, then increase to one 0.5 mg tablet twice daily for 4 days, then increase to one 1 mg tablet twice daily.  Dispense: 53 tablet; Refill: 0      Meds ordered this encounter  Medications  . varenicline (CHANTIX STARTING MONTH PAK) 0.5 MG X 11 & 1 MG X 42 tablet    Sig: Take one 0.5 mg tablet by mouth once daily for 3 days, then increase to one 0.5 mg tablet twice daily for 4 days, then increase to one 1 mg tablet twice daily.    Dispense:  53 tablet    Refill:  0    Order Specific Question:   Supervising Provider    Answer:   Charlott Rakes [[id number]]  . gabapentin (NEURONTIN) 600 MG tablet    Sig: Take 2 tablets (1,200 mg total) by mouth 3 (three) times daily.    Dispense:  180  tablet    Refill:  3    Order Specific Question:   Supervising Provider    Answer:   Charlott Rakes [[id number]]  . naproxen (NAPROSYN) 500 MG tablet    Sig: Take 1 tablet (500 mg total) by mouth 2 (two) times daily with a meal.    Dispense:  30 tablet    Refill:  0    Order Specific Question:   Supervising Provider    Answer:   Charlott Rakes [[id number]]  . DULoxetine (CYMBALTA) 60 MG capsule    Sig: Take 1 capsule (60 mg total) by mouth daily.    Dispense:  30 capsule    Refill:  11    Order  Specific Question:   Supervising Provider    Answer:   Charlott Rakes [[id number]]    Follow-up: Return in about 8 weeks (around 04/02/2018) for Cymbalta f/u.   Clent Demark PA

## 2018-02-05 NOTE — Patient Instructions (Signed)

## 2018-02-13 ENCOUNTER — Encounter (INDEPENDENT_AMBULATORY_CARE_PROVIDER_SITE_OTHER): Payer: Self-pay

## 2018-02-13 ENCOUNTER — Encounter (INDEPENDENT_AMBULATORY_CARE_PROVIDER_SITE_OTHER): Payer: Self-pay | Admitting: Orthopaedic Surgery

## 2018-02-13 ENCOUNTER — Ambulatory Visit (INDEPENDENT_AMBULATORY_CARE_PROVIDER_SITE_OTHER): Payer: Self-pay | Admitting: Orthopaedic Surgery

## 2018-02-13 VITALS — BP 142/96 | HR 74 | Resp 16 | Ht 75.0 in | Wt 370.0 lb

## 2018-02-13 DIAGNOSIS — Z6841 Body Mass Index (BMI) 40.0 and over, adult: Secondary | ICD-10-CM

## 2018-02-13 DIAGNOSIS — M1611 Unilateral primary osteoarthritis, right hip: Secondary | ICD-10-CM

## 2018-02-13 MED ORDER — TRAMADOL HCL 50 MG PO TABS: 50.0000 mg | ORAL_TABLET | Freq: Four times a day (QID) | ORAL | 0 refills | Status: DC | PRN

## 2018-02-13 NOTE — Progress Notes (Signed)
Office Visit Note   Patient: Blake [name]           Date of Birth: 1971-03-01           MRN: [id number] Visit Date: 02/13/2018              Requested by: Clent Demark, PA-C [place], [place] [address] PCP: Clent Demark, PA-C   Assessment & Plan: Visit Diagnoses: No diagnosis found.  Plan:  #1: We will have him see Dr. Ninfa Linden for evaluation for anterior total hip replacement   Follow-Up Instructions: No follow-ups on file.   Orders:  No orders of the defined types were placed in this encounter.  No orders of the defined types were placed in this encounter.     Procedures: No procedures performed   Clinical Data: No additional findings.   Subjective: Chief Complaint  Patient presents with  . Lower Back - Pain  . Back Pain    mri results    HPI  Mr. Fullenwider is seen today for follow-up evaluation of chronic back pain and right hip pain.  He has been noted to have significant OA of his right hip requiring probable total hip replacement.  He also was having back pain and had a MRI performed of which did show some arthritis as well as questionable mass.  Repeat MRI using contrast was ordered and he returns today for review of this information.  He is also very interested in having a total hip replacement to help with his pain in the hip and groin area.   Review of Systems  Constitutional: Positive for fatigue. Negative for fever.  HENT: Negative for ear pain.   Eyes: Negative for pain.  Respiratory: Negative for cough and shortness of breath.   Cardiovascular: Positive for leg swelling.  Gastrointestinal: Negative for constipation and diarrhea.  Genitourinary: Negative for difficulty urinating.  Musculoskeletal: Positive for back pain and neck pain.  Skin: Negative for rash.  Allergic/Immunologic: Negative for food allergies.  Neurological: Positive for weakness and numbness.  Hematological: Does not bruise/bleed easily.    Psychiatric/Behavioral: Positive for sleep disturbance.     Objective: Vital Signs: BP (!) 142/96 (BP Location: Left Arm, Patient Position: Sitting, Cuff Size: Normal)   Pulse 74   Resp 16   Ht 6\' 3"  (1.905 m)   Wt (!) 370 lb (167.8 kg)   BMI 46.25 kg/m   Physical Exam  Constitutional: He is oriented to person, place, and time. He appears well-developed and well-nourished.  HENT:  Mouth/Throat: Oropharynx is clear and moist.  Eyes: Pupils are equal, round, and reactive to light. EOM are normal.  Pulmonary/Chest: Effort normal.  Neurological: He is alert and oriented to person, place, and time.  Skin: Skin is warm and dry.  Psychiatric: He has a normal mood and affect. His behavior is normal.    Ortho Exam  Today he has limited range of motion of the right hip secondary to pain.  He only has a minimal amount of external rotation.  Internal only about 5 degrees before he has pain.  He has negative straight leg raising noted.  I can flex him to about 100 degrees.    He does have some crepitance with range of motion of the right knee. No effusion.  Minimal pain with range of motion at this time.  Specialty Comments:  No specialty comments available.  Imaging: No results found. Mr Lumbar Spine W/o Contrast  Result Date: 01/25/2018  CLINICAL DATA:  Progressive low back pain extending into the right hip and groin with numbness into the right buttocks and left testicle. EXAM: MRI LUMBAR SPINE WITHOUT CONTRAST TECHNIQUE: Multiplanar, multisequence MR imaging of the lumbar spine was performed. No intravenous contrast was administered. COMPARISON:  Spine radiographs 09/28/2017 FINDINGS: Segmentation: 5 non rib-bearing lumbar type vertebral bodies are present. The lowest fully formed vertebral body is L5. Alignment: Slight anterolisthesis is present at L4-5. AP alignment is otherwise anatomic. Leftward curvature is centered at L3-4. Vertebrae: Marrow signal and vertebral body heights are  maintained. Chronic endplate marrow changes are noted inferiorly at T12. There is some scalloping of the posterior vertebral bodies at L2, L3, and L4. Conus medullaris and cauda equina: Conus extends to the L1 level. Indistinct soft tissue is present within the canal at L1. This may represent inflammatory changes of nerve roots at the cauda Randel Pigg a. mass lesion is not excluded. Paraspinal and other soft tissues: Limited imaging of the abdomen is unremarkable. Disc levels: Short pedicles and prominent epidural fat are present throughout the lumbar spine. T12-L1: Central canal and foramina are patent. L1-2: Nerve roots are not well differentiated. Prominent epidural fat is present. There is no significant compressive central or foraminal stenosis. L2-3: A broad-based disc protrusion is noted laterally. Prominent epidural fat is present. Mild facet hypertrophy is evident bilaterally. This leads to mild central and bilateral foraminal narrowing. L3-4: Prominent epidural fat crowds the nerve roots. A broad-based disc protrusion is present. Mild foraminal narrowing is worse on the right. L4-5: There is marked prominence of epidural fat with posterior displacement of compressed nerve roots. Mild foraminal narrowing is worse on the left. Moderate facet hypertrophy is present bilaterally the, worse on the right. L5-S1: Mild facet hypertrophy is present. Central canal and foramina are patent. Prominent epidural fat is noted. IMPRESSION: 1. Ill-defined soft tissue within the canal at the level of the conus medullaris, L1. Recommend MRI of the lumbar spine with contrast for further evaluation to differentiate possible mass lesion from inflamed nerve roots. 2. Prominent epidural fat throughout the lumbar spine with posterior displacement of the thecal sac and nerve roots compatible with epidural lipomatosis. 3. Facet hypertrophy and mild lateral disc protrusions results in mild foraminal narrowing at L2-3, L3-4, and L4-5.  Electronically Signed   By: San Morelle M.D.   On: 01/25/2018 12:53   Mr Lumbar Spine W Contrast  Result Date: 02/04/2018 CLINICAL DATA:  Concern for mass on prior MRI. EXAM: MRI LUMBAR SPINE WITH CONTRAST CONTRAST:  13mL MULTIHANCE GADOBENATE DIMEGLUMINE 529 MG/ML IV SOLN COMPARISON:  Noncontrast MRI 01/25/2018 FINDINGS: No concerning enhancement at L1 or elsewhere to suggest mass lesion. Congenitally narrow lumbar spinal canal with epidural lipomatosis diffusely effacing the thecal sac. Marrow and periarticular enhancement about the right more than left facets at L4-5. A L4-5 annular fissure is also enhancing. IMPRESSION: 1. No abnormal intrathecal enhancement.  Negative for mass. 2. Facet arthritis on the right more than left at L4-5. 3. 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 Electronically Signed   By: Monte Fantasia M.D.   On: 02/04/2018 20:28     PMFS History: Current Outpatient Medications  Medication Sig Dispense Refill  . DULoxetine (CYMBALTA) 60 MG capsule Take 1 capsule (60 mg total) by mouth daily. 30 capsule 11  . gabapentin (NEURONTIN) 600 MG tablet Take 2 tablets (1,200 mg total) by mouth 3 (three) times daily. 180 tablet 3  . naproxen (NAPROSYN) 500 MG  tablet Take 1 tablet (500 mg total) by mouth 2 (two) times daily with a meal. 30 tablet 0  . propranolol ER (INDERAL LA) 60 MG 24 hr capsule Take 1 capsule (60 mg total) by mouth daily. 30 capsule 11  . traMADol (ULTRAM) 50 MG tablet Take 1 tablet (50 mg total) by mouth every 6 (six) hours as needed. 20 tablet 0  . varenicline (CHANTIX STARTING MONTH PAK) 0.5 MG X 11 & 1 MG X 42 tablet Take one 0.5 mg tablet by mouth once daily for 3 days, then increase to one 0.5 mg tablet twice daily for 4 days, then increase to one 1 mg tablet twice daily. 53 tablet 0   No current facility-administered medications for this visit.     Patient Active Problem List   Diagnosis Date  Noted  . Dilated aortic root ([facility])   . Chest pain 07/27/2017  . Palpitations 07/26/2017  . ERECTILE DYSFUNCTION, MILD 11/12/2007  . TOBACCO ABUSE 11/12/2007   Past Medical History:  Diagnosis Date  . Arthritis   . Back pain   . Dilated aortic root ([facility])    109mm ascending aorta by echo 09/2017  . ERECTILE DYSFUNCTION, MILD 11/12/2007   Qualifier: Diagnosis of  By: Carolyne Littles    . TOBACCO ABUSE 11/12/2007   Qualifier: Diagnosis of  By: Carmie End MD, Erin      Family History  Problem Relation Age of Onset  . COPD Mother   . Leukemia Father   . Hypertension Sister   . Diabetes Sister     Past Surgical History:  Procedure Laterality Date  . FACIAL FRACTURE SURGERY     Social History   Occupational History  . Not on file  Tobacco Use  . Smoking status: Current Every Day Smoker    Packs/day: 0.25    Years: 30.00    Pack years: 7.50  . Smokeless tobacco: Never Used  . Tobacco comment: down to 3-4 cigarettes a day   Substance and Sexual Activity  . Alcohol use: Yes    Comment: Drink on weekends  . Drug use: No  . Sexual activity: Not Currently

## 2018-02-28 ENCOUNTER — Telehealth: Payer: Self-pay | Admitting: Cardiology

## 2018-02-28 NOTE — Telephone Encounter (Signed)
° °  [facility] Pre-operative Risk Assessment    Request for surgical clearance:  1. What type of surgery is being performed? Right total hip replacement   2. When is this surgery scheduled? TBD  3. What type of clearance is required (medical clearance vs. Pharmacy clearance to hold med vs. Both)? Medical   4. Are there any medications that need to be held prior to surgery and how long? Please advise any medications that need to be held and how long.   5. Practice name and name of physician performing surgery? [facility], Dr. Joni Fears, Biagio Borg, PA   6. What is your office phone number [phone]    7.   What is your office fax number [phone] or [phone]  8.   Anesthesia type (None, local, MAC, general) ? Not listed    Blake [name] 02/28/2018, 1:52 PM  _________________________________________________________________   (provider comments below)

## 2018-03-05 ENCOUNTER — Other Ambulatory Visit (INDEPENDENT_AMBULATORY_CARE_PROVIDER_SITE_OTHER): Payer: Self-pay | Admitting: Physician Assistant

## 2018-03-05 DIAGNOSIS — M5135 Other intervertebral disc degeneration, thoracolumbar region: Secondary | ICD-10-CM

## 2018-03-05 DIAGNOSIS — M47819 Spondylosis without myelopathy or radiculopathy, site unspecified: Secondary | ICD-10-CM

## 2018-03-05 DIAGNOSIS — M25551 Pain in right hip: Secondary | ICD-10-CM

## 2018-03-05 NOTE — Telephone Encounter (Signed)
FWD to PCP. Julia Alkhatib S Kimetha Trulson, CMA  

## 2018-03-05 NOTE — Telephone Encounter (Signed)
   Primary Cardiologist:Traci Turner, MD  Chart reviewed as part of pre-operative protocol coverage. Because of Blake [name] past medical history and time since last visit, he/she will require a follow-up visit in order to better assess preoperative cardiovascular risk.  Pre-op covering staff: - Please schedule appointment and call patient to inform them. - Please contact requesting surgeon's office via preferred method (i.e, phone, fax) to inform them of need for appointment prior to surgery.  Ledora Bottcher, PA  03/05/2018, 3:43 PM

## 2018-03-06 NOTE — Telephone Encounter (Signed)
Forwarded to requesting party via EPIC fax function 

## 2018-03-07 ENCOUNTER — Telehealth (INDEPENDENT_AMBULATORY_CARE_PROVIDER_SITE_OTHER): Payer: Self-pay | Admitting: Orthopaedic Surgery

## 2018-03-07 NOTE — Telephone Encounter (Signed)
Please advise 

## 2018-03-07 NOTE — Telephone Encounter (Signed)
Patient called to see if Dr. Durward Fortes has received release for surgery paperwork from his cardiologist and PCP.  Patient is requesting a return call.

## 2018-03-07 NOTE — Telephone Encounter (Signed)
Spoke with patient and appt made with Daune Perch, patient voiced understanding.

## 2018-03-07 NOTE — Telephone Encounter (Signed)
thanks

## 2018-03-07 NOTE — Telephone Encounter (Signed)
Patient just called back and stated he now has an appointment with Ezequiel Kayser on Sept 20, 2019 @ 8:30am

## 2018-03-07 NOTE — Telephone Encounter (Signed)
I called patient to let him know that an appointment with his cardiologist will be necessary for  Cardiac clearance. We do have a medical clearance (faxed on 03-01-18) from his PCP Dr. Altamease Oiler, however instructions include getting clearance from Cardiologist.  The following note is in the patient's chart from the Cardiologist, PA on08-26-19:   Chart reviewed as part of pre-operative protocol coverage. Because of Blake [name] past medical history and time since last visit, he will require a follow-up visit in order to better assess preoperative cardiovascular risk.  I have received the same information via fax as of yesterday.  I encouraged the patient to call [facility] and make an appoitment since he has not heard from their office yet, as he is anxious to schedule his surgery.

## 2018-03-16 ENCOUNTER — Telehealth (INDEPENDENT_AMBULATORY_CARE_PROVIDER_SITE_OTHER): Payer: Self-pay | Admitting: Physician Assistant

## 2018-03-16 NOTE — Telephone Encounter (Signed)
FWD to PCP. Tempestt S Roberts, CMA  

## 2018-03-16 NOTE — Telephone Encounter (Signed)
Patient called requesting a letter stating he is unable to work in order to re-apply for food stamps. Patient states that his medicaid has been cancelled and is trying to apply for [place] and [place]. Patient states he tried to get the letter from DR.Whitfield but was told he could not get it since he did not have medicaid anymore.  Please Advice [phone].  Thank [place]

## 2018-03-19 ENCOUNTER — Other Ambulatory Visit (INDEPENDENT_AMBULATORY_CARE_PROVIDER_SITE_OTHER): Payer: Self-pay | Admitting: Physician Assistant

## 2018-03-19 ENCOUNTER — Telehealth (INDEPENDENT_AMBULATORY_CARE_PROVIDER_SITE_OTHER): Payer: Self-pay | Admitting: Physician Assistant

## 2018-03-19 NOTE — Telephone Encounter (Signed)
Patient is aware that letter is at front desk for pick up. Nat Christen, CMA

## 2018-03-19 NOTE — Telephone Encounter (Signed)
Patient called to ask for an update on his letter to re-certify for food stamps. Patient states he needs to turn it in today. Patient is aware that PCP has not responded yet.  Please Follow Up [phone]  Thank you Blake [name]

## 2018-03-19 NOTE — Telephone Encounter (Signed)
I have wrote the letter for Blake [name].

## 2018-03-19 NOTE — Telephone Encounter (Signed)
FWD to PCP. Blake [name], CMA  

## 2018-03-26 ENCOUNTER — Ambulatory Visit: Payer: Medicaid Other

## 2018-03-30 ENCOUNTER — Ambulatory Visit: Payer: Medicaid Other | Admitting: Cardiology

## 2018-03-30 DIAGNOSIS — Z01810 Encounter for preprocedural cardiovascular examination: Secondary | ICD-10-CM | POA: Insufficient documentation

## 2018-03-30 NOTE — Progress Notes (Deleted)
Cardiology Office Note:    Date:  03/30/2018   ID:  Blake [name], DOB Aug 24, 1970, MRN [id number]  PCP:  Clent Demark, PA-C  Cardiologist:  Fransico Him, MD  Referring MD: Clent Demark, PA-C   No chief complaint on file. ***  History of Present Illness:    Blake [name] is a 47 y.o. male with a past medical history significant for tobacco abuse, erectile dysfunction, tremor and dilated aortic root.  He was seen by Dr. Radford Pax 07/27/2017 for complaints of palpitations.  At the time he had reported that his palpitations were considerably improved while on propranolol for tremors.  He underwent an exercise tolerance test which was normal.  An echocardiogram showed normal LV function dilated aortic root.  An event monitor showed only occasional PVCs.    Blake [name] is here today for medical clearance prior to a right total hip replacement.  Past Medical History:  Diagnosis Date  . Arthritis   . Back pain   . Dilated aortic root (HCC)    94mm ascending aorta by echo 09/2017  . ERECTILE DYSFUNCTION, MILD 11/12/2007   Qualifier: Diagnosis of  By: Carolyne Littles    . TOBACCO ABUSE 11/12/2007   Qualifier: Diagnosis of  By: Carmie End MD, Junie Panning      Past Surgical History:  Procedure Laterality Date  . FACIAL FRACTURE SURGERY      Current Medications: No outpatient medications have been marked as taking for the 03/30/18 encounter (Appointment) with Daune Perch, NP.     Allergies:   Patient has no known allergies.   Social History   Socioeconomic History  . Marital status: Single    Spouse name: Not on file  . Number of children: Not on file  . Years of education: Not on file  . Highest education level: Not on file  Occupational History  . Not on file  Social Needs  . Financial resource strain: Not on file  . Food insecurity:    Worry: Not on file    Inability: Not on file  . Transportation needs:    Medical: Not on file    Non-medical: Not on file  Tobacco Use  .  Smoking status: Current Every Day Smoker    Packs/day: 0.25    Years: 30.00    Pack years: 7.50  . Smokeless tobacco: Never Used  . Tobacco comment: down to 3-4 cigarettes a day   Substance and Sexual Activity  . Alcohol use: Yes    Comment: Drink on weekends  . Drug use: No  . Sexual activity: Not Currently  Lifestyle  . Physical activity:    Days per week: Not on file    Minutes per session: Not on file  . Stress: Not on file  Relationships  . Social connections:    Talks on phone: Not on file    Gets together: Not on file    Attends religious service: Not on file    Active member of club or organization: Not on file    Attends meetings of clubs or organizations: Not on file    Relationship status: Not on file  Other Topics Concern  . Not on file  Social History Narrative  . Not on file     Family History: The patient's ***family history includes COPD in his mother; Diabetes in his sister; Hypertension in his sister; Leukemia in his father. ROS:   Please see the history of present illness.    *** All other  systems reviewed and are negative.  EKGs/Labs/Other Studies Reviewed:    The following studies were reviewed today:  Echocardiogram 09/28/2017 Study Conclusions - Left ventricle: The cavity size was normal. There was mild focal   basal hypertrophy of the septum. Systolic function was normal.   The estimated ejection fraction was in the range of 55% to 60%.   Wall motion was normal; there were no regional wall motion   abnormalities. Doppler parameters are consistent with abnormal   left ventricular relaxation (grade 1 diastolic dysfunction). - Aorta: Aortic root dimension: 39 mm (ED). - Ascending aorta: The ascending aorta was mildly dilated. - Left atrium: The atrium was mildly dilated. - Right ventricle: The cavity size was mildly dilated. Wall   thickness was normal.  Cardiac event monitor 09/14/2017  Normal sinus rhythma with heart rate ranging from  80-110bpm.  Occasional PVC  Exercise tolerance test 08/10/2017  Blood pressure demonstrated a normal response to exercise.  No T wave inversion was noted during stress.  There was no ST segment deviation noted during stress.  Arrhythmias during stress: rare PVCs  The patient experienced no angina during the stress test  Overall, the patient's exercise capacity was mildly impaired  Duke Treadmill Score: low risk Negative stress test without evidence of ischemia at given workload. Rare PVC's noted with exercise.     EKG:  EKG is *** ordered today.  The ekg ordered today demonstrates ***  Recent Labs: 06/22/2017: ALT 21; BUN 13; Creatinine, Ser 0.90; Hemoglobin 15.4; Platelets 271; Potassium 4.7; Sodium 141; TSH 1.640   Recent Lipid Panel    Component Value Date/Time   CHOL 172 12/11/2007 2056   TRIG 112 12/11/2007 2056   HDL 56 12/11/2007 2056   CHOLHDL 3.1 Ratio 12/11/2007 2056   VLDL 22 12/11/2007 2056   LDLCALC 94 12/11/2007 2056    Physical Exam:    VS:  There were no vitals taken for this visit.    Wt Readings from Last 3 Encounters:  02/13/18 (!) 370 lb (167.8 kg)  02/05/18 (!) 371 lb (168.3 kg)  01/29/18 (!) 360 lb (163.3 kg)     Physical Exam***   ASSESSMENT:    1. Palpitations   2. TOBACCO ABUSE   3. Dilated aortic root ([place])   4. Preop cardiovascular exam    PLAN:    In order of problems listed above:  Palpitations: On propranolol for tremors Event monitor in 09/27/2017 showed only occasional PVCs, benign.  Aortic root dilatation: 39 mm by echo in 09/2017.  Plan for repeat echo in 3//2020  Tobacco abuse   Preoperative clearance for right total hip replacement: The patient had a normal stress test and echocardiogram earlier this year.  He is at low risk for the procedure and can proceed without further cardiac testing   Medication Adjustments/Labs and Tests Ordered: Current medicines are reviewed at length with the patient today.   Concerns regarding medicines are outlined above. Labs and tests ordered and medication changes are outlined in the patient instructions below:  There are no Patient Instructions on file for this visit.   Signed, Daune Perch, NP  03/30/2018 4:36 AM    [place]

## 2018-04-02 ENCOUNTER — Encounter (INDEPENDENT_AMBULATORY_CARE_PROVIDER_SITE_OTHER): Payer: Self-pay | Admitting: Physician Assistant

## 2018-04-02 ENCOUNTER — Ambulatory Visit (INDEPENDENT_AMBULATORY_CARE_PROVIDER_SITE_OTHER): Payer: Self-pay | Admitting: Physician Assistant

## 2018-04-02 ENCOUNTER — Other Ambulatory Visit: Payer: Self-pay

## 2018-04-02 VITALS — BP 123/90 | HR 82 | Temp 98.0°F | Ht 75.0 in | Wt 360.6 lb

## 2018-04-02 DIAGNOSIS — M25551 Pain in right hip: Secondary | ICD-10-CM

## 2018-04-02 DIAGNOSIS — M5442 Lumbago with sciatica, left side: Secondary | ICD-10-CM

## 2018-04-02 DIAGNOSIS — Z720 Tobacco use: Secondary | ICD-10-CM

## 2018-04-02 DIAGNOSIS — M5441 Lumbago with sciatica, right side: Secondary | ICD-10-CM

## 2018-04-02 DIAGNOSIS — Z760 Encounter for issue of repeat prescription: Secondary | ICD-10-CM

## 2018-04-02 DIAGNOSIS — G8929 Other chronic pain: Secondary | ICD-10-CM

## 2018-04-02 DIAGNOSIS — E882 Lipomatosis, not elsewhere classified: Secondary | ICD-10-CM | POA: Insufficient documentation

## 2018-04-02 DIAGNOSIS — D1779 Benign lipomatous neoplasm of other sites: Secondary | ICD-10-CM | POA: Insufficient documentation

## 2018-04-02 MED ORDER — VARENICLINE TARTRATE 0.5 MG X 11 & 1 MG X 42 PO MISC: ORAL | 0 refills | Status: DC

## 2018-04-02 MED ORDER — ACETAMINOPHEN-CODEINE #3 300-30 MG PO TABS: 1.0000 | ORAL_TABLET | Freq: Four times a day (QID) | ORAL | 0 refills | Status: DC | PRN

## 2018-04-02 MED ORDER — DULOXETINE HCL 60 MG PO CPEP: 60.0000 mg | ORAL_CAPSULE | Freq: Every day | ORAL | 11 refills | Status: DC

## 2018-04-02 MED ORDER — PROPRANOLOL HCL ER 60 MG PO CP24
60.0000 mg | ORAL_CAPSULE | Freq: Every day | ORAL | 11 refills | Status: DC
Start: 2018-04-02 — End: 2018-09-28

## 2018-04-02 MED ORDER — NAPROXEN 500 MG PO TABS: ORAL_TABLET | ORAL | 1 refills | Status: DC

## 2018-04-02 MED ORDER — GABAPENTIN 600 MG PO TABS: 1200.0000 mg | ORAL_TABLET | Freq: Three times a day (TID) | ORAL | 3 refills | Status: DC

## 2018-04-02 MED ORDER — VARENICLINE TARTRATE 1 MG PO TABS: 1.0000 mg | ORAL_TABLET | Freq: Two times a day (BID) | ORAL | 2 refills | Status: DC

## 2018-04-02 MED FILL — !CHANTIX CONT MONTH BOX: 1 | 28 days supply | Qty: 56 | Fill #0

## 2018-04-02 MED FILL — GABAPENTIN 600 MG TABLET: 600 | 30 days supply | Qty: 180 | Fill #0

## 2018-04-02 MED FILL — NAPROXEN 500 MG TABLET: 500 | 15 days supply | Qty: 30 | Fill #0

## 2018-04-02 MED FILL — DULoxetine HCL 60 MG CPEP: 60 | 30 days supply | Qty: 30 | Fill #0

## 2018-04-02 MED FILL — PROPRANOLOL HCL ER 60 MG CP: 60 | 30 days supply | Qty: 30 | Fill #0

## 2018-04-02 NOTE — Progress Notes (Signed)
Subjective:  Patient ID: Blake [name], male    DOB: 07-Sep-1970  Age: 47 y.o. MRN: [id number]  CC: f/u back pain and hip pain  HPI DAQUANN MERRIOTT a 47 y.o.malewith a medical history of hives, LBP w/sciatica, DDD, epidural lipomatosis, OA right hip, tremors, cervical strain, and tobacco abuse presents to f/u on back pain and hip pain.  Orthopedic specialist feels he is a candidate for an epidural steroid injection to the back. Pt also being considered for a total hip replacement. 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 Pt continues with severe pain which is affecting his ADLs to a great degree. He is currently not working due to pain. Would like to proceed with surgery and has been referred to Dr. Ninfa Linden for evaluation of anterior total hip replacement. Unfortunately, patient has recently lost his insurance coverage and will need a referral elsewhere. He has begun his CAFA application and is awaiting response from the IRS so that he may complete his CAFA application. Will need a referral resent to orthopedics, cardiology, pain clinic, sleep study, and neurosurgeon once CAFA is approved.    Pt was prescribed Cymbalta six weeks ago. Says Cymbalta was only minimally helpful for back and hip pain. Could not start Chantix because of lack of insurance coverage and cost was prohibitive. States he spoke to neurosurgy and they were debating as to how to approach removal of the epidural lipomatosis. Unfortunately, pt is unable to proceed with neurosurgery treatment due to loss of insurance. Endorse numbness and weakness of lower extremities and chronic lower back pain. Does not endorse paralysis, urinary incontinence, fecal incontinence, or any other symptoms/complaints.    Outpatient Medications Prior to Visit   Medication Sig Dispense Refill  . DULoxetine (CYMBALTA) 60 MG capsule Take 1 capsule (60 mg total) by mouth daily. 30 capsule 11  . gabapentin (NEURONTIN) 600 MG tablet Take 2 tablets (1,200 mg total) by mouth 3 (three) times daily. 180 tablet 3  . propranolol ER (INDERAL LA) 60 MG 24 hr capsule Take 1 capsule (60 mg total) by mouth daily. 30 capsule 11  . traMADol (ULTRAM) 50 MG tablet Take 1 tablet (50 mg total) by mouth every 6 (six) hours as needed. 20 tablet 0  . naproxen (NAPROSYN) 500 MG tablet TAKE 1 TABLET BY MOUTH TWICE A DAY WITH A MEAL (Patient not taking: Reported on 04/02/2018) 30 tablet 0  . varenicline (CHANTIX STARTING MONTH PAK) 0.5 MG X 11 & 1 MG X 42 tablet Take one 0.5 mg tablet by mouth once daily for 3 days, then increase to one 0.5 mg tablet twice daily for 4 days, then increase to one 1 mg tablet twice daily. (Patient not taking: Reported on 04/02/2018) 53 tablet 0   No facility-administered medications prior to visit.      ROS Review of Systems  Constitutional: Negative for chills, fever and malaise/fatigue.  Eyes: Negative for blurred vision.  Respiratory: Negative for shortness of breath.   Cardiovascular: Negative for chest pain and palpitations.  Gastrointestinal: Negative for abdominal pain and nausea.  Genitourinary: Negative for dysuria and hematuria.  Musculoskeletal: Positive for back pain and joint pain. Negative for myalgias.  Skin: Negative for rash.  Neurological: Negative for tingling and headaches.       Numbness and weakness of the RLE  Psychiatric/Behavioral: Negative for depression.  The patient is not nervous/anxious.     Objective:  BP 123/90 (BP Location: Left Arm, Patient Position: Sitting, Cuff Size: Large)   Pulse 82   Temp 98 F (36.7 C) (Oral)   Ht 6\' 3"  (1.905 m)   Wt (!) 360 lb 9.6 oz (163.6 kg)   SpO2 93%   BMI 45.07 kg/m   BP/Weight 04/02/2018 02/13/2018 1/76/1607  Systolic BP 371 062 694  Diastolic BP 90 96 90  Wt. (Lbs)  360.6 370 371  BMI 45.07 46.25 47.63      Physical Exam  Constitutional: He is oriented to person, place, and time.  Well developed, obese, NAD, polite  HENT:  Head: Normocephalic and atraumatic.  Eyes: No scleral icterus.  Neck: Normal range of motion. Neck supple. No thyromegaly present.  Cardiovascular: Normal rate, regular rhythm and normal heart sounds.  Pulmonary/Chest: Effort normal and breath sounds normal.  Musculoskeletal: He exhibits no edema.  Moderately severe limited aROM of the right hip 2/2 pain.  Neurological: He is alert and oriented to person, place, and time.  Head bobbing. Mildly antalgic gait favoring right side.   Skin: Skin is warm and dry. No rash noted. No erythema. No pallor.  Psychiatric: He has a normal mood and affect. His behavior is normal. Thought content normal.  Vitals reviewed.    Assessment & Plan:    1. Chronic bilateral low back pain with bilateral sciatica - Refill gabapentin (NEURONTIN) 600 MG tablet; Take 2 tablets (1,200 mg total) by mouth 3 (three) times daily.  Dispense: 180 tablet; Refill: 3 - Refill DULoxetine (CYMBALTA) 60 MG capsule; Take 1 capsule (60 mg total) by mouth daily.  Dispense: 30 capsule; Refill: 11 - Begin acetaminophen-codeine (TYLENOL #3) 300-30 MG tablet; Take 1 tablet by mouth every 6 (six) hours as needed for moderate pain.  Dispense: 120 tablet; Refill: 0  2. Epidural lipomatosis - Begin acetaminophen-codeine (TYLENOL #3) 300-30 MG tablet; Take 1 tablet by mouth every 6 (six) hours as needed for moderate pain.  Dispense: 120 tablet; Refill: 0  3. Right hip pain - Refill naproxen (NAPROSYN) 500 MG tablet; TAKE 1 TABLET BY MOUTH TWICE A DAY WITH A MEAL  Dispense: 30 tablet; Refill: 1 - Begin acetaminophen-codeine (TYLENOL #3) 300-30 MG tablet; Take 1 tablet by mouth every 6 (six) hours as needed for moderate pain.  Dispense: 120 tablet; Refill: 0  4. Medication refill - Refill propranolol ER (INDERAL LA) 60 MG  24 hr capsule; Take 1 capsule (60 mg total) by mouth daily.  Dispense: 30 capsule; Refill: 11  5. Tobacco abuse - Begin after starting month pack, varenicline (CHANTIX CONTINUING MONTH PAK) 1 MG tablet; Take 1 tablet (1 mg total) by mouth 2 (two) times daily.  Dispense: 30 tablet; Refill: 2 - Begin varenicline (CHANTIX STARTING MONTH PAK) 0.5 MG X 11 & 1 MG X 42 tablet; Take one 0.5 mg tablet by mouth once daily for 3 days, then increase to one 0.5 mg tablet twice daily for 4 days, then increase to one 1 mg tablet twice daily.  Dispense: 53 tablet; Refill: 0   Meds ordered this encounter  Medications  . gabapentin (NEURONTIN) 600 MG tablet    Sig: Take 2 tablets (1,200 mg total) by mouth 3 (three) times daily.    Dispense:  180 tablet    Refill:  3    Order Specific Question:   Supervising Provider    Answer:   Charlott Rakes [[id number]]  . DULoxetine (CYMBALTA) 60  MG capsule    Sig: Take 1 capsule (60 mg total) by mouth daily.    Dispense:  30 capsule    Refill:  11    Order Specific Question:   Supervising Provider    Answer:   Charlott Rakes [[id number]]  . naproxen (NAPROSYN) 500 MG tablet    Sig: TAKE 1 TABLET BY MOUTH TWICE A DAY WITH A MEAL    Dispense:  30 tablet    Refill:  1    Order Specific Question:   Supervising Provider    Answer:   Charlott Rakes [[id number]]  . propranolol ER (INDERAL LA) 60 MG 24 hr capsule    Sig: Take 1 capsule (60 mg total) by mouth daily.    Dispense:  30 capsule    Refill:  11    Order Specific Question:   Supervising Provider    Answer:   Charlott Rakes [[id number]]  . acetaminophen-codeine (TYLENOL #3) 300-30 MG tablet    Sig: Take 1 tablet by mouth every 6 (six) hours as needed for moderate pain.    Dispense:  120 tablet    Refill:  0    For chronic pain.    Order Specific Question:   Supervising Provider    Answer:   Charlott Rakes [[id number]]  . varenicline (CHANTIX CONTINUING MONTH PAK) 1 MG tablet    Sig: Take 1 tablet (1 mg total) by mouth 2 (two)  times daily.    Dispense:  30 tablet    Refill:  2    Order Specific Question:   Supervising Provider    Answer:   Charlott Rakes [[id number]]  . varenicline (CHANTIX STARTING MONTH PAK) 0.5 MG X 11 & 1 MG X 42 tablet    Sig: Take one 0.5 mg tablet by mouth once daily for 3 days, then increase to one 0.5 mg tablet twice daily for 4 days, then increase to one 1 mg tablet twice daily.    Dispense:  53 tablet    Refill:  0    Order Specific Question:   Supervising Provider    Answer:   Charlott Rakes [[id number]]    Follow-up: Return in about 4 weeks (around 04/30/2018) for right hip pain.   Clent Demark PA

## 2018-04-02 NOTE — Patient Instructions (Signed)
Community Resources  Advocacy/Legal Legal Aid [place]:  1-866-219-5262  /  336-272-0148  Family Justice Center:  336-641-7233  [facility] [place] 24-hr Crisis line:  336-273-7273  Women's Resource Center, [place]:  336-275-6090  Court Watch (custody):  336-275-2346  [place] [facility]:   336-279-9299    Baby & Breastfeeding Car Seat Inspection @ Various [place] Fire Depts.- call 336-373-2177  [place] [place] [facility]  336-832-6860  [facility] 336-878-6712  WIC: 336-641-3663 ([place]);  336-641-7571 ([place])  La Leche League:  1-877-452-5321   Childcare [place] [facility]: 336-369-5097 ([place]) / 336-887-8224 ([place])  - Child Care Resources/ Referrals/ Scholarships  - Head Start/ Early Head Start (call or apply online)  [place] DHHS: [place] Pre-K :  1-800-859-0829 / 336-274-5437   Employment / Job Search [facility] [place]: 336-275-6090 / [address]  [employer] (JobLink): 336-373-5922 ([place]) / 336-882-4141 ([place])  [facility]/ Career Center: 336-275-9801 / 336-282-7307  [facility] Job & Career Center: 336-373-3764  DHHS Work First: 336-641-3447 ([place]) / 336-641-3447 ([place])  [facility] [place]:  336-676-5871   Financial Assistance [place] [facility]:  336-553-2657  [employer]: 336-235-0368  [employer] (furniture):  336-370-4002  [place] [facility]: 336-373-4264  Low Income Energy Assistance  336-641-3000   Food Assistance DHHS- SNAP/ Food Stamps: 336-641-4588  WIC: [place]- 336-641-3663 ;  [place] 336-641-7571  Little Green Book- Free Meals  Little Blue Book- Free Food Pantries  During the summer, text "FOOD" to 877877   General Health / Clinics (Adults) Orange Card (for Adults) through [facility]: (336) 895-4900  [facility]:   336-832-8035  [facility]:   336-832-4444  Health Department:  336-641-3245  [facility]:  336-415-3877 / 336-641-2100  [facility] [place]:   336-373-0678  [facility]:   336-334-4822 x 50251   Housing [place] [facility]:   336-691-9521  [place] Housing Authority:  336-275-8501  Affordable Housing Managemnt:  336-273-0568   Immigrant/ Refugee Center for [place] ([employer]CG):  336-256-1065  [facility]:  336-379-0037  [employer]:  336-937-4701  [employer] Services:  336-617-0381  African Services Coalition:  336-574-2677   LGBTQ YouthSAFE  www.youthsafe[place].org  PFLAG  336-541-6754 / info@[place].org  The Trevor Project:  1-866-488-7386   Mental Health/ Substance Use [facility]  336-387-6161  [facility]:  336-832-9700 or 1-800-711-2635  [facility] of Care:  336-271-5888  [facility]:  336-294-1349  [facility]:  336-542-2884  [facility] (walk-ins)  336-676-6840 / [address]  [place]:  800-449-1287  Alcoholics Anonymous:  336-854-4278  Narcotics Anonymous:  800-365-1036  Quit Smoking Hotline:  800-QUIT-NOW (800-784-8669)   Parenting Children's Home Society:  800-632-1400  : Education Center & Support Groups:  336-832-6682  YWCA: 336-273-3461  [employer]: Bringing Out the Best:  336-334-3120               Thriving at Three (Hispanic families): 336-256-1066  Healthy Start ([facility]):  336-387-6161 x2288  Parents as Teachers:  336-691-0024  [place] [facility]- Learning Together (Immigrants): 336-369-5001   Poison Control 800-222-1222  Sports & Recreation [employer] Open Doors Application: ymcanwnc.org/join/open-doors-financial-assistance/  City of [place] Recreation Centers: http://www.[place].gov/index.aspx?page=3615   Special Needs Family Support Network:  336-832-6507  Autism Society of [place]:   336-333-0197 x1402 or x1412 /  800-785-1035  [employer] [place]:  336-334-5773     ARC of [place]:  336-373-1076  Children's Developmental Service Agency (CDSA):  336-334-5601  CC4C (Care Coordination for Children):  336-641-7641   Transportation Medicaid Transportation: 336-641-4848 to apply  [place] Transit Authority: 336-335-6499 (reduced-fare bus ID to Medicaid/ Medicare/ Orange Card)  SCAT Paratransit services: Eligible riders only, call 336-333-6589 for application   Tutoring/Mentoring [facility] Child Development Institute: 336-230-2138  Big Brothers/ Big Sisters: 336-378-9100 ([place])  336-882-4167 ([place])  ACES through child's school: 336-370-2321  [employer] Achievers: contact your local Y  SHIELD Mentor Program: 336-337-2771   

## 2018-04-03 ENCOUNTER — Encounter: Payer: Self-pay | Admitting: Cardiology

## 2018-04-23 ENCOUNTER — Ambulatory Visit: Payer: Medicaid Other

## 2018-04-26 ENCOUNTER — Ambulatory Visit: Payer: Self-pay | Attending: Family Medicine

## 2018-04-26 MED FILL — NAPROXEN 500 MG TABLET: 500 | 15 days supply | Qty: 30 | Fill #0

## 2018-04-26 MED FILL — PROPRANOLOL HCL ER 60 MG CP: 60 | 30 days supply | Qty: 30 | Fill #0

## 2018-04-26 MED FILL — !CHANTIX 1 MG TABLET: 1 | 21 days supply | Qty: 42 | Fill #0

## 2018-04-26 MED FILL — GABAPENTIN 600 MG TABLET: 600 | 30 days supply | Qty: 180 | Fill #0

## 2018-04-26 MED FILL — ACETAMINOPHEN/COD #3 TABLET: 300-30 | 30 days supply | Qty: 120 | Fill #0

## 2018-04-26 MED FILL — $CHANTIX 0.5 MG TABLET: 0.5 | 7 days supply | Qty: 11 | Fill #0

## 2018-04-26 MED FILL — DULoxetine HCL 60 MG CPEP: 60 | 30 days supply | Qty: 30 | Fill #0

## 2018-05-02 ENCOUNTER — Ambulatory Visit (INDEPENDENT_AMBULATORY_CARE_PROVIDER_SITE_OTHER): Payer: Medicaid Other | Admitting: Physician Assistant

## 2018-05-08 ENCOUNTER — Encounter (INDEPENDENT_AMBULATORY_CARE_PROVIDER_SITE_OTHER): Payer: Self-pay | Admitting: Physician Assistant

## 2018-05-08 ENCOUNTER — Ambulatory Visit (INDEPENDENT_AMBULATORY_CARE_PROVIDER_SITE_OTHER): Payer: Self-pay | Admitting: Physician Assistant

## 2018-05-08 ENCOUNTER — Other Ambulatory Visit: Payer: Self-pay

## 2018-05-08 VITALS — BP 126/89 | HR 68 | Temp 97.8°F | Ht 75.0 in | Wt 353.8 lb

## 2018-05-08 DIAGNOSIS — R519 Headache, unspecified: Secondary | ICD-10-CM

## 2018-05-08 DIAGNOSIS — M5442 Lumbago with sciatica, left side: Secondary | ICD-10-CM

## 2018-05-08 DIAGNOSIS — G8929 Other chronic pain: Secondary | ICD-10-CM

## 2018-05-08 DIAGNOSIS — R51 Headache: Secondary | ICD-10-CM

## 2018-05-08 DIAGNOSIS — M25551 Pain in right hip: Secondary | ICD-10-CM

## 2018-05-08 DIAGNOSIS — M5441 Lumbago with sciatica, right side: Secondary | ICD-10-CM

## 2018-05-08 DIAGNOSIS — D1779 Benign lipomatous neoplasm of other sites: Secondary | ICD-10-CM

## 2018-05-08 MED ORDER — CYCLOBENZAPRINE HCL 10 MG PO TABS: 10.0000 mg | ORAL_TABLET | Freq: Every day | ORAL | 1 refills | Status: DC

## 2018-05-08 MED ORDER — MELOXICAM 15 MG PO TABS: 15.0000 mg | ORAL_TABLET | Freq: Every day | ORAL | 1 refills | Status: DC

## 2018-05-08 MED ORDER — ACETAMINOPHEN-CODEINE #3 300-30 MG PO TABS: 1.0000 | ORAL_TABLET | Freq: Three times a day (TID) | ORAL | 0 refills | Status: DC | PRN

## 2018-05-08 NOTE — Patient Instructions (Signed)
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
## 2018-06-04 ENCOUNTER — Ambulatory Visit (INDEPENDENT_AMBULATORY_CARE_PROVIDER_SITE_OTHER): Payer: Medicaid Other | Admitting: Physician Assistant

## 2018-06-05 MED FILL — CYCLOBENZAPRINE 10 MG TAB: 10 | 30 days supply | Qty: 30 | Fill #0

## 2018-06-05 MED FILL — GABAPENTIN 600 MG TABLET: 600 | 30 days supply | Qty: 180 | Fill #1

## 2018-06-05 MED FILL — ACETAMINOPHEN/COD #3 TABLET: 300-30 | 30 days supply | Qty: 90 | Fill #0

## 2018-06-05 MED FILL — MELOXICAM 15 MG TABLET: 15 | 30 days supply | Qty: 30 | Fill #0

## 2018-06-05 MED FILL — !CHANTIX CONT MONTH BOX: 1 | 28 days supply | Qty: 56 | Fill #0

## 2018-06-05 MED FILL — PROPRANOLOL HCL ER 60 MG CP: 60 | 30 days supply | Qty: 30 | Fill #1

## 2018-06-05 MED FILL — ?DULoxetine HCL 6OMG CP: 60 | 30 days supply | Qty: 30 | Fill #1

## 2018-06-21 ENCOUNTER — Encounter (INDEPENDENT_AMBULATORY_CARE_PROVIDER_SITE_OTHER): Payer: Self-pay | Admitting: Physician Assistant

## 2018-06-21 ENCOUNTER — Ambulatory Visit (INDEPENDENT_AMBULATORY_CARE_PROVIDER_SITE_OTHER): Payer: Self-pay | Admitting: Physician Assistant

## 2018-06-21 ENCOUNTER — Other Ambulatory Visit: Payer: Self-pay

## 2018-06-21 VITALS — BP 122/82 | HR 91 | Temp 98.0°F | Ht 75.0 in | Wt 353.0 lb

## 2018-06-21 DIAGNOSIS — G8929 Other chronic pain: Secondary | ICD-10-CM

## 2018-06-21 DIAGNOSIS — M25551 Pain in right hip: Secondary | ICD-10-CM

## 2018-06-21 DIAGNOSIS — D1779 Benign lipomatous neoplasm of other sites: Secondary | ICD-10-CM

## 2018-06-21 DIAGNOSIS — H538 Other visual disturbances: Secondary | ICD-10-CM

## 2018-06-21 DIAGNOSIS — M5442 Lumbago with sciatica, left side: Secondary | ICD-10-CM

## 2018-06-21 DIAGNOSIS — M5441 Lumbago with sciatica, right side: Secondary | ICD-10-CM

## 2018-06-21 MED ORDER — ACETAMINOPHEN-CODEINE #3 300-30 MG PO TABS: 1.0000 | ORAL_TABLET | Freq: Three times a day (TID) | ORAL | 0 refills | Status: DC | PRN

## 2018-06-21 MED ORDER — MELOXICAM 15 MG PO TABS: 15.0000 mg | ORAL_TABLET | Freq: Every day | ORAL | 0 refills | Status: DC

## 2018-06-21 MED FILL — MELOXICAM 15 MG TABLET: 15 | 30 days supply | Qty: 30 | Fill #0

## 2018-06-21 NOTE — Patient Instructions (Signed)
Paresthesia Paresthesia is a burning or prickling feeling. This feeling can happen in any part of the body. It often happens in the hands, arms, legs, or feet. Usually, it is not painful. In most cases, the feeling goes away in a short time and is not a sign of a serious problem. Follow these instructions at home:  Avoid drinking alcohol.  Try massage or needle therapy (acupuncture) to help with your problems.  Keep all follow-up visits as told by your doctor. This is important. Contact a doctor if:  You keep on having episodes of paresthesia.  Your burning or prickling feeling gets worse when you walk.  You have pain or cramps.  You feel dizzy.  You have a rash. Get help right away if:  You feel weak.  You have trouble walking or moving.  You have problems speaking, understanding, or seeing.  You feel confused.  You cannot control when you pee (urinate) or poop (bowel movement).  You lose feeling (numbness) after an injury.  You pass out (faint). This information is not intended to replace advice given to you by your health care provider. Make sure you discuss any questions you have with your health care provider. Document Released: 06/09/2008 Document Revised: 12/03/2015 Document Reviewed: 06/23/2014 Elsevier Interactive Patient Education  2018 [employer]  

## 2018-06-21 NOTE — Progress Notes (Signed)
Subjective:  Patient ID: Blake [name], male    DOB: 1971-04-19  Age: 47 y.o. MRN: [id number]  CC: f/u back pain and hip pain  HPI Blake [name] a 47 y.o.malewith a medical history of hives, LBPw/sciatica, DDD, epidural lipomatosis, OA right hip,tremors,cervical strain,and tobacco abusepresentsto f/u on back pain and hip pain. He has obtained CFA and will be seeing neurosurgeon next week. Currently endorses moderately severe lower back pain with paresthesia of the RLE. Cymbalta, Gabapentin, NSAIDs, and Tylenol #3 having only been moderately helpful. ADLs have been hindered 2/2 pain. Ability to work has not been possible 2/2 pain and risk of injury. Looking forward to possible surgery to be able to work and ambulate without pain.     Pt requests a referral to ophthalmologist to evaluate visual blurring when reading fine print. Feels his right eye is worse than his left eye. Attributed to aging. Does not endorse any other visual deficits.      Outpatient Medications Prior to Visit  Medication Sig Dispense Refill  . DULoxetine (CYMBALTA) 60 MG capsule Take 1 capsule (60 mg total) by mouth daily. 30 capsule 11  . gabapentin (NEURONTIN) 600 MG tablet Take 2 tablets (1,200 mg total) by mouth 3 (three) times daily. 180 tablet 3  . propranolol ER (INDERAL LA) 60 MG 24 hr capsule Take 1 capsule (60 mg total) by mouth daily. 30 capsule 11  . varenicline (CHANTIX CONTINUING MONTH PAK) 1 MG tablet Take 1 tablet (1 mg total) by mouth 2 (two) times daily. 30 tablet 2  . acetaminophen-codeine (TYLENOL #3) 300-30 MG tablet Take 1 tablet by mouth every 8 (eight) hours as needed for moderate pain. (Patient not taking: Reported on 06/21/2018) 90 tablet 0  . cyclobenzaprine (FLEXERIL) 10 MG tablet Take 1 tablet (10 mg total) by mouth at bedtime. (Patient not taking: Reported on 06/21/2018) 30 tablet 1  . meloxicam (MOBIC) 15 MG tablet Take 1 tablet (15 mg total) by mouth daily. (Patient not taking:  Reported on 06/21/2018) 30 tablet 1   No facility-administered medications prior to visit.      ROS Review of Systems  Constitutional: Negative for chills, fever and malaise/fatigue.  Eyes: Positive for blurred vision.  Respiratory: Negative for shortness of breath.   Cardiovascular: Negative for chest pain and palpitations.  Gastrointestinal: Negative for abdominal pain and nausea.  Genitourinary: Negative for dysuria and hematuria.  Musculoskeletal: Positive for back pain. Negative for joint pain and myalgias.  Skin: Negative for rash.  Neurological: Positive for tingling and weakness. Negative for headaches.  Psychiatric/Behavioral: Negative for depression. The patient is not nervous/anxious.     Objective:  BP 122/82 (BP Location: Left Arm, Patient Position: Sitting, Cuff Size: Large)   Pulse 91   Temp 98 F (36.7 C) (Oral)   Ht 6\' 3"  (1.905 m)   Wt (!) 353 lb (160.1 kg)   SpO2 95%   BMI 44.12 kg/m   BP/Weight 06/21/2018 05/08/2018 07/11/7508  Systolic BP 258 527 782  Diastolic BP 82 89 90  Wt. (Lbs) 353 353.8 360.6  BMI 44.12 44.22 45.07      Physical Exam Vitals signs reviewed.  Constitutional:      Comments: Well developed, well nourished, NAD, polite  HENT:     Head: Normocephalic and atraumatic.  Eyes:     General: No scleral icterus. Neck:     Musculoskeletal: Normal range of motion and neck supple.     Thyroid: No thyromegaly.  Cardiovascular:  Rate and Rhythm: Normal rate and regular rhythm.     Heart sounds: Normal heart sounds.     Comments: No LE edema bilaterally Pulmonary:     Effort: Pulmonary effort is normal.     Breath sounds: Normal breath sounds.  Musculoskeletal:     Comments: Lumbar spine with severely limited aROM 2/2 pain.  Skin:    General: Skin is warm and dry.     Coloration: Skin is not pale.     Findings: No erythema or rash.  Neurological:     Mental Status: He is alert and oriented to person, place, and time.      Gait: Gait abnormal.  Psychiatric:        Behavior: Behavior normal.        Thought Content: Thought content normal.      Assessment & Plan:     1. Chronic bilateral low back pain with bilateral sciatica - acetaminophen-codeine (TYLENOL #3) 300-30 MG tablet; Take 1 tablet by mouth every 8 (eight) hours as needed for moderate pain.  Dispense: 42 tablet; Refill: 0 - meloxicam (MOBIC) 15 MG tablet; Take 1 tablet (15 mg total) by mouth daily.  Dispense: 30 tablet; Refill: 0  2. Epidural lipomatosis - acetaminophen-codeine (TYLENOL #3) 300-30 MG tablet; Take 1 tablet by mouth every 8 (eight) hours as needed for moderate pain.  Dispense: 42 tablet; Refill: 0 - meloxicam (MOBIC) 15 MG tablet; Take 1 tablet (15 mg total) by mouth daily.  Dispense: 30 tablet; Refill: 0  3. Right hip pain - acetaminophen-codeine (TYLENOL #3) 300-30 MG tablet; Take 1 tablet by mouth every 8 (eight) hours as needed for moderate pain.  Dispense: 42 tablet; Refill: 0 - meloxicam (MOBIC) 15 MG tablet; Take 1 tablet (15 mg total) by mouth daily.  Dispense: 30 tablet; Refill: 0  4. Blurring of visual image of both eyes - Ambulatory referral to Ophthalmology   Meds ordered this encounter  Medications  . acetaminophen-codeine (TYLENOL #3) 300-30 MG tablet    Sig: Take 1 tablet by mouth every 8 (eight) hours as needed for moderate pain.    Dispense:  42 tablet    Refill:  0    For chronic pain.    Order Specific Question:   Supervising Provider    Answer:   Charlott Rakes [[id number]]  . meloxicam (MOBIC) 15 MG tablet    Sig: Take 1 tablet (15 mg total) by mouth daily.    Dispense:  30 tablet    Refill:  0    Order Specific Question:   Supervising Provider    Answer:   Charlott Rakes [[id number]]    Follow-up: Return if symptoms worsen or fail to improve, for after surgery.   Clent Demark PA

## 2018-06-27 ENCOUNTER — Telehealth: Payer: Self-pay | Admitting: Cardiology

## 2018-06-27 NOTE — Telephone Encounter (Signed)
New message   There is a clearance that was sent in August for hip replacement surgery. The patient states that he still wants to have this surgery done in mid January. Please advise if the patient just needs to setup an appt.

## 2018-06-28 ENCOUNTER — Telehealth: Payer: Self-pay

## 2018-06-28 NOTE — Addendum Note (Signed)
Addended by: Mendel Ryder on: 06/28/2018 04:14 PM   Modules accepted: Level of Service, SmartSet

## 2018-06-28 NOTE — Telephone Encounter (Signed)
Pt is scheduled to see Melina Copa PA on 07/31/17. Clearance will be addressed at visit.

## 2018-06-28 NOTE — Telephone Encounter (Signed)
This encounter was created in error - please disregard.

## 2018-06-28 NOTE — Telephone Encounter (Signed)
   Primary Cardiologist:Traci Turner, MD  Chart reviewed as part of pre-operative protocol coverage. Because of Blake [name] past medical history and time since last visit, he/she will require a follow-up visit in order to better assess preoperative cardiovascular risk.  Pre-op covering staff: - Please schedule appointment and call patient to inform them. - Please contact requesting surgeon's office via preferred method (i.e, phone, fax) to inform them of need for appointment prior to surgery.  If applicable, this message will also be routed to pharmacy pool and/or primary cardiologist for input on holding anticoagulant/antiplatelet agent as requested below so that this information is available at time of patient's appointment.   Lyda Jester, PA-C  06/28/2018, 1:59 PM

## 2018-07-06 MED FILL — PROPRANOLOL ER 60 MG CAP: 60 | 30 days supply | Qty: 30 | Fill #2

## 2018-07-06 MED FILL — CYCLOBENZAPRINE 10 MG TAB: 10 | 30 days supply | Qty: 30 | Fill #1

## 2018-07-06 MED FILL — MELOXICAM 15 MG TABLET: 15 | 30 days supply | Qty: 30 | Fill #1

## 2018-07-06 MED FILL — ACETAMINOPHEN/COD #3 TABLET: 300-30 | 14 days supply | Qty: 42 | Fill #0

## 2018-07-06 MED FILL — ?DULoxetine HCL 60MG CPEP: 60 | 30 days supply | Qty: 30 | Fill #2

## 2018-07-06 MED FILL — GABAPENTIN 600 MG TABLET: 600 | 30 days supply | Qty: 180 | Fill #2

## 2018-07-06 MED FILL — !CHANTIX 1 MG TABLET: 1 | 17 days supply | Qty: 34 | Fill #1

## 2018-07-30 ENCOUNTER — Encounter: Payer: Self-pay | Admitting: Physician Assistant

## 2018-07-30 NOTE — Progress Notes (Deleted)
Cardiology Office Note    Date:  07/30/2018  ID:  CACHE BILLS, DOB 1970-10-18, MRN [id number] PCP:  Clent Demark, PA-C  Cardiologist:  Fransico Him, MD   Chief Complaint: pre-op eval  History of Present Illness:  Blake [name] is a 48 y.o. male with history of morbid obesity, tobacco abuse, ED, tremor, PVCs, mildly dilated aorta who presents for pre-op evaluation.  He established with Dr. Radford Pax in 07/2017 for evaluatino for palpitations, SOB, and chest tightness. He was placed on propranolol for his tremor and was also complaining of palpitations  Since going on propranolol for tremors, his palpitations improved. However, he saw neurology and tremor was not felt bad enough to warrant propranolol so it was stopped. Dr. Radford Pax undertook cardiac testing. Event monitor 08/10/17 showed NSR with occasional PVCs. ETT 08/10/17 was negative (mildly impaired exercise tolerance), rare PVCs noted with exercise.  2D Echo 09/28/17 showed mild focal basal hypertrophy of the septum, EF 55-60%, grade 1 DD, mildly dilated aortic root, mild LAE, mild RV dilation. F/u echo in 1 year was recommended. Last labs 2018 Hgb 15.4, K 4.7, Cr 0.90, TSH 1.640, A1C 5.2.  No recent labs if needed ? Sleep study if needed - RV  Pre-op cardiovascular examination PVCs Tobacco abuse Morbid obesity    Past Medical History:  Diagnosis Date  . Arthritis   . Back pain   . Dilated aortic root (HCC)    13mm ascending aorta by echo 09/2017  . ERECTILE DYSFUNCTION, MILD 11/12/2007   Qualifier: Diagnosis of  By: Carolyne Littles    . Morbid obesity ([facility])   . PVC's (premature ventricular contractions)   . TOBACCO ABUSE 11/12/2007   Qualifier: Diagnosis of  By: Carmie End MD, Junie Panning    . Tremor     Past Surgical History:  Procedure Laterality Date  . FACIAL FRACTURE SURGERY      Current Medications: No outpatient medications have been marked as taking for the 07/31/18 encounter (Appointment) with Charlie Pitter, PA-C.   ***    Allergies:   Patient has no known allergies.   Social History   Socioeconomic History  . Marital status: Single    Spouse name: Not on file  . Number of children: Not on file  . Years of education: Not on file  . Highest education level: Not on file  Occupational History  . Not on file  Social Needs  . Financial resource strain: Not on file  . Food insecurity:    Worry: Not on file    Inability: Not on file  . Transportation needs:    Medical: Not on file    Non-medical: Not on file  Tobacco Use  . Smoking status: Current Every Day Smoker    Packs/day: 0.25    Years: 30.00    Pack years: 7.50  . Smokeless tobacco: Never Used  . Tobacco comment: down to 3-4 cigarettes a day   Substance and Sexual Activity  . Alcohol use: Yes    Comment: Drink on weekends  . Drug use: No  . Sexual activity: Not Currently  Lifestyle  . Physical activity:    Days per week: Not on file    Minutes per session: Not on file  . Stress: Not on file  Relationships  . Social connections:    Talks on phone: Not on file    Gets together: Not on file    Attends religious service: Not on file    Active member of  club or organization: Not on file    Attends meetings of clubs or organizations: Not on file    Relationship status: Not on file  Other Topics Concern  . Not on file  Social History Narrative  . Not on file     Family History:  The patient's ***family history includes COPD in his mother; Diabetes in his sister; Hypertension in his sister; Leukemia in his father.  ROS:   Please see the history of present illness. Otherwise, review of systems is positive for ***.  All other systems are reviewed and otherwise negative.    PHYSICAL EXAM:   VS:  There were no vitals taken for this visit.  BMI: There is no height or weight on file to calculate BMI. GEN: Well nourished, well developed, in no acute distress HEENT: normocephalic, atraumatic Neck: no JVD, carotid bruits, or  masses Cardiac: ***RRR; no murmurs, rubs, or gallops, no edema  Respiratory:  clear to auscultation bilaterally, normal work of breathing GI: soft, nontender, nondistended, + BS MS: no deformity or atrophy Skin: warm and dry, no rash Neuro:  Alert and Oriented x 3, Strength and sensation are intact, follows commands Psych: euthymic mood, full affect  Wt Readings from Last 3 Encounters:  06/21/18 (!) 353 lb (160.1 kg)  05/08/18 (!) 353 lb 12.8 oz (160.5 kg)  04/02/18 (!) 360 lb 9.6 oz (163.6 kg)      Studies/Labs Reviewed:   EKG:  EKG was ordered today and personally reviewed by me and demonstrates *** EKG was not ordered today.***  Recent Labs: No results found for requested labs within last 8760 hours.   Lipid Panel    Component Value Date/Time   CHOL 172 12/11/2007 2056   TRIG 112 12/11/2007 2056   HDL 56 12/11/2007 2056   CHOLHDL 3.1 Ratio 12/11/2007 2056   VLDL 22 12/11/2007 2056   LDLCALC 94 12/11/2007 2056    Additional studies/ records that were reviewed today include: Summarized above.***    ASSESSMENT & PLAN:   1. ***  Disposition: F/u with ***   Medication Adjustments/Labs and Tests Ordered: Current medicines are reviewed at length with the patient today.  Concerns regarding medicines are outlined above. Medication changes, Labs and Tests ordered today are summarized above and listed in the Patient Instructions accessible in Encounters.   Signed, Charlie Pitter, PA-C  07/30/2018 10:25 AM    [facility] [place], [place], [place]  [address] Phone: [phone]; Fax: [phone]

## 2018-07-31 ENCOUNTER — Ambulatory Visit: Payer: Self-pay | Admitting: Physician Assistant

## 2018-08-06 MED FILL — PROPRANOLOL ER 60 MG CAP: 60 | 30 days supply | Qty: 30 | Fill #3

## 2018-08-06 MED FILL — ?DULoxetine HCL 60MG CPEP: 60 | 30 days supply | Qty: 30 | Fill #3

## 2018-08-07 ENCOUNTER — Ambulatory Visit (INDEPENDENT_AMBULATORY_CARE_PROVIDER_SITE_OTHER): Payer: Self-pay | Admitting: Orthopaedic Surgery

## 2018-08-07 ENCOUNTER — Encounter (INDEPENDENT_AMBULATORY_CARE_PROVIDER_SITE_OTHER): Payer: Self-pay | Admitting: Orthopaedic Surgery

## 2018-08-07 VITALS — BP 143/94 | HR 90 | Ht 75.0 in | Wt 370.0 lb

## 2018-08-07 DIAGNOSIS — M545 Low back pain, unspecified: Secondary | ICD-10-CM

## 2018-08-07 DIAGNOSIS — M25551 Pain in right hip: Secondary | ICD-10-CM

## 2018-08-07 DIAGNOSIS — G8929 Other chronic pain: Secondary | ICD-10-CM

## 2018-08-07 MED FILL — GABAPENTIN 600 MG TABLET: 600 | 30 days supply | Qty: 180 | Fill #3

## 2018-08-07 NOTE — Progress Notes (Signed)
Office Visit Note   Patient: Blake [name]           Date of Birth: 1971-03-19           MRN: [id number] Visit Date: 08/07/2018              Requested by: Clent Demark, PA-C No address on file PCP: Clent Demark, PA-C   Assessment & Plan: Visit Diagnoses:  1. Chronic bilateral low back pain without sciatica   2. Pain of right hip joint     Plan: Osteoarthritis right hip.  Will make appointment to see Dr. Ninfa Linden regarding an anterior approach based on his side.  Also has chronic low back pain with an MRI scan demonstrating facet arthritic changes and marrow edema at L4-5.  Might be worth having facet injections before he has surgery.  No prescribed analgesics  Follow-Up Instructions: Return will schedule appt with Dr Ninfa Linden.   Orders:  No orders of the defined types were placed in this encounter.  No orders of the defined types were placed in this encounter.     Procedures: No procedures performed   Clinical Data: No additional findings.   Subjective: Chief Complaint  Patient presents with  . Right Hip - Follow-up  . Hip Pain    discuss possible surgery , or injection, and needs an rx for pain meds, feel like the pain has gotten wrose   [name] was last seen in the summer 2019 for evaluation of chronic low back pain and right hip pain.  He has evidence of osteoarthritis of his right hip.  We had scheduled an appointment for him to see Dr. Ninfa Linden but he had some problems with "my insurance".  He relates that everything is "fine now and will reset that appointment for consideration of an anterior approach.  He is in the midst of medical clearance for his procedure  He also has had issues with chronic back pain.  He has had an MRI scan with and without contrast.  He does have lipomatosis in the lumbar spine but without any specific mass formation.  He does have bilateral L4-5 facet changes with marrow edema.  Not having much referred pain to either lower  extremity.  No bowel or bladder changes  HPI  Review of Systems   Objective: Vital Signs: BP (!) 143/94   Pulse 90   Ht 6\' 3"  (1.905 m)   Wt (!) 370 lb (167.8 kg)   BMI 46.25 kg/m   Physical Exam Constitutional:      Appearance: He is well-developed.  Eyes:     Pupils: Pupils are equal, round, and reactive to light.  Pulmonary:     Effort: Pulmonary effort is normal.  Skin:    General: Skin is warm and dry.  Neurological:     Mental Status: He is alert and oriented to person, place, and time.  Psychiatric:        Behavior: Behavior normal.     Ortho Exam awake alert and oriented x3.  Comfortable sitting up does have a slight limp when he first gets up from a sitting position referable to his right hip.  Does have pain in the extreme of internal and external rotation of his right hip.  Straight leg raise negative.  No percussible tenderness of lumbar spine  Specialty Comments:  No specialty comments available.  Imaging: No results found.   PMFS History: Patient Active Problem List   Diagnosis Date Noted  .  Chronic bilateral low back pain with bilateral sciatica 04/02/2018  . Epidural lipomatosis 04/02/2018  . Right hip pain 04/02/2018  . Preop cardiovascular exam 03/30/2018  . Dilated aortic root ([place])   . Chest pain 07/27/2017  . Palpitations 07/26/2017  . ERECTILE DYSFUNCTION, MILD 11/12/2007  . Tobacco abuse 11/12/2007   Past Medical History:  Diagnosis Date  . Arthritis   . Back pain   . Dilated aortic root ([facility])    49mm ascending aorta by echo 09/2017  . ERECTILE DYSFUNCTION, MILD 11/12/2007   Qualifier: Diagnosis of  By: Carolyne Littles    . Morbid obesity ([place])   . PVC's (premature ventricular contractions)   . TOBACCO ABUSE 11/12/2007   Qualifier: Diagnosis of  By: Carmie End MD, Junie Panning    . Tremor     Family History  Problem Relation Age of Onset  . COPD Mother   . Leukemia Father   . Hypertension Sister   . Diabetes Sister     Past Surgical History:   Procedure Laterality Date  . FACIAL FRACTURE SURGERY     Social History   Occupational History  . Not on file  Tobacco Use  . Smoking status: Current Every Day Smoker    Packs/day: 0.25    Years: 30.00    Pack years: 7.50  . Smokeless tobacco: Never Used  . Tobacco comment: down to 3-4 cigarettes a day   Substance and Sexual Activity  . Alcohol use: Yes    Comment: Drink on weekends  . Drug use: No  . Sexual activity: Not Currently     Garald Balding, MD   Note - This record has been created using [employer].  Chart creation errors have been sought, but may not always  have been located. Such creation errors do not reflect on  the standard of medical care.

## 2018-08-08 ENCOUNTER — Telehealth (INDEPENDENT_AMBULATORY_CARE_PROVIDER_SITE_OTHER): Payer: Self-pay | Admitting: Orthopaedic Surgery

## 2018-08-08 ENCOUNTER — Other Ambulatory Visit (INDEPENDENT_AMBULATORY_CARE_PROVIDER_SITE_OTHER): Payer: Self-pay | Admitting: *Deleted

## 2018-08-08 NOTE — Telephone Encounter (Signed)
I called patient, letter mail, may take up to 2 weeks to receive.

## 2018-08-08 NOTE — Telephone Encounter (Signed)
Patient needs note stating he had an appt in our office on 08/07/2018, mailed to his home address.

## 2018-08-13 ENCOUNTER — Encounter (INDEPENDENT_AMBULATORY_CARE_PROVIDER_SITE_OTHER): Payer: Self-pay | Admitting: Orthopaedic Surgery

## 2018-08-13 ENCOUNTER — Ambulatory Visit (INDEPENDENT_AMBULATORY_CARE_PROVIDER_SITE_OTHER): Payer: Self-pay | Admitting: Orthopaedic Surgery

## 2018-08-13 VITALS — Ht 75.0 in | Wt 370.0 lb

## 2018-08-13 DIAGNOSIS — M5441 Lumbago with sciatica, right side: Secondary | ICD-10-CM

## 2018-08-13 DIAGNOSIS — M5442 Lumbago with sciatica, left side: Secondary | ICD-10-CM

## 2018-08-13 DIAGNOSIS — G8929 Other chronic pain: Secondary | ICD-10-CM

## 2018-08-13 DIAGNOSIS — M25551 Pain in right hip: Secondary | ICD-10-CM

## 2018-08-13 DIAGNOSIS — M1611 Unilateral primary osteoarthritis, right hip: Secondary | ICD-10-CM

## 2018-08-13 DIAGNOSIS — D1779 Benign lipomatous neoplasm of other sites: Secondary | ICD-10-CM

## 2018-08-13 MED ORDER — ACETAMINOPHEN-CODEINE #3 300-30 MG PO TABS: 1.0000 | ORAL_TABLET | Freq: Three times a day (TID) | ORAL | 0 refills | Status: DC | PRN

## 2018-08-13 MED FILL — ACETAMINOPHEN/COD #3 TABLET: 300-30 | 14 days supply | Qty: 42 | Fill #0

## 2018-08-13 NOTE — Progress Notes (Signed)
Office Visit Note   Patient: Blake [name]           Date of Birth: 31-Dec-1970           MRN: [id number] Visit Date: 08/13/2018              Requested by: Clent Demark, PA-C No address on file PCP: Clent Demark, PA-C   Assessment & Plan: Visit Diagnoses:  1. Unilateral primary osteoarthritis, right hip   2. Chronic bilateral low back pain with bilateral sciatica   3. Epidural lipomatosis   4. Right hip pain     Plan: We did talk in detail about hip replacement surgery.  I showed him his x-rays and went over hip model and explained in detail what the surgery involves.  His biggest risk is his obesity and the fact that he can wear out these components.  There are certainly a high risk of soft tissue issues and blood loss given his size.  However when I laid him supine I did not feel uncomfortable for him in the surgery based on his size because I think this not to be difficult.  I think the biggest challenge will be him losing weight and not having this implant wear out in the future.  We had a long and thorough discussion about this.  He does wish to proceed to see if this would help him decrease his pain in order to improve his mobility and quality of life and so he can eventually be someone who contributes to society not being able to work again when he is not having so much pain.  We will work on getting this scheduled in the near future.  All question concerns were answered and addressed.  I did refill his Tylenol 3.  The patient meets the AMA guidelines for Morbid (severe) obesity with a BMI > 40.0 and I have recommended weight loss.  Follow-Up Instructions: Return for 2 weeks post-op.   Orders:  No orders of the defined types were placed in this encounter.  Meds ordered this encounter  Medications  . DISCONTD: acetaminophen-codeine (TYLENOL #3) 300-30 MG tablet    Sig: Take 1 tablet by mouth every 8 (eight) hours as needed for moderate pain.    Dispense:  42 tablet      Refill:  0    For chronic pain.  Marland Kitchen acetaminophen-codeine (TYLENOL #3) 300-30 MG tablet    Sig: Take 1 tablet by mouth every 8 (eight) hours as needed for moderate pain.    Dispense:  42 tablet    Refill:  0    For chronic pain.      Procedures: No procedures performed   Clinical Data: No additional findings.   Subjective: Chief Complaint  Patient presents with  . Right Hip - Pain  The patient comes in today for evaluation treatment of severe arthritis of his right hip.  He is actually referred by 1 of my partners Dr. Durward Fortes to consider him for direct anterior hip replacement.  He is someone who is unemployed.  He has had dental issues in the past but he said his teeth pulled.  He is someone with a BMI of over 40 however he is very tall individual.  He does not work at the moment due to severe right hip pain and low back pain.  He has been on cyclobenzaprine and Tylenol 3.  He walks with a limp.  His pain is daily and at this point  it is detrimentally affecting his mobility, his quality of life and his activities daily living.  He is worked on weight loss and activity modification.  He occasionally uses a cane to get around.  His pain is daily and it is detrimentally affect his mobility, his quality of life and his activities daily living to the point he does wish to proceed with a total hip arthroplasty.  He understands that with his BMI being high that he is at heightened risk of complications.  HPI  Review of Systems He currently denies any headache, chest pain, shortness of breath, fever, chills, nausea.  Objective: Vital Signs: Ht 6\' 3"  (1.905 m)   Wt (!) 370 lb (167.8 kg)   BMI 46.25 kg/m   Physical Exam He is alert and orient x3 and in no acute distress Ortho Exam Examination of his left hip shows a little bit of pain with internal X rotation.  Examination of the right hip shows severe pain in the groin with internal and external rotation with stiffness on rotation  as well.  I did have him lay in a supine position on exam table.  I was able to easily mobilize his abdomen and see the soft tissue plane getting down to the hip and his thigh.  I believe the acetabular component will be easier with the thigh component may be difficult for [place] to the fact he is only 27 and does have no strong thigh musculature. Specialty Comments:  No specialty comments available.  Imaging: No results found.   PMFS History: Patient Active Problem List   Diagnosis Date Noted  . Unilateral primary osteoarthritis, right hip 08/13/2018  . Chronic bilateral low back pain with bilateral sciatica 04/02/2018  . Epidural lipomatosis 04/02/2018  . Right hip pain 04/02/2018  . Preop cardiovascular exam 03/30/2018  . Dilated aortic root ([place])   . Chest pain 07/27/2017  . Palpitations 07/26/2017  . ERECTILE DYSFUNCTION, MILD 11/12/2007  . Tobacco abuse 11/12/2007   Past Medical History:  Diagnosis Date  . Arthritis   . Back pain   . Dilated aortic root ([facility])    6mm ascending aorta by echo 09/2017  . ERECTILE DYSFUNCTION, MILD 11/12/2007   Qualifier: Diagnosis of  By: Carolyne Littles    . Morbid obesity ([place])   . PVC's (premature ventricular contractions)   . TOBACCO ABUSE 11/12/2007   Qualifier: Diagnosis of  By: Carmie End MD, Junie Panning    . Tremor     Family History  Problem Relation Age of Onset  . COPD Mother   . Leukemia Father   . Hypertension Sister   . Diabetes Sister     Past Surgical History:  Procedure Laterality Date  . FACIAL FRACTURE SURGERY     Social History   Occupational History  . Not on file  Tobacco Use  . Smoking status: Current Every Day Smoker    Packs/day: 0.25    Years: 30.00    Pack years: 7.50  . Smokeless tobacco: Never Used  . Tobacco comment: down to 3-4 cigarettes a day   Substance and Sexual Activity  . Alcohol use: Yes    Comment: Drink on weekends  . Drug use: No  . Sexual activity: Not Currently

## 2018-08-22 NOTE — Progress Notes (Signed)
Cardiology Office Note    Date:  08/24/2018  ID:  Blake [name], DOB 10/16/1970, MRN [id number] PCP:  Clent Demark, PA-C  Cardiologist:  Fransico Him, MD   Chief Complaint: pre-op eval  History of Present Illness:  Blake [name] is a 48 y.o. male with history of morbid obesity, tobacco abuse, ED, tremor, PVCs, mildly dilated aorta who presents for pre-op evaluation.   He established with Dr. Radford Pax in 07/2017 for evaluation for palpitations, SOB, and chest tightness. He was placed on propranolol for his tremor and was also complaining of palpitations Since going on propranolol for tremors, his palpitations improved. Dr. Radford Pax undertook cardiac testing. Event monitor 08/10/17 showed NSR with occasional PVCs. ETT 08/10/17 was negative (mildly impaired exercise tolerance), rare PVCs noted with exercise. 2D Echo 09/28/17 showed mild focal basal hypertrophy of the septum, EF 55-60%, grade 1 DD, mildly dilated aortic root, mild LAE, mild RV dilation. F/u echo in 1 year was recommended. Last labs 2018 Hgb 15.4, K 4.7, Cr 0.90, TSH 1.640, A1C 5.2.   He returns for follow-up doing great from CV standpoint. No CP, SOB, palpitations, syncope, edema, orthopnea. He is pending sleep study for eval of OSA. Has lost 9lb since last visit. He is also trying to quit smoking. He does not exercise regularly due to right hip pain but has no anginal symptoms with ADLs and walking.  Past Medical History:  Diagnosis Date  . Arthritis   . Back pain   . Dilated aortic root ([facility])    18mm ascending aorta by echo 09/2017  . ERECTILE DYSFUNCTION, MILD 11/12/2007   Qualifier: Diagnosis of  By: Carolyne Littles    . Morbid obesity ([place])   . PVC's (premature ventricular contractions)   . TOBACCO ABUSE 11/12/2007   Qualifier: Diagnosis of  By: Carmie End MD, Junie Panning    . Tremor     Past Surgical History:  Procedure Laterality Date  . FACIAL FRACTURE SURGERY      Current Medications: Current Meds  Medication Sig  .  acetaminophen-codeine (TYLENOL #3) 300-30 MG tablet Take 1 tablet by mouth every 8 (eight) hours as needed for moderate pain.  . DULoxetine (CYMBALTA) 60 MG capsule Take 1 capsule (60 mg total) by mouth daily.  Marland Kitchen gabapentin (NEURONTIN) 600 MG tablet Take 2 tablets (1,200 mg total) by mouth 3 (three) times daily.  . meloxicam (MOBIC) 15 MG tablet Take 1 tablet (15 mg total) by mouth daily.  . propranolol ER (INDERAL LA) 60 MG 24 hr capsule Take 1 capsule (60 mg total) by mouth daily.  . varenicline (CHANTIX CONTINUING MONTH PAK) 1 MG tablet Take 1 tablet (1 mg total) by mouth 2 (two) times daily.    Allergies:   Patient has no known allergies.   Social History   Socioeconomic History  . Marital status: Single    Spouse name: Not on file  . Number of children: Not on file  . Years of education: Not on file  . Highest education level: Not on file  Occupational History  . Not on file  Social Needs  . Financial resource strain: Not on file  . Food insecurity:    Worry: Not on file    Inability: Not on file  . Transportation needs:    Medical: Not on file    Non-medical: Not on file  Tobacco Use  . Smoking status: Current Every Day Smoker    Packs/day: 0.25    Years: 30.00  Pack years: 7.50  . Smokeless tobacco: Never Used  . Tobacco comment: down to 3-4 cigarettes a day   Substance and Sexual Activity  . Alcohol use: Yes    Comment: Drink on weekends  . Drug use: No  . Sexual activity: Not Currently  Lifestyle  . Physical activity:    Days per week: Not on file    Minutes per session: Not on file  . Stress: Not on file  Relationships  . Social connections:    Talks on phone: Not on file    Gets together: Not on file    Attends religious service: Not on file    Active member of club or organization: Not on file    Attends meetings of clubs or organizations: Not on file    Relationship status: Not on file  Other Topics Concern  . Not on file  Social History Narrative   . Not on file     Family History:  The patient's family history includes COPD in his mother; Diabetes in his sister; Hypertension in his sister; Leukemia in his father.  ROS:   Please see the history of present illness.  All other systems are reviewed and otherwise negative.    PHYSICAL EXAM:   VS:  BP 120/90   Pulse 81   Ht 6\' 3"  (1.905 m)   Wt (!) 361 lb 12.8 oz (164.1 kg)   BMI 45.22 kg/m   BMI: Body mass index is 45.22 kg/m. GEN: Well nourished, well developed morbidly obese AAM in no acute distress HEENT: normocephalic, atraumatic Neck: no JVD, carotid bruits, or masses Cardiac: RRR; no murmurs, rubs, or gallops, no edema  Respiratory:  clear to auscultation bilaterally, normal work of breathing GI: soft, nontender, nondistended, + BS MS: no deformity or atrophy Skin: warm and dry, no rash Neuro:  Alert and Oriented x 3, Strength and sensation are intact, follows commands Psych: euthymic mood, full affect  Wt Readings from Last 3 Encounters:  08/24/18 (!) 361 lb 12.8 oz (164.1 kg)  08/13/18 (!) 370 lb (167.8 kg)  08/07/18 (!) 370 lb (167.8 kg)      Studies/Labs Reviewed:   EKG:  EKG was ordered today and personally reviewed by me and demonstrates NSR 81bpm no acute ST- Changes. Unremarkable.  Recent Labs: No results found for requested labs within last 8760 hours.   Lipid Panel    Component Value Date/Time   CHOL 172 12/11/2007 2056   TRIG 112 12/11/2007 2056   HDL 56 12/11/2007 2056   CHOLHDL 3.1 Ratio 12/11/2007 2056   VLDL 22 12/11/2007 2056   LDLCALC 94 12/11/2007 2056    Additional studies/ records that were reviewed today include: Summarized above   ASSESSMENT & PLAN:   1. Pre-op cardiovascular examination - revised cardiac risk index is 0.4% indicating low risk of CV complications. Stress test in 07/2017 was negative for ischemia. Therefore, based on ACC/AHA guidelines, Blake [name] would be at acceptable risk for the planned procedure  without further cardiovascular testing. Would recommend attention to breathing status during surgery as he does not have formal diagnosis for OSA, but is pending evaluation for this. Given his mildly dilated RV on prior echo and BMI, I would have high suspicion to act act as he does in fact have it. We discussed importance of keeping sleep study appt and working on weight loss long term. I'll route this chart to Dr. Trevor Mace office - we called their office and were given fax #  of [phone]. 2. PVCs  - quiescent on propranolol. Would continue beta blocker perioperatively. 3. Tobacco abuse - he is using Chantix to quit and is really working hard to do so. 4. Dilated aortic root - repeat echo 09/2018. Do not feel this would impact pre-op clearance.  5. Morbid obesity - as above, discussed importance of longterm weight loss.  Disposition: F/u with Dr. Radford Pax in 1 year.  Medication Adjustments/Labs and Tests Ordered: Current medicines are reviewed at length with the patient today.  Concerns regarding medicines are outlined above. Medication changes, Labs and Tests ordered today are summarized above and listed in the Patient Instructions accessible in Encounters.   Signed, Charlie Pitter, PA-C  08/24/2018 10:47 AM    [facility] [address], [place], [place]  [address] Phone: [phone]; Fax: [phone]

## 2018-08-24 ENCOUNTER — Ambulatory Visit (INDEPENDENT_AMBULATORY_CARE_PROVIDER_SITE_OTHER): Payer: Self-pay | Admitting: Physician Assistant

## 2018-08-24 ENCOUNTER — Encounter: Payer: Self-pay | Admitting: Physician Assistant

## 2018-08-24 VITALS — BP 120/90 | HR 81 | Ht 75.0 in | Wt 361.8 lb

## 2018-08-24 DIAGNOSIS — Z720 Tobacco use: Secondary | ICD-10-CM

## 2018-08-24 DIAGNOSIS — I77810 Thoracic aortic ectasia: Secondary | ICD-10-CM

## 2018-08-24 DIAGNOSIS — I493 Ventricular premature depolarization: Secondary | ICD-10-CM

## 2018-08-24 DIAGNOSIS — Z01810 Encounter for preprocedural cardiovascular examination: Secondary | ICD-10-CM

## 2018-08-24 NOTE — Patient Instructions (Signed)
Medication Instructions:  Your physician recommends that you continue on your current medications as directed. Please refer to the Current Medication list given to you today.  If you need a refill on your cardiac medications before your next appointment, please call your pharmacy.   Lab work: None ordered  If you have labs (blood work) drawn today and your tests are completely normal, you will receive your results only by: Marland Kitchen MyChart Message (if you have MyChart) OR . A paper copy in the mail If you have any lab test that is abnormal or we need to change your treatment, we will call you to review the results.  Testing/Procedures: Your physician has requested that you have an echocardiogram. Echocardiography is a painless test that uses sound waves to create images of your heart. It provides your doctor with information about the size and shape of your heart and how well your heart's chambers and valves are working. This procedure takes approximately one hour. There are no restrictions for this procedure.    Follow-Up: At [facility], you and your health needs are our priority.  As part of our continuing mission to provide you with exceptional heart care, we have created designated Provider Care Teams.  These Care Teams include your primary Cardiologist (physician) and Advanced Practice Providers (APPs -  Physician Assistants and Nurse Practitioners) who all work together to provide you with the care you need, when you need it. You will need a follow up appointment in 12 months.  Please call our office 2 months in advance to schedule this appointment.  You may see Fransico Him, MD or one of the following Advanced Practice Providers on your designated Care Team:   [place], PA-C Melina Copa, PA-C . Ermalinda Barrios, PA-C  Any Other Special Instructions Will Be Listed Below (If Applicable).  Echocardiogram An echocardiogram is a procedure that uses painless sound waves (ultrasound) to  produce an image of the heart. Images from an echocardiogram can provide important information about:  Signs of coronary artery disease (CAD).  Aneurysm detection. An aneurysm is a weak or damaged part of an artery wall that bulges out from the normal force of blood pumping through the body.  Heart size and shape. Changes in the size or shape of the heart can be associated with certain conditions, including heart failure, aneurysm, and CAD.  Heart muscle function.  Heart valve function.  Signs of a past heart attack.  Fluid buildup around the heart.  Thickening of the heart muscle.  A tumor or infectious growth around the heart valves. Tell a health care provider about:  Any allergies you have.  All medicines you are taking, including vitamins, herbs, eye drops, creams, and over-the-counter medicines.  Any blood disorders you have.  Any surgeries you have had.  Any medical conditions you have.  Whether you are pregnant or may be pregnant. What are the risks? Generally, this is a safe procedure. However, problems may occur, including:  Allergic reaction to dye (contrast) that may be used during the procedure. What happens before the procedure? No specific preparation is needed. You may eat and drink normally. What happens during the procedure?   An IV tube may be inserted into one of your veins.  You may receive contrast through this tube. A contrast is an injection that improves the quality of the pictures from your heart.  A gel will be applied to your chest.  A wand-like tool (transducer) will be moved over your chest. The  gel will help to transmit the sound waves from the transducer.  The sound waves will harmlessly bounce off of your heart to allow the heart images to be captured in real-time motion. The images will be recorded on a computer. The procedure may vary among health care providers and hospitals. What happens after the procedure?  You may return to  your normal, everyday life, including diet, activities, and medicines, unless your health care provider tells you not to do that. Summary  An echocardiogram is a procedure that uses painless sound waves (ultrasound) to produce an image of the heart.  Images from an echocardiogram can provide important information about the size and shape of your heart, heart muscle function, heart valve function, and fluid buildup around your heart.  You do not need to do anything to prepare before this procedure. You may eat and drink normally.  After the echocardiogram is completed, you may return to your normal, everyday life, unless your health care provider tells you not to do that. This information is not intended to replace advice given to you by your health care provider. Make sure you discuss any questions you have with your health care provider. Document Released: 06/24/2000 Document Revised: 07/30/2016 Document Reviewed: 07/30/2016 Elsevier Interactive Patient Education  2019 [employer].

## 2018-08-28 ENCOUNTER — Other Ambulatory Visit (INDEPENDENT_AMBULATORY_CARE_PROVIDER_SITE_OTHER): Payer: Self-pay | Admitting: Physician Assistant

## 2018-08-31 ENCOUNTER — Telehealth (INDEPENDENT_AMBULATORY_CARE_PROVIDER_SITE_OTHER): Payer: Self-pay | Admitting: Orthopaedic Surgery

## 2018-08-31 ENCOUNTER — Ambulatory Visit (HOSPITAL_COMMUNITY): Payer: Self-pay | Attending: Cardiology

## 2018-08-31 DIAGNOSIS — I77810 Thoracic aortic ectasia: Secondary | ICD-10-CM | POA: Insufficient documentation

## 2018-08-31 NOTE — Telephone Encounter (Signed)
Patient called asked if a letter can be emailed stating he is having surgery and how long he will be out of work. The email address is [email or web address]. The number to contact patient is [phone]

## 2018-09-03 ENCOUNTER — Encounter (INDEPENDENT_AMBULATORY_CARE_PROVIDER_SITE_OTHER): Payer: Self-pay

## 2018-09-03 NOTE — Telephone Encounter (Signed)
Letter emailed

## 2018-09-03 NOTE — Telephone Encounter (Signed)
I just made this note under "letters" section, can you email for me?

## 2018-09-03 NOTE — Progress Notes (Signed)
LOV/CARDIAC CLEARANCE, DAYNA DUNNPA-C 08-24-2018 Epic   EKG 08-24-2018 Epic   ECHO 08-31-2018 Epic

## 2018-09-03 NOTE — Patient Instructions (Signed)
BLANE WORTHINGTON  09/03/2018   Your procedure is scheduled on: 09-07-2018   Report to [facility] Main  Entrance    Report to admitting at 7:15AM    Call this number if you have problems the morning of surgery [phone]     Remember: Do not eat food or drink liquids :After Midnight. BRUSH YOUR TEETH MORNING OF SURGERY AND RINSE YOUR MOUTH OUT, NO CHEWING GUM CANDY OR MINTS.     Take these medicines the morning of surgery with A SIP OF WATER: duloxetine, gabapentin, propanolol                                 You may not have any metal on your body including hair pins and              piercings  Do not wear jewelry, make-up, lotions, powders or perfumes, deodorant                      Men may shave face and neck.   Do not bring valuables to the hospital. [place].  Contacts, dentures or bridgework may not be worn into surgery.  Leave suitcase in the car. After surgery it may be brought to your room.                   Please read over the following fact sheets you were given: _____________________________________________________________________             [facility] - Preparing for Surgery Before surgery, you can play an important role.  Because skin is not sterile, your skin needs to be as free of germs as possible.  You can reduce the number of germs on your skin by washing with CHG (chlorahexidine gluconate) soap before surgery.  CHG is an antiseptic cleaner which kills germs and bonds with the skin to continue killing germs even after washing. Please DO NOT use if you have an allergy to CHG or antibacterial soaps.  If your skin becomes reddened/irritated stop using the CHG and inform your nurse when you arrive at Short Stay. Do not shave (including legs and underarms) for at least 48 hours prior to the first CHG shower.  You may shave your face/neck. Please follow these instructions carefully:  1.   Shower with CHG Soap the night before surgery and the  morning of Surgery.  2.  If you choose to wash your hair, wash your hair first as usual with your  normal  shampoo.  3.  After you shampoo, rinse your hair and body thoroughly to remove the  shampoo.                           4.  Use CHG as you would any other liquid soap.  You can apply chg directly  to the skin and wash                       Gently with a scrungie or clean washcloth.  5.  Apply the CHG Soap to your body ONLY FROM THE NECK DOWN.   Do not use on face/ open  Wound or open sores. Avoid contact with eyes, ears mouth and genitals (private parts).                       Wash face,  Genitals (private parts) with your normal soap.             6.  Wash thoroughly, paying special attention to the area where your surgery  will be performed.  7.  Thoroughly rinse your body with warm water from the neck down.  8.  DO NOT shower/wash with your normal soap after using and rinsing off  the CHG Soap.                9.  Pat yourself dry with a clean towel.            10.  Wear clean pajamas.            11.  Place clean sheets on your bed the night of your first shower and do not  sleep with pets. Day of Surgery : Do not apply any lotions/deodorants the morning of surgery.  Please wear clean clothes to the hospital/surgery center.  FAILURE TO FOLLOW THESE INSTRUCTIONS MAY RESULT IN THE CANCELLATION OF YOUR SURGERY PATIENT SIGNATURE_________________________________  NURSE SIGNATURE__________________________________  ________________________________________________________________________   Adam Phenix  An incentive spirometer is a tool that can help keep your lungs clear and active. This tool measures how well you are filling your lungs with each breath. Taking long deep breaths may help reverse or decrease the chance of developing breathing (pulmonary) problems (especially infection) following:  A long  period of time when you are unable to move or be active. BEFORE THE PROCEDURE   If the spirometer includes an indicator to show your best effort, your nurse or respiratory therapist will set it to a desired goal.  If possible, sit up straight or lean slightly forward. Try not to slouch.  Hold the incentive spirometer in an upright position. INSTRUCTIONS FOR USE  1. Sit on the edge of your bed if possible, or sit up as far as you can in bed or on a chair. 2. Hold the incentive spirometer in an upright position. 3. Breathe out normally. 4. Place the mouthpiece in your mouth and seal your lips tightly around it. 5. Breathe in slowly and as deeply as possible, raising the piston or the ball toward the top of the column. 6. Hold your breath for 3-5 seconds or for as long as possible. Allow the piston or ball to fall to the bottom of the column. 7. Remove the mouthpiece from your mouth and breathe out normally. 8. Rest for a few seconds and repeat Steps 1 through 7 at least 10 times every 1-2 hours when you are awake. Take your time and take a few normal breaths between deep breaths. 9. The spirometer may include an indicator to show your best effort. Use the indicator as a goal to work toward during each repetition. 10. After each set of 10 deep breaths, practice coughing to be sure your lungs are clear. If you have an incision (the cut made at the time of surgery), support your incision when coughing by placing a pillow or rolled up towels firmly against it. Once you are able to get out of bed, walk around indoors and cough well. You may stop using the incentive spirometer when instructed by your caregiver.  RISKS AND COMPLICATIONS  Take your time so you do not get  dizzy or light-headed.  If you are in pain, you may need to take or ask for pain medication before doing incentive spirometry. It is harder to take a deep breath if you are having pain. AFTER USE  Rest and breathe slowly and  easily.  It can be helpful to keep track of a log of your progress. Your caregiver can provide you with a simple table to help with this. If you are using the spirometer at home, follow these instructions: [place] IF:   You are having difficultly using the spirometer.  You have trouble using the spirometer as often as instructed.  Your pain medication is not giving enough relief while using the spirometer.  You develop fever of 100.5 F (38.1 C) or higher. SEEK IMMEDIATE MEDICAL CARE IF:   You cough up bloody sputum that had not been present before.  You develop fever of 102 F (38.9 C) or greater.  You develop worsening pain at or near the incision site. MAKE SURE YOU:   Understand these instructions.  Will watch your condition.  Will get help right away if you are not doing well or get worse. Document Released: 11/07/2006 Document Revised: 09/19/2011 Document Reviewed: 01/08/2007 [facility] Patient Information 2014 [place], [place].   ________________________________________________________________________

## 2018-09-05 ENCOUNTER — Encounter (HOSPITAL_COMMUNITY)
Admission: RE | Admit: 2018-09-05 | Discharge: 2018-09-05 | Disposition: A | Discharge status: Home / Self Care | Payer: Medicaid Other | Source: Ambulatory Visit | Attending: Orthopaedic Surgery | Admitting: Orthopaedic Surgery

## 2018-09-05 ENCOUNTER — Other Ambulatory Visit: Payer: Self-pay

## 2018-09-05 ENCOUNTER — Encounter (HOSPITAL_COMMUNITY): Payer: Self-pay

## 2018-09-05 DIAGNOSIS — M1611 Unilateral primary osteoarthritis, right hip: Secondary | ICD-10-CM | POA: Insufficient documentation

## 2018-09-05 DIAGNOSIS — Z01812 Encounter for preprocedural laboratory examination: Secondary | ICD-10-CM | POA: Insufficient documentation

## 2018-09-05 HISTORY — DX: Personal history of other medical treatment: Z92.89

## 2018-09-05 LAB — BASIC METABOLIC PANEL
Anion gap: 8 (ref 5–15)
BUN: 16 mg/dL (ref 6–20)
CO2: 22 mmol/L (ref 22–32)
Calcium: 9.1 mg/dL (ref 8.9–10.3)
Chloride: 107 mmol/L (ref 98–111)
Creatinine, Ser: 1.07 mg/dL (ref 0.61–1.24)
GFR calc Af Amer: >60 mL/min (ref >60)
GFR calc non Af Amer: >60 mL/min (ref >60)
Glucose, Bld: 98 mg/dL (ref 70–99)
Potassium: 4.6 mmol/L (ref 3.5–5.1)
Sodium: 137 mmol/L (ref 135–145)

## 2018-09-05 LAB — CBC
HCT: 48.1 % (ref 39.0–52.0)
Hemoglobin: 15.1 g/dL (ref 13.0–17.0)
MCH: 31 pg (ref 26.0–34.0)
MCHC: 31.4 g/dL (ref 30.0–36.0)
MCV: 98.8 fL (ref 80.0–100.0)
Platelets: 241 10*3/uL (ref 150–400)
RBC: 4.87 MIL/uL (ref 4.22–5.81)
RDW: 14.6 % (ref 11.5–15.5)
WBC: 6.9 10*3/uL (ref 4.0–10.5)
nRBC: 0 % (ref 0.0–0.2)

## 2018-09-05 LAB — SURGICAL PCR SCREEN
MRSA, PCR: NEGATIVE
Staphylococcus aureus: NEGATIVE

## 2018-09-06 MED ORDER — DEXTROSE 5 % IV SOLN
3.0000 g | INTRAVENOUS | Status: AC
  Administered 2018-09-07: 3 g via INTRAVENOUS
  Filled 2018-09-06: qty 3

## 2018-09-07 ENCOUNTER — Inpatient Hospital Stay (HOSPITAL_COMMUNITY): Payer: Self-pay | Admitting: Anesthesiology

## 2018-09-07 ENCOUNTER — Inpatient Hospital Stay (HOSPITAL_COMMUNITY): Payer: Self-pay | Admitting: Physician Assistant

## 2018-09-07 ENCOUNTER — Other Ambulatory Visit: Payer: Self-pay

## 2018-09-07 ENCOUNTER — Encounter (HOSPITAL_COMMUNITY)
Admission: RE | Disposition: A | Discharge status: Home / Self Care | Payer: Self-pay | Source: Home / Self Care | Attending: Orthopaedic Surgery

## 2018-09-07 ENCOUNTER — Observation Stay (HOSPITAL_COMMUNITY): Payer: Self-pay

## 2018-09-07 ENCOUNTER — Encounter (HOSPITAL_COMMUNITY): Payer: Self-pay

## 2018-09-07 ENCOUNTER — Inpatient Hospital Stay (HOSPITAL_COMMUNITY): Payer: Self-pay

## 2018-09-07 ENCOUNTER — Observation Stay (HOSPITAL_COMMUNITY)
Admission: RE | Admit: 2018-09-07 | Discharge: 2018-09-09 | Disposition: A | Discharge status: Home / Self Care | Payer: Self-pay | Attending: Orthopaedic Surgery | Admitting: Orthopaedic Surgery

## 2018-09-07 DIAGNOSIS — R2681 Unsteadiness on feet: Secondary | ICD-10-CM | POA: Insufficient documentation

## 2018-09-07 DIAGNOSIS — Z96641 Presence of right artificial hip joint: Secondary | ICD-10-CM

## 2018-09-07 DIAGNOSIS — Z79899 Other long term (current) drug therapy: Secondary | ICD-10-CM | POA: Insufficient documentation

## 2018-09-07 DIAGNOSIS — Z6841 Body Mass Index (BMI) 40.0 and over, adult: Secondary | ICD-10-CM | POA: Insufficient documentation

## 2018-09-07 DIAGNOSIS — Z791 Long term (current) use of non-steroidal anti-inflammatories (NSAID): Secondary | ICD-10-CM | POA: Insufficient documentation

## 2018-09-07 DIAGNOSIS — R2689 Other abnormalities of gait and mobility: Secondary | ICD-10-CM | POA: Insufficient documentation

## 2018-09-07 DIAGNOSIS — F17210 Nicotine dependence, cigarettes, uncomplicated: Secondary | ICD-10-CM | POA: Insufficient documentation

## 2018-09-07 DIAGNOSIS — Z419 Encounter for procedure for purposes other than remedying health state, unspecified: Secondary | ICD-10-CM

## 2018-09-07 DIAGNOSIS — R251 Tremor, unspecified: Secondary | ICD-10-CM | POA: Insufficient documentation

## 2018-09-07 DIAGNOSIS — M1611 Unilateral primary osteoarthritis, right hip: Principal | ICD-10-CM | POA: Insufficient documentation

## 2018-09-07 HISTORY — PX: TOTAL HIP ARTHROPLASTY: SHX124

## 2018-09-07 SURGERY — ARTHROPLASTY, HIP, TOTAL, ANTERIOR APPROACH
Anesthesia: Spinal | Site: Hip | Laterality: Right

## 2018-09-07 MED ORDER — SODIUM CHLORIDE 0.9 % IR SOLN
Status: DC | PRN
  Administered 2018-09-07: 1000 mL

## 2018-09-07 MED ORDER — METHOCARBAMOL 500 MG IVPB - SIMPLE MED
500.0000 mg | Freq: Four times a day (QID) | INTRAVENOUS | Status: DC | PRN
  Administered 2018-09-07: 500 mg via INTRAVENOUS
  Filled 2018-09-07: qty 50

## 2018-09-07 MED ORDER — OXYCODONE HCL 5 MG PO TABS
10.0000 mg | ORAL_TABLET | ORAL | Status: DC | PRN
  Administered 2018-09-07 – 2018-09-09 (×8): 15 mg via ORAL
  Filled 2018-09-07 (×8): qty 3

## 2018-09-07 MED ORDER — EPHEDRINE SULFATE-NACL 50-0.9 MG/10ML-% IV SOSY
PREFILLED_SYRINGE | INTRAVENOUS | Status: DC | PRN
  Administered 2018-09-07: 5 mg via INTRAVENOUS
  Administered 2018-09-07: 20 mg via INTRAVENOUS

## 2018-09-07 MED ORDER — EPHEDRINE 5 MG/ML INJ
INTRAVENOUS | Status: AC
  Filled 2018-09-07: qty 10

## 2018-09-07 MED ORDER — DOCUSATE SODIUM 100 MG PO CAPS
100.0000 mg | ORAL_CAPSULE | Freq: Two times a day (BID) | ORAL | Status: DC
  Administered 2018-09-07 – 2018-09-09 (×4): 100 mg via ORAL
  Filled 2018-09-07 (×4): qty 1

## 2018-09-07 MED ORDER — STERILE WATER FOR IRRIGATION IR SOLN
Status: DC | PRN
  Administered 2018-09-07: 2000 mL

## 2018-09-07 MED ORDER — ONDANSETRON HCL 4 MG/2ML IJ SOLN: 4.0000 mg | Freq: Four times a day (QID) | INTRAMUSCULAR | Status: DC | PRN

## 2018-09-07 MED ORDER — PHENYLEPHRINE HCL 10 MG/ML IJ SOLN
INTRAMUSCULAR | Status: AC
  Filled 2018-09-07: qty 1

## 2018-09-07 MED ORDER — DIPHENHYDRAMINE HCL 50 MG/ML IJ SOLN
12.5000 mg | Freq: Once | INTRAMUSCULAR | Status: AC
  Administered 2018-09-07: 12.5 mg via INTRAVENOUS

## 2018-09-07 MED ORDER — PROPOFOL 10 MG/ML IV BOLUS
INTRAVENOUS | Status: AC
  Filled 2018-09-07: qty 20

## 2018-09-07 MED ORDER — TRANEXAMIC ACID-NACL 1000-0.7 MG/100ML-% IV SOLN
1000.0000 mg | INTRAVENOUS | Status: AC
  Administered 2018-09-07: 1000 mg via INTRAVENOUS
  Filled 2018-09-07: qty 100

## 2018-09-07 MED ORDER — CEFAZOLIN SODIUM-DEXTROSE 2-4 GM/100ML-% IV SOLN
2.0000 g | Freq: Four times a day (QID) | INTRAVENOUS | Status: AC
  Administered 2018-09-07 (×2): 2 g via INTRAVENOUS
  Filled 2018-09-07 (×2): qty 100

## 2018-09-07 MED ORDER — CHLORHEXIDINE GLUCONATE 4 % EX LIQD: 60.0000 mL | Freq: Once | CUTANEOUS | Status: DC

## 2018-09-07 MED ORDER — TAMSULOSIN HCL 0.4 MG PO CAPS
0.4000 mg | ORAL_CAPSULE | Freq: Once | ORAL | Status: AC
  Administered 2018-09-07: 0.4 mg via ORAL
  Filled 2018-09-07: qty 1

## 2018-09-07 MED ORDER — ONDANSETRON HCL 4 MG PO TABS
4.0000 mg | ORAL_TABLET | Freq: Four times a day (QID) | ORAL | Status: DC | PRN
  Filled 2018-09-07: qty 1

## 2018-09-07 MED ORDER — PROPRANOLOL HCL ER 60 MG PO CP24
60.0000 mg | ORAL_CAPSULE | Freq: Every day | ORAL | Status: DC
  Administered 2018-09-08 – 2018-09-09 (×2): 60 mg via ORAL
  Filled 2018-09-07 (×2): qty 1

## 2018-09-07 MED ORDER — HYDROMORPHONE HCL 1 MG/ML IJ SOLN
0.5000 mg | INTRAMUSCULAR | Status: DC | PRN
  Administered 2018-09-07 – 2018-09-08 (×7): 1 mg via INTRAVENOUS
  Filled 2018-09-07 (×7): qty 1

## 2018-09-07 MED ORDER — HYDROMORPHONE HCL 1 MG/ML IJ SOLN
INTRAMUSCULAR | Status: AC
  Filled 2018-09-07: qty 2

## 2018-09-07 MED ORDER — SODIUM CHLORIDE 0.9 % IV SOLN
INTRAVENOUS | Status: DC
  Administered 2018-09-07: 21:00:00 via INTRAVENOUS

## 2018-09-07 MED ORDER — OXYCODONE HCL 5 MG PO TABS
5.0000 mg | ORAL_TABLET | ORAL | Status: DC | PRN
  Administered 2018-09-08 – 2018-09-09 (×2): 10 mg via ORAL
  Filled 2018-09-07 (×2): qty 2

## 2018-09-07 MED ORDER — METOCLOPRAMIDE HCL 5 MG PO TABS: 5.0000 mg | ORAL_TABLET | Freq: Three times a day (TID) | ORAL | Status: DC | PRN

## 2018-09-07 MED ORDER — DIPHENHYDRAMINE HCL 50 MG/ML IJ SOLN
INTRAMUSCULAR | Status: AC
  Filled 2018-09-07: qty 1

## 2018-09-07 MED ORDER — DIPHENHYDRAMINE HCL 12.5 MG/5ML PO ELIX
12.5000 mg | ORAL_SOLUTION | ORAL | Status: DC | PRN
  Administered 2018-09-07: 25 mg via ORAL
  Filled 2018-09-07: qty 10

## 2018-09-07 MED ORDER — PHENYLEPHRINE 40 MCG/ML (10ML) SYRINGE FOR IV PUSH (FOR BLOOD PRESSURE SUPPORT)
PREFILLED_SYRINGE | INTRAVENOUS | Status: DC | PRN
  Administered 2018-09-07: 40 ug via INTRAVENOUS
  Administered 2018-09-07 (×2): 120 ug via INTRAVENOUS

## 2018-09-07 MED ORDER — ACETAMINOPHEN 325 MG PO TABS
325.0000 mg | ORAL_TABLET | Freq: Four times a day (QID) | ORAL | Status: DC | PRN
  Administered 2018-09-09: 650 mg via ORAL
  Filled 2018-09-07 (×2): qty 2

## 2018-09-07 MED ORDER — ASPIRIN 81 MG PO CHEW
81.0000 mg | CHEWABLE_TABLET | Freq: Two times a day (BID) | ORAL | Status: DC
  Administered 2018-09-07 – 2018-09-09 (×4): 81 mg via ORAL
  Filled 2018-09-07 (×4): qty 1

## 2018-09-07 MED ORDER — MENTHOL 3 MG MT LOZG: 1.0000 | LOZENGE | OROMUCOSAL | Status: DC | PRN

## 2018-09-07 MED ORDER — HYDROMORPHONE HCL 1 MG/ML IJ SOLN
0.2500 mg | INTRAMUSCULAR | Status: DC | PRN
  Administered 2018-09-07 (×4): 0.5 mg via INTRAVENOUS

## 2018-09-07 MED ORDER — PROPOFOL 10 MG/ML IV BOLUS
INTRAVENOUS | Status: AC
  Filled 2018-09-07: qty 60

## 2018-09-07 MED ORDER — PHENOL 1.4 % MT LIQD: 1.0000 | OROMUCOSAL | Status: DC | PRN

## 2018-09-07 MED ORDER — GABAPENTIN 400 MG PO CAPS
1200.0000 mg | ORAL_CAPSULE | Freq: Three times a day (TID) | ORAL | Status: DC
  Administered 2018-09-07 – 2018-09-09 (×6): 1200 mg via ORAL
  Filled 2018-09-07 (×7): qty 3

## 2018-09-07 MED ORDER — PROPOFOL 500 MG/50ML IV EMUL
INTRAVENOUS | Status: DC | PRN
  Administered 2018-09-07: 100 ug/kg/min via INTRAVENOUS

## 2018-09-07 MED ORDER — ONDANSETRON HCL 4 MG/2ML IJ SOLN
4.0000 mg | Freq: Once | INTRAMUSCULAR | Status: AC | PRN
  Administered 2018-09-07: 4 mg via INTRAVENOUS

## 2018-09-07 MED ORDER — PHENYLEPHRINE 40 MCG/ML (10ML) SYRINGE FOR IV PUSH (FOR BLOOD PRESSURE SUPPORT)
PREFILLED_SYRINGE | INTRAVENOUS | Status: AC
  Filled 2018-09-07: qty 10

## 2018-09-07 MED ORDER — LACTATED RINGERS IV SOLN: INTRAVENOUS | Status: DC

## 2018-09-07 MED ORDER — ONDANSETRON HCL 4 MG/2ML IJ SOLN
INTRAMUSCULAR | Status: AC
  Filled 2018-09-07: qty 2

## 2018-09-07 MED ORDER — PANTOPRAZOLE SODIUM 40 MG PO TBEC
40.0000 mg | DELAYED_RELEASE_TABLET | Freq: Every day | ORAL | Status: DC
  Administered 2018-09-07 – 2018-09-09 (×3): 40 mg via ORAL
  Filled 2018-09-07 (×3): qty 1

## 2018-09-07 MED ORDER — ONDANSETRON HCL 4 MG/2ML IJ SOLN
INTRAMUSCULAR | Status: DC | PRN
  Administered 2018-09-07: 4 mg via INTRAVENOUS

## 2018-09-07 MED ORDER — METHOCARBAMOL 500 MG IVPB - SIMPLE MED
INTRAVENOUS | Status: AC
  Filled 2018-09-07: qty 50

## 2018-09-07 MED ORDER — METHOCARBAMOL 500 MG PO TABS
500.0000 mg | ORAL_TABLET | Freq: Four times a day (QID) | ORAL | Status: DC | PRN
  Administered 2018-09-07 – 2018-09-09 (×6): 500 mg via ORAL
  Filled 2018-09-07 (×7): qty 1

## 2018-09-07 MED ORDER — LACTATED RINGERS IV SOLN
INTRAVENOUS | Status: DC
  Administered 2018-09-07 (×2): via INTRAVENOUS

## 2018-09-07 MED ORDER — MEPERIDINE HCL 50 MG/ML IJ SOLN
6.2500 mg | INTRAMUSCULAR | Status: DC | PRN
  Administered 2018-09-07: 6.25 mg via INTRAVENOUS

## 2018-09-07 MED ORDER — BUPIVACAINE IN DEXTROSE 0.75-8.25 % IT SOLN
INTRATHECAL | Status: DC | PRN
  Administered 2018-09-07: 2 mL via INTRATHECAL

## 2018-09-07 MED ORDER — METOCLOPRAMIDE HCL 5 MG/ML IJ SOLN
5.0000 mg | Freq: Three times a day (TID) | INTRAMUSCULAR | Status: DC | PRN
  Administered 2018-09-07: 10 mg via INTRAVENOUS
  Filled 2018-09-07: qty 2

## 2018-09-07 MED ORDER — DULOXETINE HCL 60 MG PO CPEP
60.0000 mg | ORAL_CAPSULE | Freq: Every day | ORAL | Status: DC
  Administered 2018-09-08 – 2018-09-09 (×2): 60 mg via ORAL
  Filled 2018-09-07 (×3): qty 1

## 2018-09-07 MED ORDER — POLYETHYLENE GLYCOL 3350 17 G PO PACK: 17.0000 g | PACK | Freq: Every day | ORAL | Status: DC | PRN

## 2018-09-07 MED ORDER — ALUM & MAG HYDROXIDE-SIMETH 200-200-20 MG/5ML PO SUSP: 30.0000 mL | ORAL | Status: DC | PRN

## 2018-09-07 MED ORDER — SODIUM CHLORIDE 0.9 % IV SOLN
INTRAVENOUS | Status: DC | PRN
  Administered 2018-09-07: 50 ug/min via INTRAVENOUS

## 2018-09-07 MED ORDER — MEPERIDINE HCL 50 MG/ML IJ SOLN
INTRAMUSCULAR | Status: AC
  Filled 2018-09-07: qty 1

## 2018-09-07 MED ORDER — SODIUM CHLORIDE 0.9 % IV SOLN: 30.0000 ug/min | INTRAVENOUS | Status: DC

## 2018-09-07 SURGICAL SUPPLY — 48 items
BAG SPEC THK2 15X12 ZIP CLS (MISCELLANEOUS) ×1
BAG ZIPLOCK 12X15 (MISCELLANEOUS) ×1 IMPLANT
BLADE SAW SGTL 18X1.27X75 (BLADE) ×2 IMPLANT
BLADE SURG SZ10 CARB STEEL (BLADE) ×4 IMPLANT
CHLORAPREP W/TINT 26ML (MISCELLANEOUS) ×1 IMPLANT
COVER PERINEAL POST (MISCELLANEOUS) ×2 IMPLANT
COVER SURGICAL LIGHT HANDLE (MISCELLANEOUS) ×2 IMPLANT
CUP ACET PNNCL SECTR W/GRIP 56 (Hips) IMPLANT
DRAPE EENT ADH APERT 31X51 STR (DRAPES) ×1 IMPLANT
DRAPE INCISE 23X17 IOBAN STRL (DRAPES) ×1
DRAPE INCISE 23X17 STRL (DRAPES) IMPLANT
DRAPE INCISE IOBAN 23X17 STRL (DRAPES) ×1 IMPLANT
DRAPE U-SHAPE 47X51 STRL (DRAPES) ×4 IMPLANT
DRESSING AQUACEL AG SP 3.5X10 (GAUZE/BANDAGES/DRESSINGS) IMPLANT
DRSG AQUACEL AG ADV 3.5X10 (GAUZE/BANDAGES/DRESSINGS) ×2 IMPLANT
DRSG AQUACEL AG SP 3.5X10 (GAUZE/BANDAGES/DRESSINGS) ×2
ELECT REM PT RETURN 15FT ADLT (MISCELLANEOUS) ×2 IMPLANT
GAUZE XEROFORM 1X8 LF (GAUZE/BANDAGES/DRESSINGS) ×1 IMPLANT
GLOVE BIO SURGEON STRL SZ 6.5 (GLOVE) ×1 IMPLANT
GLOVE BIO SURGEON STRL SZ7.5 (GLOVE) ×2 IMPLANT
GLOVE BIOGEL PI IND STRL 6.5 (GLOVE) IMPLANT
GLOVE BIOGEL PI IND STRL 7.0 (GLOVE) IMPLANT
GLOVE BIOGEL PI IND STRL 7.5 (GLOVE) IMPLANT
GLOVE BIOGEL PI IND STRL 8 (GLOVE) ×2 IMPLANT
GLOVE BIOGEL PI INDICATOR 6.5 (GLOVE) ×1
GLOVE BIOGEL PI INDICATOR 7.0 (GLOVE) ×2
GLOVE BIOGEL PI INDICATOR 7.5 (GLOVE) ×2
GLOVE BIOGEL PI INDICATOR 8 (GLOVE) ×2
GLOVE ECLIPSE 8.0 STRL XLNG CF (GLOVE) ×2 IMPLANT
GOWN STRL REUS W/TWL LRG LVL3 (GOWN DISPOSABLE) ×1 IMPLANT
GOWN STRL REUS W/TWL XL LVL3 (GOWN DISPOSABLE) ×5 IMPLANT
HANDPIECE INTERPULSE COAX TIP (DISPOSABLE) ×2
HEAD M SROM 36MM PLUS 1.5 (Hips) IMPLANT
HOLDER FOLEY CATH W/STRAP (MISCELLANEOUS) ×2 IMPLANT
PACK ANTERIOR HIP CUSTOM (KITS) ×2 IMPLANT
PINN SECTOR W/GRIP ACE CUP 56 (Hips) ×2 IMPLANT
PINNACLE ALTRX PLUS 4 N 36X56 (Hips) ×1 IMPLANT
SET HNDPC FAN SPRY TIP SCT (DISPOSABLE) ×1 IMPLANT
SROM M HEAD 36MM PLUS 1.5 (Hips) ×2 IMPLANT
STAPLER VISISTAT 35W (STAPLE) ×1 IMPLANT
STEM FEMORAL SZ6 HIGH ACTIS (Stem) ×1 IMPLANT
SUT ETHIBOND NAB CT1 #1 30IN (SUTURE) ×2 IMPLANT
SUT VIC AB 0 CT1 36 (SUTURE) ×2 IMPLANT
SUT VIC AB 1 CT1 36 (SUTURE) ×2 IMPLANT
SUT VIC AB 2-0 CT1 27 (SUTURE) ×4
SUT VIC AB 2-0 CT1 TAPERPNT 27 (SUTURE) ×2 IMPLANT
TRAY FOLEY MTR SLVR 16FR STAT (SET/KITS/TRAYS/PACK) ×2 IMPLANT
YANKAUER SUCT BULB TIP 10FT TU (MISCELLANEOUS) ×2 IMPLANT

## 2018-09-07 NOTE — Brief Op Note (Signed)
09/07/2018  11:55 AM  PATIENT:  Blake [name]  48 y.o. male  PRE-OPERATIVE DIAGNOSIS:  osteoarthritis right hip  POST-OPERATIVE DIAGNOSIS:  osteoarthritis right hip  PROCEDURE:  Procedure(s): RIGHT TOTAL HIP ARTHROPLASTY ANTERIOR APPROACH (Right)  SURGEON:  Surgeon(s) and Role:    Mcarthur Rossetti, MD - Primary  PHYSICIAN ASSISTANT:  Benita Stabile, PA-C  ANESTHESIA:   spinal  EBL:  400 mL   COUNTS:  YES  DICTATION: .Other Dictation: Dictation Number [id number]  PLAN OF CARE: Admit to inpatient   PATIENT DISPOSITION:  PACU - hemodynamically stable.   Delay start of Pharmacological VTE agent (>24hrs) due to surgical blood loss or risk of bleeding: no

## 2018-09-07 NOTE — Anesthesia Preprocedure Evaluation (Signed)
Anesthesia Evaluation  Patient identified by MRN, date of birth, ID band Patient awake    Reviewed: Allergy & Precautions, NPO status , Patient's Chart, lab work & pertinent test results  Airway Mallampati: II  TM Distance: >3 FB Neck ROM: Full    Dental   Pulmonary Current Smoker,    Pulmonary exam normal        Cardiovascular Normal cardiovascular exam     Neuro/Psych    GI/Hepatic   Endo/Other    Renal/GU      Musculoskeletal   Abdominal   Peds  Hematology   Anesthesia Other Findings   Reproductive/Obstetrics                             Anesthesia Physical Anesthesia Plan  ASA: II  Anesthesia Plan: Spinal   Post-op Pain Management:    Induction: Intravenous  PONV Risk Score and Plan:   Airway Management Planned: Simple Face Mask  Additional Equipment:   Intra-op Plan:   Post-operative Plan:   Informed Consent: I have reviewed the patients History and Physical, chart, labs and discussed the procedure including the risks, benefits and alternatives for the proposed anesthesia with the patient or authorized representative who has indicated his/her understanding and acceptance.       Plan Discussed with: CRNA and Surgeon  Anesthesia Plan Comments:         Anesthesia Quick Evaluation

## 2018-09-07 NOTE — Progress Notes (Signed)
Physical Therapy Treatment Patient Details Name: Blake [name] MRN: [id number] DOB: August 09, 1970 Today's Date: 09/07/2018    History of Present Illness Pt s/p R THR     PT Comments    Pt s/p R THR and presents with decreased R LE strength/ROM, post op pain and obesity limiting functional mobility.  Pt should progress to dc home with family assist.   Follow Up Recommendations  Follow surgeon's recommendation for DC plan and follow-up therapies     Equipment Recommendations  Rolling walker with 5" wheels(Wide RW please - pt is ~400lbs)    Recommendations for Other Services       Precautions / Restrictions Precautions Precautions: Fall Restrictions Weight Bearing Restrictions: No Other Position/Activity Restrictions: WBAT    Mobility  Bed Mobility Overal bed mobility: Needs Assistance Bed Mobility: Supine to Sit     Supine to sit: Min assist     General bed mobility comments: cues for sequence and use of L LE to self assist; pt utilizing bed rails to self assist  Transfers Overall transfer level: Needs assistance Equipment used: Rolling walker (2 wheeled) Transfers: Sit to/from Stand Sit to Stand: From elevated surface;+2 safety/equipment;Min assist         General transfer comment: cues for LE management and use of UEs to self assist  Ambulation/Gait Ambulation/Gait assistance: Min assist;+2 physical assistance;+2 safety/equipment Gait Distance (Feet): 5 Feet Assistive device: Rolling walker (2 wheeled) Gait Pattern/deviations: Step-to pattern;Decreased step length - right;Decreased step length - left;Shuffle;Trunk flexed Gait velocity: decr   General Gait Details: cues for sequence, posture and position from [employer]             Wheelchair Mobility    Modified Rankin (Stroke Patients Only)       Balance Overall balance assessment: Mild deficits observed, not formally tested                                           Cognition Arousal/Alertness: Awake/alert Behavior During Therapy: WFL for tasks assessed/performed Overall Cognitive Status: Within Functional Limits for tasks assessed                                        Exercises Total Joint Exercises Ankle Circles/Pumps: AROM;Both;15 reps;Supine    General Comments        Pertinent Vitals/Pain Pain Assessment: 0-10 Pain Score: 7  Pain Location: R hip and back Pain Descriptors / Indicators: Aching;Burning;Sore Pain Intervention(s): Limited activity within patient's tolerance;Monitored during session;Premedicated before session;Ice applied    Home Living Family/patient expects to be discharged to:: Private residence Living Arrangements: Alone Available Help at Discharge: Family;Available 24 hours/day Type of Home: House Home Access: Stairs to enter Entrance Stairs-Rails: Right;Left;Can reach both Home Layout: One level Home Equipment: None      Prior Function Level of Independence: Independent          PT Goals (current goals can now be found in the care plan section) Acute Rehab PT Goals Patient Stated Goal: Regain IND PT Goal Formulation: With patient Time For Goal Achievement: 09/14/18 Potential to Achieve Goals: Good    Frequency    7X/week      PT Plan      Co-evaluation  AM-PAC PT "6 Clicks" Mobility   Outcome Measure  Help needed turning from your back to your side while in a flat bed without using bedrails?: A Lot Help needed moving from lying on your back to sitting on the side of a flat bed without using bedrails?: A Little Help needed moving to and from a bed to a chair (including a wheelchair)?: A Little Help needed standing up from a chair using your arms (e.g., wheelchair or bedside chair)?: A Little Help needed to walk in hospital room?: A Little Help needed climbing 3-5 steps with a railing? : A Lot 6 Click Score: 16    End of Session Equipment Utilized During  Treatment: Gait belt Activity Tolerance: Patient tolerated treatment well Patient left: in chair;with call bell/phone within reach;with family/visitor present Nurse Communication: Mobility status PT Visit Diagnosis: Difficulty in walking, not elsewhere classified (R26.2)     Time: 1749-4496 PT Time Calculation (min) (ACUTE ONLY): 22 min  Charges:                        Debe Coder PT [place] Pager [phone] Office [phone]    [name] 09/07/2018, 5:10 PM

## 2018-09-07 NOTE — Op Note (Signed)
NAMEDERIN, GRANQUIST MEDICAL RECORD [id number] ACCOUNT [id number] DATE OF BIRTH:08-07-1970 FACILITY: [facility] LOCATION: [facility] PHYSICIAN:Rashaun Curl [place], MD  OPERATIVE REPORT  DATE OF PROCEDURE:  09/07/2018  PREOPERATIVE DIAGNOSES: 1.  Primary osteoarthritis and degenerative joint disease, right hip. 2.  Severe morbid obesity with a body mass index of over 40.  POSTOPERATIVE DIAGNOSES:   1.  Primary osteoarthritis and degenerative joint disease, right hip. 2.  Severe morbid obesity with a body mass index of over 40.  PROCEDURE:  Right total hip arthroplasty, direct anterior approach.  IMPLANTS:  DePuy Sector Gription acetabular component size 56, size 36+4 polyethylene liner, size 6 high offset Actis femoral component, size 36+1.5 metal hip ball.  SURGEON:  Lind Guest. Ninfa Linden, MD  ASSISTANT:  Erskine Emery, PA-C.  ANESTHESIA:  Spinal.  ANTIBIOTICS:  3 g IV Ancef.  ESTIMATED BLOOD LOSS:  235 mL  COMPLICATIONS:  None.  INDICATIONS:  The patient is a very pleasant 48 year old morbidly obese and tall individual with debilitating arthritis involving his right hip.  He has done everything he can to lose weight and has lost actually a considerable amount of weight.  He is  headed in the right direction.  His x-rays of his hip shows severe end-stage arthritis of the right hip with complete loss of joint space.  His pain is daily and it has detrimentally affected his mobility his quality of life and his activities of daily  living to the point he does wish to proceed with total hip arthroplasty.  After examining in the office in saying that he is headed in the right direction with weight loss standpoint, we felt comfortable with proceeding with this surgery after laying him  in the supine position in the office to assess if we could get to his hip.  We had a long and thorough discussion about the risks and benefits of surgery.  He knows that given his morbid obesity he has a  heightened risk of acute blood loss anemia, nerve  or vessel injury, infection, dislocation, fracture, DVT and implant failure.  Again, all these are heightened given his weight.  He understands our goals are to decrease pain, improve mobility and overall improve quality of life.  DESCRIPTION OF PROCEDURE:  After informed consent was obtained and appropriate right hip was marked, he was brought to the operating room, sat up on a stretcher where spinal anesthesia was then obtained.  He was then laid in supine position on a  stretcher.  Foley catheter was placed and traction boots were placed on both his feet.  Next, he was placed supine on the Hana fracture table, the perineal post in place and both legs in line skeletal traction device and no traction applied.  His right  operative hip was prepped and draped with ChloraPrep and sterile drapes.  A time-out was called to identify correct patient, correct right hip.  We then made an incision just inferior and posterior to the anterior superior iliac spine and carried this  obliquely down the leg.  dissected down tensor fascia lata muscle.  Tensor fascia was then divided longitudinally to proceed with direct anterior approach to the hip.  We identified and cauterized circumflex vessels and identified the hip capsule, opened  the hip capsule in an L-type format, finding moderate joint effusion and significant arthritis around his right hip.  This was certainly a difficult dissection due to a size.  We then cleaned remnants of the acetabular labrum and other debris from the  acetabulum  and began reaming under direct visualization from a size 44 reamer in stepwise increments up to a size 55 with all reamers under direct visualization, the last reamer under direct fluoroscopy, so we could obtain our depth of reaming, our  inclination and anteversion.  I then placed the real DePuy Sector Gription acetabular component size 56 with a 36+4 polyethylene liner for that  size acetabular component.  Attention was then turned to the femur.  With the leg externally rotated to 120  degrees, extended and adducted, we were able to place a Mueller retractor medially and Hohmann retractor around the greater trochanter, released lateral joint capsule and used a box-cutting osteotome and enter femoral canal and a rongeur to lateralize  then began broaching from a size zero broach, going up to a size 6.  With all broaches placed under direct visualization, the last broach also under direct fluoroscopy.  We then placed a high offset trial femoral neck and a 36+1.5 hip ball.  We rolled  the leg back over and up and with traction and internal rotation, reducing the pelvis and we felt that we had reapproximated with leg length, offset, range of motion and stability on exam under direct visualization and fluoroscopy.  We then dislocated  the hip and removed the trial components.  We placed the real high offset Actis stem size 6 with high offset and the real 36+1.5 metal hip ball, reduced this in the acetabulum and again it was stable.  We then irrigated the soft tissue with normal saline  solution using pulsatile lavage.  We did not close the joint capsule and closed the tensor fascia with a running #1 Vicryl suture followed by 0 Vicryl around the deep tissue, 2-0 Vicryl subcutaneous tissue and interrupted staples on the skin.  Xeroform  and Aquacel dressing was applied.  He was taken off of the Hana table and taken to recovery room in stable condition.  All final counts were correct.  No complications noted.  Of note, Benita Stabile, PA-C, assisted in the entire case.  His assistance was  crucial for facilitating all aspects of this case.  TN/NUANCE  D:09/07/2018 T:09/07/2018 JOB:[id number]

## 2018-09-07 NOTE — Transfer of Care (Signed)
Immediate Anesthesia Transfer of Care Note  Patient: Blake [name]  Procedure(s) Performed: Procedure(s): RIGHT TOTAL HIP ARTHROPLASTY ANTERIOR APPROACH (Right)  Patient Location: PACU  Anesthesia Type:Spinal  Level of Consciousness:  sedated, patient cooperative and responds to stimulation  Airway & Oxygen Therapy:Patient Spontanous Breathing and Patient connected to face mask oxgen  Post-op Assessment:  Report given to PACU RN and Post -op Vital signs reviewed and stable  Post vital signs:  Reviewed and stable  Last Vitals:  Vitals:   09/07/18 0805  BP: (!) 145/100  Pulse: 84  Resp: 18  Temp: 36.7 C  SpO2: 79%    Complications: No apparent anesthesia complications

## 2018-09-07 NOTE — H&P (Signed)
TOTAL HIP ADMISSION H&P  Patient is admitted for right total hip arthroplasty.  Subjective:  Chief Complaint: right hip pain  HPI: [name], 48 y.o. male, has a history of pain and functional disability in the right hip(s) due to arthritis and patient has failed non-surgical conservative treatments for greater than 12 weeks to include NSAID's and/or analgesics, corticosteriod injections, use of assistive devices, weight reduction as appropriate and activity modification.  Onset of symptoms was gradual starting 4 years ago with gradually worsening course since that time.The patient noted no past surgery on the right hip(s).  Patient currently rates pain in the right hip at 10 out of 10 with activity. Patient has night pain, worsening of pain with activity and weight bearing, trendelenberg gait, pain that interfers with activities of daily living, pain with passive range of motion and crepitus. Patient has evidence of subchondral cysts, subchondral sclerosis, periarticular osteophytes and joint space narrowing by imaging studies. This condition presents safety issues increasing the risk of falls.  There is no current active infection.  Patient Active Problem List   Diagnosis Date Noted  . Unilateral primary osteoarthritis, right hip 08/13/2018  . Severe obesity (BMI >= 40) ([place]) 08/13/2018  . Chronic bilateral low back pain with bilateral sciatica 04/02/2018  . Epidural lipomatosis 04/02/2018  . Right hip pain 04/02/2018  . Preop cardiovascular exam 03/30/2018  . Dilated aortic root ([place])   . Chest pain 07/27/2017  . Palpitations 07/26/2017  . ERECTILE DYSFUNCTION, MILD 11/12/2007  . Tobacco abuse 11/12/2007   Past Medical History:  Diagnosis Date  . Arthritis   . Back pain   . Dilated aortic root ([facility])    39mm ascending aorta by echo 09/2017, see 08-2018 echo in epic for newer results   . ERECTILE DYSFUNCTION, MILD 11/12/2007   Qualifier: Diagnosis of  By: Carolyne Littles    . History of  blood transfusion    1997, received 15 units of blood   . Morbid obesity ([place])   . PVC's (premature ventricular contractions)   . TOBACCO ABUSE 11/12/2007   Qualifier: Diagnosis of  By: Carmie End MD, Junie Panning    . Tremor    head and hands , controlled on cymbalta    Past Surgical History:  Procedure Laterality Date  . FACIAL FRACTURE SURGERY  09/1995   left side of face crush injury, carnial fracture     Current Facility-Administered Medications  Medication Dose Route Frequency Provider Last Rate Last Dose  . ceFAZolin (ANCEF) 3 g in dextrose 5 % 50 mL IVPB  3 g Intravenous On Call to [place], MD       Current Outpatient Medications  Medication Sig Dispense Refill Last Dose  . acetaminophen-codeine (TYLENOL #3) 300-30 MG tablet Take 1 tablet by mouth every 8 (eight) hours as needed for moderate pain. 42 tablet 0 Taking  . DULoxetine (CYMBALTA) 60 MG capsule Take 1 capsule (60 mg total) by mouth daily. 30 capsule 11 Taking  . gabapentin (NEURONTIN) 600 MG tablet Take 2 tablets (1,200 mg total) by mouth 3 (three) times daily. 180 tablet 3 Taking  . meloxicam (MOBIC) 15 MG tablet Take 1 tablet (15 mg total) by mouth daily. 30 tablet 0 Taking  . propranolol ER (INDERAL LA) 60 MG 24 hr capsule Take 1 capsule (60 mg total) by mouth daily. 30 capsule 11 Taking  . varenicline (CHANTIX CONTINUING MONTH PAK) 1 MG tablet Take 1 tablet (1 mg total) by mouth 2 (two) times daily.  30 tablet 2 Taking   No Known Allergies  Social History   Tobacco Use  . Smoking status: Current Every Day Smoker    Packs/day: 0.25    Years: 30.00    Pack years: 7.50  . Smokeless tobacco: Never Used  . Tobacco comment: down to 3-4 cigarettes a day   Substance Use Topics  . Alcohol use: Yes    Comment: Drink on weekends    Family History  Problem Relation Age of Onset  . COPD Mother   . Leukemia Father   . Hypertension Sister   . Diabetes Sister      Review of Systems  Musculoskeletal: Positive  for joint pain.  All other systems reviewed and are negative.   Objective:  Physical Exam  Constitutional: He is oriented to person, place, and time. He appears well-developed and well-nourished.  HENT:  Head: Normocephalic and atraumatic.  Eyes: Pupils are equal, round, and reactive to light. EOM are normal.  Neck: Normal range of motion.  Cardiovascular: Normal rate.  Respiratory: Effort normal.  GI: Soft.  Musculoskeletal:     Right hip: He exhibits decreased range of motion, decreased strength, tenderness and bony tenderness.  Neurological: He is alert and oriented to person, place, and time.  Skin: Skin is warm and dry.  Psychiatric: He has a normal mood and affect.    Vital signs in last 24 hours:    Labs:   Estimated body mass index is 49.5 kg/m as calculated from the following:   Height as of 09/05/18: 6' (1.829 m).   Weight as of 09/05/18: 165.6 kg.   Imaging Review Plain radiographs demonstrate severe degenerative joint disease of the right hip(s). The bone quality appears to be good for age and reported activity level.      Assessment/Plan:  End stage arthritis, right hip(s)  The patient history, physical examination, clinical judgement of the provider and imaging studies are consistent with end stage degenerative joint disease of the right hip(s) and total hip arthroplasty is deemed medically necessary. The treatment options including medical management, injection therapy, arthroscopy and arthroplasty were discussed at length. The risks and benefits of total hip arthroplasty were presented and reviewed. The risks due to aseptic loosening, infection, stiffness, dislocation/subluxation,  thromboembolic complications and other imponderables were discussed.  The patient acknowledged the explanation, agreed to proceed with the plan and consent was signed. Patient is being admitted for inpatient treatment for surgery, pain control, PT, OT, prophylactic antibiotics,  VTE prophylaxis, progressive ambulation and ADL's and discharge planning.The patient is planning to be discharged home with home health services    Patient's anticipated LOS is less than 2 midnights, meeting these requirements: - Younger than 41 - Lives within 1 hour of care - Has a competent adult at home to recover with post-op recover - NO history of  - Chronic pain requiring opiods  - Diabetes  - Coronary Artery Disease  - Heart failure  - Heart attack  - Stroke  - DVT/VTE  - Cardiac arrhythmia  - Respiratory Failure/COPD  - Renal failure  - Anemia  - Advanced Liver disease

## 2018-09-07 NOTE — Anesthesia Postprocedure Evaluation (Signed)
Anesthesia Post Note  Patient: Blake [name]  Procedure(s) Performed: RIGHT TOTAL HIP ARTHROPLASTY ANTERIOR APPROACH (Right Hip)     Patient location during evaluation: PACU Anesthesia Type: Spinal Level of consciousness: oriented and awake and alert Pain management: pain level controlled Vital Signs Assessment: post-procedure vital signs reviewed and stable Respiratory status: spontaneous breathing, respiratory function stable and patient connected to nasal cannula oxygen Cardiovascular status: blood pressure returned to baseline and stable Postop Assessment: no headache, no backache and no apparent nausea or vomiting Anesthetic complications: no    Last Vitals:  Vitals:   09/07/18 1545 09/07/18 1637  BP: (!) 143/89 (!) 143/95  Pulse: 83 93  Resp: 16 16  Temp: 36.7 C (!) 36.4 C  SpO2: 91% 100%    Last Pain:  Vitals:   09/07/18 1647  TempSrc:   PainSc: 7                  Keani Gotcher DAVID

## 2018-09-08 LAB — CBC
HCT: 42.3 % (ref 39.0–52.0)
Hemoglobin: 13.1 g/dL (ref 13.0–17.0)
MCH: 31.1 pg (ref 26.0–34.0)
MCHC: 31 g/dL (ref 30.0–36.0)
MCV: 100.5 fL — ABNORMAL HIGH (ref 80.0–100.0)
Platelets: 174 10*3/uL (ref 150–400)
RBC: 4.21 MIL/uL — ABNORMAL LOW (ref 4.22–5.81)
RDW: 14.7 % (ref 11.5–15.5)
WBC: 9.2 10*3/uL (ref 4.0–10.5)
nRBC: 0 % (ref 0.0–0.2)

## 2018-09-08 LAB — BASIC METABOLIC PANEL
Anion gap: 8 (ref 5–15)
BUN: 9 mg/dL (ref 6–20)
CO2: 24 mmol/L (ref 22–32)
Calcium: 8.2 mg/dL — ABNORMAL LOW (ref 8.9–10.3)
Chloride: 101 mmol/L (ref 98–111)
Creatinine, Ser: 1.09 mg/dL (ref 0.61–1.24)
GFR calc Af Amer: >60 mL/min (ref >60)
GFR calc non Af Amer: >60 mL/min (ref >60)
Glucose, Bld: 116 mg/dL — ABNORMAL HIGH (ref 70–99)
Potassium: 4.1 mmol/L (ref 3.5–5.1)
Sodium: 133 mmol/L — ABNORMAL LOW (ref 135–145)

## 2018-09-08 MED ORDER — ASPIRIN 81 MG PO CHEW: 81.0000 mg | CHEWABLE_TABLET | Freq: Two times a day (BID) | ORAL | 0 refills | Status: DC

## 2018-09-08 MED ORDER — METHOCARBAMOL 500 MG PO TABS: 500.0000 mg | ORAL_TABLET | Freq: Four times a day (QID) | ORAL | 1 refills | Status: DC | PRN

## 2018-09-08 MED ORDER — OXYCODONE HCL 5 MG PO TABS: 5.0000 mg | ORAL_TABLET | ORAL | 0 refills | Status: DC | PRN

## 2018-09-08 NOTE — Discharge Instructions (Signed)

## 2018-09-08 NOTE — Progress Notes (Signed)
Patient was in bed and restless, repositioning self at time of vitals.

## 2018-09-08 NOTE — Evaluation (Signed)
Occupational Therapy Evaluation Patient Details Name: Blake [name] MRN: [id number] DOB: Jul 31, 1970 Today's Date: 09/08/2018    History of Present Illness Pt s/p R THR    Clinical Impression   Pt admitted with above diagnoses, with pain and body habitus limiting ability to complete BADL at desired level of ind. PTA, pt ind with BADL- had not been working on disability for a few years and living alone. He shares family members will be able to intermittently assist at home after surgery. Pt sharing dressing becoming increasingly difficult. Offered LB ADL instruction, recommend pt acquire equipment to improve ind in dressing. Pt completed functional mobility to BR with min guard A, +2 for equipment management. BSC over toilet t/f completed at min guard A. Pt stood to complete grooming tasks, leaning on sink 2/2 pain. At this time, recommend pt receive HHOT to increase ind and safety with BADL, pending progress and pt comfort. Will continue to follow acutely per POC.    Follow Up Recommendations  Home health OT    Equipment Recommendations  3 in 1 bedside commode;Other (comment)(LB ADL equip)    Recommendations for Other Services       Precautions / Restrictions Precautions Precautions: Fall Restrictions Weight Bearing Restrictions: No Other Position/Activity Restrictions: WBAT      Mobility Bed Mobility Overal bed mobility: Needs Assistance Bed Mobility: Supine to Sit     Supine to sit: Min assist     General bed mobility comments: cues for sequencing, reliance on UEs to pull to sitting  Transfers Overall transfer level: Needs assistance Equipment used: Rolling walker (2 wheeled) Transfers: Sit to/from Stand Sit to Stand: Min assist;Min guard;+2 safety/equipment         General transfer comment: cueing for hand placement    Balance Overall balance assessment: Mild deficits observed, not formally tested                                         ADL  either performed or assessed with clinical judgement   ADL Overall ADL's : Needs assistance/impaired Eating/Feeding: Independent;Sitting   Grooming: Standing;Oral care;Wash/dry face;Min guard Grooming Details (indicate cue type and reason): needing to lean on sink for pain relief Upper Body Bathing: Modified independent;Sitting   Lower Body Bathing: Moderate assistance;Sit to/from stand;Sitting/lateral leans;Cueing for compensatory techniques;With adaptive equipment   Upper Body Dressing : Modified independent   Lower Body Dressing: Minimal assistance;Sit to/from stand;Sitting/lateral leans;With adaptive equipment Lower Body Dressing Details (indicate cue type and reason): with sock aide/reacher instruction Toilet Transfer: Min Forensic psychologist Details (indicate cue type and reason): BSC over toilet Toileting- Clothing Manipulation and Hygiene: Modified independent   Tub/ Shower Transfer: Min guard;Shower seat;3 in [employer] walker   Functional mobility during ADLs: Min guard;Rolling walker General ADL Comments: pt with increased pain this date. Practiced BSC t/f, introduced LB equip. Needing wider equipment, reliance due to body habitus     Vision Patient Visual Report: No change from baseline       Perception     Praxis      Pertinent Vitals/Pain Pain Assessment: 0-10 Pain Score: 7  Pain Location: R hip Pain Descriptors / Indicators: Aching;Sore Pain Intervention(s): Limited activity within patient's tolerance;Monitored during session;Repositioned;Premedicated before session;Ice applied     Hand Dominance     Extremity/Trunk Assessment Upper Extremity Assessment Upper Extremity Assessment: Overall WFL for tasks assessed   Lower Extremity Assessment  Lower Extremity Assessment: Defer to PT evaluation   Cervical / Trunk Assessment Cervical / Trunk Assessment: Normal   Communication Communication Communication: No difficulties   Cognition  Arousal/Alertness: Awake/alert Behavior During Therapy: WFL for tasks assessed/performed Overall Cognitive Status: Within Functional Limits for tasks assessed                                     General Comments       Exercises     Shoulder Instructions      Home Living Family/patient expects to be discharged to:: Private residence Living Arrangements: Alone Available Help at Discharge: Family;Available 24 hours/day Type of Home: House Home Access: Stairs to enter [employer] of Steps: 4 Entrance Stairs-Rails: Can reach both Home Layout: One level     Bathroom Shower/Tub: Chief Strategy Officer: None          Prior Functioning/Environment Level of Independence: Independent                 OT Problem List: Decreased activity tolerance;Decreased knowledge of use of DME or AE;Decreased range of motion;Impaired balance (sitting and/or standing);Decreased knowledge of precautions;Pain      OT Treatment/Interventions: Self-care/ADL training;DME and/or AE instruction;Therapeutic activities;Balance training;Therapeutic exercise;Patient/family education    OT Goals(Current goals can be found in the care plan section) Acute Rehab OT Goals Patient Stated Goal: brush my teeth and wash my face OT Goal Formulation: With patient Time For Goal Achievement: 09/22/18 Potential to Achieve Goals: Good  OT Frequency: Min 2X/week   Barriers to D/C:            Co-evaluation PT/OT/SLP Co-Evaluation/Treatment: Yes Reason for Co-Treatment: For patient/therapist safety;To address functional/ADL transfers PT goals addressed during session: Mobility/safety with mobility;Proper use of DME OT goals addressed during session: ADL's and self-care;Proper use of Adaptive equipment and DME      AM-PAC OT "6 Clicks" Daily Activity     Outcome Measure Help from another person eating meals?: None Help from another person taking care of personal  grooming?: A Little Help from another person toileting, which includes using toliet, bedpan, or urinal?: A Little Help from another person bathing (including washing, rinsing, drying)?: A Lot Help from another person to put on and taking off regular upper body clothing?: None Help from another person to put on and taking off regular lower body clothing?: A Lot 6 Click Score: 18   End of Session Equipment Utilized During Treatment: Gait belt;Rolling walker  Activity Tolerance: Patient tolerated treatment well Patient left: in chair;with call bell/phone within reach;with chair alarm set  OT Visit Diagnosis: Other abnormalities of gait and mobility (R26.89);Pain;Unsteadiness on feet (R26.81) Pain - Right/Left: Right Pain - part of body: Hip                Time: 1010-1047 OT Time Calculation (min): 37 min Charges:  OT General Charges $OT Visit: 1 Visit OT Evaluation $OT Eval Low Complexity: [place], MSOT, OTR/L Behavioral Health OT/ Acute Relief OT [facility] Office: [phone]  Zenovia Jarred 09/08/2018, 12:40 PM

## 2018-09-08 NOTE — Progress Notes (Signed)
Physical Therapy Treatment Patient Details Name: Blake [name] MRN: [id number] DOB: 09-27-1970 Today's Date: 09/08/2018    History of Present Illness Pt s/p R THR     PT Comments    Pt very cooperative and progressing with mobility but limited by pain and obesity.   Follow Up Recommendations  Follow surgeon's recommendation for DC plan and follow-up therapies     Equipment Recommendations  Rolling walker with 5" wheels    Recommendations for Other Services       Precautions / Restrictions Precautions Precautions: Fall Restrictions Weight Bearing Restrictions: No Other Position/Activity Restrictions: WBAT    Mobility  Bed Mobility Overal bed mobility: Needs Assistance Bed Mobility: Supine to Sit     Supine to sit: Min assist     General bed mobility comments: cues for sequencing, reliance on UEs and bed rails to pull to sitting  Transfers Overall transfer level: Needs assistance Equipment used: Rolling walker (2 wheeled) Transfers: Sit to/from Stand Sit to Stand: Min assist;Min guard;+2 safety/equipment         General transfer comment: cueing for hand placement  Ambulation/Gait Ambulation/Gait assistance: Min assist;+2 safety/equipment Gait Distance (Feet): 60 Feet(and 15' into bathroom) Assistive device: Rolling walker (2 wheeled) Gait Pattern/deviations: Step-to pattern;Decreased step length - right;Decreased step length - left;Shuffle;Trunk flexed Gait velocity: decr   General Gait Details: cues for sequence, posture and position from [employer]             Wheelchair Mobility    Modified Rankin (Stroke Patients Only)       Balance Overall balance assessment: Mild deficits observed, not formally tested                                          Cognition Arousal/Alertness: Awake/alert Behavior During Therapy: WFL for tasks assessed/performed Overall Cognitive Status: Within Functional Limits for tasks assessed                                         Exercises Total Joint Exercises Ankle Circles/Pumps: AROM;Both;15 reps;Supine    General Comments        Pertinent Vitals/Pain Pain Assessment: 0-10 Pain Score: 7  Pain Location: R hip Pain Descriptors / Indicators: Aching;Sore Pain Intervention(s): Limited activity within patient's tolerance;Monitored during session;Premedicated before session;Ice applied    Home Living Family/patient expects to be discharged to:: Private residence Living Arrangements: Alone Available Help at Discharge: Family;Available 24 hours/day Type of Home: House Home Access: Stairs to enter Entrance Stairs-Rails: Can reach both Home Layout: One level Home Equipment: None      Prior Function Level of Independence: Independent          PT Goals (current goals can now be found in the care plan section) Acute Rehab PT Goals Patient Stated Goal: Regain IND PT Goal Formulation: With patient Time For Goal Achievement: 09/14/18 Potential to Achieve Goals: Good Progress towards PT goals: Progressing toward goals    Frequency    7X/week      PT Plan Current plan remains appropriate    Co-evaluation   Reason for Co-Treatment: For patient/therapist safety PT goals addressed during session: Mobility/safety with mobility OT goals addressed during session: ADL's and self-care      AM-PAC PT "6 Clicks" Mobility   Outcome Measure  Help needed turning from your back to your side while in a flat bed without using bedrails?: A Little Help needed moving from lying on your back to sitting on the side of a flat bed without using bedrails?: A Little Help needed moving to and from a bed to a chair (including a wheelchair)?: A Little Help needed standing up from a chair using your arms (e.g., wheelchair or bedside chair)?: A Little Help needed to walk in hospital room?: A Little Help needed climbing 3-5 steps with a railing? : A Lot 6 Click Score:  17    End of Session Equipment Utilized During Treatment: Gait belt Activity Tolerance: Patient tolerated treatment well Patient left: in chair;with call bell/phone within reach Nurse Communication: Mobility status PT Visit Diagnosis: Difficulty in walking, not elsewhere classified (R26.2)     Time: 1016-1050 PT Time Calculation (min) (ACUTE ONLY): 34 min  Charges:  $Gait Training: 8-22 mins                     [place] Pager [phone] Office [phone]    Evan Osburn 09/08/2018, 12:47 PM

## 2018-09-08 NOTE — Progress Notes (Signed)
Patient was in pain and restless, repositioning self at time of vitals. Neila Gear, RN

## 2018-09-08 NOTE — Progress Notes (Signed)
Subjective: 1 Day Post-Op Procedure(s) (LRB): RIGHT TOTAL HIP ARTHROPLASTY ANTERIOR APPROACH (Right) Patient reports pain as moderate.    Objective: Vital signs in last 24 hours: Temp:  [97.5 F (36.4 C)-101.2 F (38.4 C)] 98.6 F (37 C) (02/29 1100) Pulse Rate:  [75-114] 113 (02/29 1100) Resp:  [14-18] 18 (02/29 1100) BP: (113-143)/(66-95) 139/95 (02/29 1100) SpO2:  [91 %-100 %] 94 % (02/29 1100)  Intake/Output from previous day: 02/28 0701 - 02/29 0700 In: 4405.5 [P.O.:1080; I.V.:3025.5; IV Piggyback:300] Out: 1650 [Urine:1250; Blood:400] Intake/Output this shift: Total I/O In: 480 [P.O.:480] Out: -   Recent Labs    09/08/18 0520  HGB 13.1   Recent Labs    09/08/18 0520  WBC 9.2  RBC 4.21*  HCT 42.3  PLT 174   Recent Labs    09/08/18 0520  NA 133*  K 4.1  CL 101  CO2 24  BUN 9  CREATININE 1.09  GLUCOSE 116*  CALCIUM 8.2*   No results for input(s): LABPT, INR in the last 72 hours.  Sensation intact distally Intact pulses distally Dorsiflexion/Plantar flexion intact Incision: scant drainage   Assessment/Plan: 1 Day Post-Op Procedure(s) (LRB): RIGHT TOTAL HIP ARTHROPLASTY ANTERIOR APPROACH (Right) Up with therapy Plan for discharge tomorrow Discharge home with home health      Mcarthur Rossetti 09/08/2018, 12:57 PM

## 2018-09-08 NOTE — Progress Notes (Signed)
   09/07/18 2020  MEWS Assessment  Is this an acute change? No  Patient was in pain and restless, repositioning in bed at time of vitals.

## 2018-09-09 NOTE — Plan of Care (Signed)
No needs at this time. Rn working on home equipment and ride home for patient.

## 2018-09-09 NOTE — Care Management Note (Signed)
Case Management Note  Patient Details  Name: Blake [name] MRN: [id number] Date of Birth: Dec 10, 1970  Subjective/Objective:   Pt s/p R THA                 Action/Plan: Spoke to pt at bedside. Pt [place] arranged with [facility], pt agreeable to [facility]. Contacted [facility] for DME, RW and 3n1 bedside commode wide to be delivered to room prior to dc.   Expected Discharge Date:  09/09/18               Expected Discharge Plan:  [place]  In-House Referral:  NA  Discharge planning Services  CM Consult  Post Acute Care Choice:  Home Health Choice offered to:  Patient  DME Arranged:  3-N-1, Walker rolling DME Agency:  [place]:  PT [place] Agency:  [facility]  Status of Service:  Completed, signed off  If discussed at [employer], dates discussed:    Additional Comments:  Erenest Rasher, RN 09/09/2018, 12:38 PM

## 2018-09-09 NOTE — Plan of Care (Signed)

## 2018-09-09 NOTE — Progress Notes (Signed)
Pt stable at time of d/c. Pt had home equipment in placed and was medicated for pain prior to d/c.

## 2018-09-09 NOTE — Progress Notes (Signed)
Occupational Therapy Treatment Patient Details Name: Blake [name] MRN: [id number] DOB: 02-21-1971 Today's Date: 09/09/2018    History of present illness Pt s/p R THR    OT comments  Pt. Seen for skilled OT session. Able to return demo of shower stall transfer S level of assistance.  Pts. Aunt present for session and reports she will be with him 24/7 as needed. Note d/c later today.    Follow Up Recommendations  Home health OT;Other (comment)    Equipment Recommendations  3 in 1 bedside commode;Other (comment)    Recommendations for Other Services      Precautions / Restrictions Precautions Precautions: Fall Restrictions Weight Bearing Restrictions: No Other Position/Activity Restrictions: WBAT       Mobility Bed Mobility Overal bed mobility: Needs Assistance Bed Mobility: Sidelying to Sit   Sidelying to sit: Supervision       General bed mobility comments: hob flat, intermittent use of bed rail but reviewed not to use due to he wont have it at home  Transfers Overall transfer level: Needs assistance Equipment used: Rolling walker (2 wheeled) Transfers: Sit to/from [facility] Sit to Stand: Supervision Stand pivot transfers: Supervision            Balance                                           ADL either performed or assessed with clinical judgement   ADL Overall ADL's : Needs assistance/impaired                       Lower Body Dressing Details (indicate cue type and reason): fully dressed, reports he only required assistance with R shoe but was able to don under garments and pants without assistance   Toilet Transfer Details (indicate cue type and reason): declined physical review, states he just finished using b.room before i came     Tub/ Shower Transfer: Walk-in shower;Ambulation;Rolling walker;Supervision/safety Tub/Shower Transfer Details (indicate cue type and reason): pt. able to return demo of safe  technique for stepping backwards into shower stall over simulated small ledge and then stepping back out over the ledge Functional mobility during ADLs: Min guard;Rolling walker General ADL Comments: "auntie" present for session. reports she will be with pt. assisting with meals and "whatever else he will let her assist with"     Vision       Perception     Praxis      Cognition Arousal/Alertness: Awake/alert Behavior During Therapy: WFL for tasks assessed/performed Overall Cognitive Status: Within Functional Limits for tasks assessed                                 General Comments: impatient, eager for d/c, rushing therapist asst. along with session        Exercises     Shoulder Instructions       General Comments      Pertinent Vitals/ Pain       Pain Assessment: No/denies pain  Home Living                                          Prior Functioning/Environment  Frequency  Min 2X/week        Progress Toward Goals  OT Goals(current goals can now be found in the care plan section)  Progress towards OT goals: Progressing toward goals     Plan      Co-evaluation                 AM-PAC OT "6 Clicks" Daily Activity     Outcome Measure   Help from another person eating meals?: None Help from another person taking care of personal grooming?: A Little Help from another person toileting, which includes using toliet, bedpan, or urinal?: A Little Help from another person bathing (including washing, rinsing, drying)?: A Lot Help from another person to put on and taking off regular upper body clothing?: None Help from another person to put on and taking off regular lower body clothing?: A Lot 6 Click Score: 18    End of Session Equipment Utilized During Treatment: Gait belt;Rolling walker  OT Visit Diagnosis: Other abnormalities of gait and mobility (R26.89);Pain;Unsteadiness on feet (R26.81) Pain -  Right/Left: Right Pain - part of body: Hip   Activity Tolerance Patient tolerated treatment well   Patient Left Other (comment)(seated eob)   Nurse Communication          Time: 7616-0737 OT Time Calculation (min): 8 min  Charges: OT General Charges $OT Visit: 1 Visit OT Treatments $Self Care/Home Management : 8-22 mins  Janice Coffin, COTA/L 09/09/2018, 11:51 AM

## 2018-09-09 NOTE — Progress Notes (Signed)
D/c instructions and education given on post op hip precautions. Pt stable at this time waiting on home equipment. No needs at this time. No changes to note overall. Pt to get one more physical therapy session prior to d/c.

## 2018-09-09 NOTE — Discharge Summary (Signed)
Discharge Diagnoses:  Principal Problem:   Unilateral primary osteoarthritis, right hip Active Problems:   Status post total replacement of right hip   Surgeries: Procedure(s): RIGHT TOTAL HIP ARTHROPLASTY ANTERIOR APPROACH on 09/07/2018    Consultants:   Discharged Condition: Improved  Hospital Course: Blake [name] is an 48 y.o. male who was admitted 09/07/2018 with a chief complaint of osteoarthritis right hip, with a final diagnosis of osteoarthritis right hip.  Patient was brought to the operating room on 09/07/2018 and underwent Procedure(s): RIGHT TOTAL HIP ARTHROPLASTY ANTERIOR APPROACH.    Patient was given perioperative antibiotics:  Anti-infectives (From admission, onward)   Start     Dose/Rate Route Frequency Ordered Stop   09/07/18 1630  ceFAZolin (ANCEF) IVPB 2g/100 mL premix     2 g 200 mL/hr over 30 Minutes Intravenous Every 6 hours 09/07/18 1340 09/07/18 2143   09/07/18 0600  ceFAZolin (ANCEF) 3 g in dextrose 5 % 50 mL IVPB     3 g 100 mL/hr over 30 Minutes Intravenous On call to O.R. 09/06/18 0757 09/07/18 1030    .  Patient was given sequential compression devices, early ambulation, and aspirin for DVT prophylaxis.  Recent vital signs:  Patient Vitals for the past 24 hrs:  BP Temp Temp src Pulse Resp SpO2  09/09/18 0513 122/72 99 F (37.2 C) Oral 93 16 95 %  09/08/18 2213 120/64 100 F (37.8 C) Oral 97 18 96 %  09/08/18 1405 (!) 116/50 100 F (37.8 C) Oral 98 18 97 %  09/08/18 1100 (!) 139/95 98.6 F (37 C) Oral (!) 113 18 94 %  .  Recent laboratory studies: Dg Pelvis Portable  Result Date: 09/07/2018 CLINICAL DATA:  Postop right hip replacement. EXAM: PORTABLE PELVIS 1-2 VIEWS COMPARISON:  Intraoperative images 09/07/2018 FINDINGS: Single view of the pelvis obtained. Right hip arthroplasty is present. The right hip arthroplasty appears located on this single view. Right femoral prosthesis is completely visualized without a periprosthetic fracture. No  gross abnormality to the left hip. The pelvic bony ring appears to be intact. IMPRESSION: Right hip arthroplasty without complicating features. Electronically Signed   By: Markus Daft M.D.   On: 09/07/2018 14:23   Dg C-arm 1-60 Min-no Report  Result Date: 09/07/2018 Fluoroscopy was utilized by the requesting physician.  No radiographic interpretation.   Dg Hip Operative Unilat W Or W/o Pelvis Right  Result Date: 09/07/2018 CLINICAL DATA:  Total right hip replacement. EXAM: OPERATIVE RIGHT HIP (WITH PELVIS IF PERFORMED) 2 VIEWS TECHNIQUE: Fluoroscopic spot image(s) were submitted for interpretation post-operatively. COMPARISON:  11/21/2017. FINDINGS: Total right hip replacement with anatomic alignment. No acute bony abnormality identified. Two views obtained. 0 minutes 31 seconds fluoroscopy time utilized. 9.9 mGy radiation dose. IMPRESSION: Total right hip replacement anatomic alignment. Electronically Signed   By: Marcello Moores  Register   On: 09/07/2018 13:34    Discharge Medications:   Allergies as of 09/09/2018   No Known Allergies     Medication List    STOP taking these medications   acetaminophen-codeine 300-30 MG tablet Commonly known as:  TYLENOL #3     TAKE these medications   aspirin 81 MG chewable tablet Chew 1 tablet (81 mg total) by mouth 2 (two) times daily.   DULoxetine 60 MG capsule Commonly known as:  CYMBALTA Take 1 capsule (60 mg total) by mouth daily.   gabapentin 600 MG tablet Commonly known as:  NEURONTIN Take 2 tablets (1,200 mg total) by mouth 3 (three) times daily.  meloxicam 15 MG tablet Commonly known as:  MOBIC Take 1 tablet (15 mg total) by mouth daily.   methocarbamol 500 MG tablet Commonly known as:  ROBAXIN Take 1 tablet (500 mg total) by mouth every 6 (six) hours as needed for muscle spasms.   oxyCODONE 5 MG immediate release tablet Commonly known as:  Oxy IR/ROXICODONE Take 1-2 tablets (5-10 mg total) by mouth every 4 (four) hours as needed for  moderate pain (pain score 4-6).   propranolol ER 60 MG 24 hr capsule Commonly known as:  INDERAL LA Take 1 capsule (60 mg total) by mouth daily.   varenicline 1 MG tablet Commonly known as:  CHANTIX CONTINUING MONTH PAK Take 1 tablet (1 mg total) by mouth 2 (two) times daily.            Durable Medical Equipment  (From admission, onward)         Start     Ordered   09/07/18 1233  DME 3 n 1  Once     09/07/18 1232   09/07/18 1233  DME Walker rolling  Once    Question:  Patient needs a walker to treat with the following condition  Answer:  Status post total replacement of right hip   09/07/18 1232          Diagnostic Studies: Dg Pelvis Portable  Result Date: 09/07/2018 CLINICAL DATA:  Postop right hip replacement. EXAM: PORTABLE PELVIS 1-2 VIEWS COMPARISON:  Intraoperative images 09/07/2018 FINDINGS: Single view of the pelvis obtained. Right hip arthroplasty is present. The right hip arthroplasty appears located on this single view. Right femoral prosthesis is completely visualized without a periprosthetic fracture. No gross abnormality to the left hip. The pelvic bony ring appears to be intact. IMPRESSION: Right hip arthroplasty without complicating features. Electronically Signed   By: Markus Daft M.D.   On: 09/07/2018 14:23   Dg C-arm 1-60 Min-no Report  Result Date: 09/07/2018 Fluoroscopy was utilized by the requesting physician.  No radiographic interpretation.   Dg Hip Operative Unilat W Or W/o Pelvis Right  Result Date: 09/07/2018 CLINICAL DATA:  Total right hip replacement. EXAM: OPERATIVE RIGHT HIP (WITH PELVIS IF PERFORMED) 2 VIEWS TECHNIQUE: Fluoroscopic spot image(s) were submitted for interpretation post-operatively. COMPARISON:  11/21/2017. FINDINGS: Total right hip replacement with anatomic alignment. No acute bony abnormality identified. Two views obtained. 0 minutes 31 seconds fluoroscopy time utilized. 9.9 mGy radiation dose. IMPRESSION: Total right hip  replacement anatomic alignment. Electronically Signed   By: Marcello Moores  Register   On: 09/07/2018 13:34    Patient benefited maximally from their hospital stay and there were no complications.     Disposition: Discharge disposition: 01-Home or Self Care      Discharge Instructions    Call MD / Call 911   Complete by:  As directed    If you experience chest pain or shortness of breath, CALL 911 and be transported to the hospital emergency room.  If you develope a fever above 101 F, pus (white drainage) or increased drainage or redness at the wound, or calf pain, call your surgeon's office.   Constipation Prevention   Complete by:  As directed    Drink plenty of fluids.  Prune juice may be helpful.  You may use a stool softener, such as Colace (over the counter) 100 mg twice a day.  Use MiraLax (over the counter) for constipation as needed.   Diet - low sodium heart healthy   Complete by:  As directed  Increase activity slowly as tolerated   Complete by:  As directed      Follow-up Information    Mcarthur Rossetti, MD Follow up in 2 week(s).   Specialty:  Orthopedic Surgery Contact information: [address] [place] [address] [phone]            Signed: Newt Minion 09/09/2018, 8:43 AM

## 2018-09-09 NOTE — Plan of Care (Signed)
Pt to d/c home today. No needs this am. Family at bedside.

## 2018-09-09 NOTE — Progress Notes (Signed)
Physical Therapy Treatment Patient Details Name: Blake [name] MRN: [id number] DOB: 1970-07-16 Today's Date: 09/09/2018    History of Present Illness Pt s/p R THR     PT Comments    On second attempt, pt agreeable to participate with PT.  Pt OOB unassisted and ambulated increased distance in hall as well as negotiating stairs with bil handrails.  Pt educated on car transfers but pt frequently attending to phone rather than focusing on information.  Pt eager for dc home this date.   Follow Up Recommendations  Follow surgeon's recommendation for DC plan and follow-up therapies     Equipment Recommendations  Rolling walker with 5" wheels    Recommendations for Other Services       Precautions / Restrictions Precautions Precautions: Fall Restrictions Weight Bearing Restrictions: No Other Position/Activity Restrictions: WBAT    Mobility  Bed Mobility Overal bed mobility: Needs Assistance Bed Mobility: Sidelying to Sit   Sidelying to sit: Supervision       General bed mobility comments: Increased time  Transfers Overall transfer level: Needs assistance Equipment used: Rolling walker (2 wheeled) Transfers: Sit to/from Stand Sit to Stand: Supervision Stand pivot transfers: Supervision       General transfer comment: cueing for hand placement  Ambulation/Gait Ambulation/Gait assistance: Min guard;Supervision Gait Distance (Feet): 170 Feet Assistive device: Rolling walker (2 wheeled) Gait Pattern/deviations: Decreased step length - right;Decreased step length - left;Shuffle;Trunk flexed;Step-to pattern;Step-through pattern Gait velocity: decr   General Gait Details: min cues for posture and position from RW   Stairs Stairs: Yes Stairs assistance: Min guard Stair Management: Two rails;Step to pattern;Forwards Number of Stairs: 3 General stair comments: cues for sequence   Wheelchair Mobility    Modified Rankin (Stroke Patients Only)       Balance  Overall balance assessment: Mild deficits observed, not formally tested                                          Cognition Arousal/Alertness: Awake/alert Behavior During Therapy: WFL for tasks assessed/performed Overall Cognitive Status: Within Functional Limits for tasks assessed                                 General Comments: impatient, eager for d/c, rushing therapist asst. along with session      Exercises      General Comments        Pertinent Vitals/Pain Pain Assessment: 0-10 Pain Score: 6  Pain Location: R hip Pain Descriptors / Indicators: Aching;Sore Pain Intervention(s): Limited activity within patient's tolerance;Monitored during session;Patient requesting pain meds-RN notified    Home Living                      Prior Function            PT Goals (current goals can now be found in the care plan section) Acute Rehab PT Goals Patient Stated Goal: Regain IND PT Goal Formulation: With patient Time For Goal Achievement: 09/14/18 Potential to Achieve Goals: Good Progress towards PT goals: Progressing toward goals    Frequency    7X/week      PT Plan Current plan remains appropriate    Co-evaluation              AM-PAC PT "6 Clicks" Mobility   Outcome Measure  Help needed turning from your back to your side while in a flat bed without using bedrails?: A Little Help needed moving from lying on your back to sitting on the side of a flat bed without using bedrails?: A Little Help needed moving to and from a bed to a chair (including a wheelchair)?: A Little Help needed standing up from a chair using your arms (e.g., wheelchair or bedside chair)?: A Little Help needed to walk in hospital room?: A Little Help needed climbing 3-5 steps with a railing? : A Little 6 Click Score: 18    End of Session Equipment Utilized During Treatment: Gait belt Activity Tolerance: Patient tolerated treatment well Patient  left: in chair;with call bell/phone within reach Nurse Communication: Mobility status PT Visit Diagnosis: Difficulty in walking, not elsewhere classified (R26.2)     Time: 7253-6644 PT Time Calculation (min) (ACUTE ONLY): 16 min  Charges:  $Gait Training: 8-22 mins                     [place] Pager [phone] Office [phone]    Shadiyah Wernli 09/09/2018, 1:19 PM

## 2018-09-10 ENCOUNTER — Encounter (HOSPITAL_COMMUNITY): Payer: Self-pay | Admitting: Orthopaedic Surgery

## 2018-09-11 ENCOUNTER — Other Ambulatory Visit (INDEPENDENT_AMBULATORY_CARE_PROVIDER_SITE_OTHER): Payer: Self-pay

## 2018-09-11 ENCOUNTER — Telehealth (INDEPENDENT_AMBULATORY_CARE_PROVIDER_SITE_OTHER): Payer: Self-pay | Admitting: Orthopaedic Surgery

## 2018-09-11 DIAGNOSIS — Z96641 Presence of right artificial hip joint: Secondary | ICD-10-CM

## 2018-09-11 NOTE — Telephone Encounter (Signed)
Ronalee Belts from [facility] called stating that he did not know that the medicaid is inactive and that they were going to establish care with him today, but since he does not have any insurance Sonia Side with [facility] recommended that he have OT PT.  [facility] CB#[phone].  Thank you.

## 2018-09-11 NOTE — Telephone Encounter (Signed)
Sent order to therapy 

## 2018-09-11 NOTE — Telephone Encounter (Signed)
That will be fine. 

## 2018-09-11 NOTE — Telephone Encounter (Signed)
Out patient therapy ok?

## 2018-09-12 ENCOUNTER — Encounter: Payer: Self-pay | Admitting: Physical Therapy

## 2018-09-12 ENCOUNTER — Other Ambulatory Visit: Payer: Self-pay

## 2018-09-12 ENCOUNTER — Ambulatory Visit: Payer: Self-pay | Attending: Orthopaedic Surgery | Admitting: Physical Therapy

## 2018-09-12 DIAGNOSIS — M25651 Stiffness of right hip, not elsewhere classified: Secondary | ICD-10-CM | POA: Insufficient documentation

## 2018-09-12 DIAGNOSIS — M25551 Pain in right hip: Secondary | ICD-10-CM | POA: Insufficient documentation

## 2018-09-12 DIAGNOSIS — R262 Difficulty in walking, not elsewhere classified: Secondary | ICD-10-CM | POA: Insufficient documentation

## 2018-09-13 NOTE — Patient Instructions (Signed)
LAQ, Quad set, heel slide , glut set

## 2018-09-13 NOTE — Therapy (Signed)
[place], [place], [address] Phone: [phone]   Fax:  [phone]  Physical Therapy Evaluation  Patient Details  Name: Blake [name] MRN: [id number] Date of Birth: 09-Apr-1971 Referring Provider (PT): Dr Jean Rosenthal    Encounter Date: 09/12/2018    Past Medical History:  Diagnosis Date  . Arthritis   . Back pain   . Dilated aortic root (HCC)    19mm ascending aorta by echo 09/2017, see 08-2018 echo in epic for newer results   . ERECTILE DYSFUNCTION, MILD 11/12/2007   Qualifier: Diagnosis of  By: Carolyne Littles    . History of blood transfusion    1997, received 15 units of blood   . Morbid obesity ([place])   . PVC's (premature ventricular contractions)   . TOBACCO ABUSE 11/12/2007   Qualifier: Diagnosis of  By: Carmie End MD, Junie Panning    . Tremor    head and hands , controlled on cymbalta    Past Surgical History:  Procedure Laterality Date  . FACIAL FRACTURE SURGERY  09/1995   left side of face crush injury, carnial fracture   . TOTAL HIP ARTHROPLASTY Right 09/07/2018   Procedure: RIGHT TOTAL HIP ARTHROPLASTY ANTERIOR APPROACH;  Surgeon: Mcarthur Rossetti, MD;  Location: [facility];  Service: Orthopedics;  Laterality: Right;    There were no vitals filed for this visit.   Subjective Assessment - 09/12/18 1507    Subjective  Patient is a 48 year old male S/P right hip replacement on 09/07/2018. He had an anterior approach. At this time he is having pain. He was not using an assitive device beofre the surgery     Limitations  Sitting;Walking    How long can you stand comfortably?  5-10 minutes     How long can you walk comfortably?  Limited community distances    Diagnostic tests  nothing post-op    Patient Stated Goals  Get back to walking correctly     Currently in Pain?  Yes    Pain Score  3     Pain Location  Hip    Pain Orientation  Left    Pain Descriptors / Indicators  Aching    Pain Type   Acute pain;Surgical pain    Pain Radiating Towards  Shooting pain in his ankle at times     Pain Onset  More than a month ago    Pain Frequency  Constant    Aggravating Factors   sleep, standing, and walking     Pain Relieving Factors  rest     Effect of Pain on Daily Activities  difficulty perfroming daily tasks          [facility] PT Assessment - 09/13/18 0001      Assessment   Medical Diagnosis  Right THA anterior     Referring Provider (PT)  Dr Jean Rosenthal     Onset Date/Surgical Date  09/07/18    Hand Dominance  Right    Next MD Visit  2 weeks     Prior Therapy  Had therapy for his back before       Precautions   Precautions  None      Restrictions   Weight Bearing Restrictions  No      Balance Screen   Has the patient fallen in the past 6 months  No    Has the patient had a decrease in activity level because of a fear of falling?  No    Is the patient reluctant to leave their home because of a fear of falling?   No      Home Environment   Additional Comments  5 steps into the house. Walk in shower.       Prior Function   Level of Independence  Independent    Vocation  Part time employment    Vocation Requirements  Was working on a [employer]     Leisure  walking, basketball       Cognition   Overall Cognitive Status  Within Functional Limits for tasks assessed    Attention  Focused    Focused Attention  Appears intact    Memory  Appears intact    Awareness  Appears intact    Problem Solving  Appears intact      Observation/Other Assessments   Observations  Has to shift frequently when sitting 2nd to discomofrt     Skin Integrity  well healing surgical wound       Sensation   Light Touch  Appears Intact      Coordination   Gross Motor Movements are Fluid and Coordinated  Yes    Fine Motor Movements are Fluid and Coordinated  Yes      Posture/Postural Control   Posture Comments  sits in posterior pelvictilt but reports that if he sits normal the  staples hit each other       AROM   Overall AROM Comments  pain with active flexion of the right hip in standing       PROM   Right/Left Hip  Right      Strength   Right/Left Hip  Right    Right Hip Flexion  3+/5    Right Hip ABduction  4/5    Right Hip ADduction  4+/5      Right Hip   Right Hip Flexion  85   with pain      Bed Mobility   Bed Mobility  Supine to Sit    Supine to Sit  Minimal Assistance - Patient > 75%      Transfers   Comments  slow transfer using bilateral upper extremitys and walker to transfer.       Ambulation/Gait   Gait Comments  decreased right hip flexion, single legstance time, and stride length; walks with flexed posture                Objective measurements completed on examination: See above findings.      [facility] PT Treatment/Exercise - 09/13/18 0001      Exercises   Exercises  Knee/Hip      Knee/Hip Exercises: Seated   Long Arc Quad Limitations  2x10       Knee/Hip Exercises: Supine   Quad Sets Limitations  x10     Heel Slides Limitations  with sheet x5 in pain free motion                PT Short Term Goals - 09/13/18 1622      PT SHORT TERM GOAL #1   Title  Patient will demonstrate 90 degrees of right hip flexion without pain     Baseline  range to 85 degrees but painful     Time  3    Period  Weeks    Status  New    Target Date  10/04/18      PT SHORT TERM GOAL #2   Title  Patient will  transfer sit to stand independently without UE assistance     Time  3    Period  Weeks    Status  New    Target Date  10/04/18      PT SHORT TERM GOAL #3   Title  Patient will demosntrate 4+/5 gross right hip strength     Baseline  3+/5 hip flexion 4/5 hip abduction     Time  3    Period  Weeks    Status  New    Target Date  10/04/18                Plan - 09/13/18 1609    Clinical Impression Statement  Patient is a 48 year old male S/P right anterior THA on 09/07/2018. He presents with expected  limitations in strength, hip mobility, and function. He is walking with walker. He has better then expected hip flexion at this time. He would benefit from skilled therapy for funtional mobility training, strengthening, and hip mobility training.     Personal Factors and Comorbidities  Comorbidity 2    Comorbidities  Low Back Pain, Obesity     Examination-Activity Limitations  Squat;Bed Mobility;Lift;Stairs;Caring for Others;Transfers    Examination-Participation Restrictions  Laundry;Shop;Cleaning;Meal Prep;Yard Work    Stability/Clinical Decision Making  Stable/Uncomplicated    Clinical Decision Making  Moderate    Rehab Potential  Good    PT Frequency  1x / week    PT Duration  3 weeks    PT Treatment/Interventions  ADLs/Self Care Home Management;Cryotherapy;Electrical Stimulation;Iontophoresis 4mg /ml Dexamethasone;Moist Heat;Ultrasound;Gait training;Stair training;Therapeutic activities;Therapeutic exercise;Neuromuscular re-education;Patient/family education;Manual techniques;Passive range of motion;Taping    PT Next Visit Plan  begin standing 3 way hip; LAQ , SAQ; 2 or 4 inch step if able; Nu-step if able; manual therapy if required but hip flexion is good; modalities PRN; gait training to cane when able;     PT Home Exercise Plan  quad set, glut set, LAQ       Patient will benefit from skilled therapeutic intervention in order to improve the following deficits and impairments:  Abnormal gait, Pain, Difficulty walking, Decreased activity tolerance, Decreased endurance, Decreased strength, Decreased range of motion, Decreased mobility, Improper body mechanics  Visit Diagnosis: Pain in right hip  Stiffness of right hip, not elsewhere classified  Difficulty in walking, not elsewhere classified     Problem List Patient Active Problem List   Diagnosis Date Noted  . Status post total replacement of right hip 09/07/2018  . Unilateral primary osteoarthritis, right hip 08/13/2018  .  Severe obesity (BMI >= 40) ([place]) 08/13/2018  . Chronic bilateral low back pain with bilateral sciatica 04/02/2018  . Epidural lipomatosis 04/02/2018  . Right hip pain 04/02/2018  . Preop cardiovascular exam 03/30/2018  . Dilated aortic root ([place])   . Chest pain 07/27/2017  . Palpitations 07/26/2017  . ERECTILE DYSFUNCTION, MILD 11/12/2007  . Tobacco abuse 11/12/2007    Carney Living PT DPT  09/13/2018, 5:18 PM  [facility] [address] [place], [place], [address] Phone: [phone]   Fax:  [phone]  Name: Blake [name] MRN: [id number] Date of Birth: 1970/09/10

## 2018-09-19 ENCOUNTER — Telehealth: Payer: Self-pay | Admitting: Physical Therapy

## 2018-09-19 ENCOUNTER — Ambulatory Visit: Payer: Self-pay | Admitting: Physical Therapy

## 2018-09-19 NOTE — Telephone Encounter (Signed)
Spoke to patient regarding no-show to appointment today. He reports difficulty setting up transportation through insurance for today however he will be able to attend his future scheduled appointments.

## 2018-09-20 ENCOUNTER — Encounter (INDEPENDENT_AMBULATORY_CARE_PROVIDER_SITE_OTHER): Payer: Self-pay | Admitting: Orthopaedic Surgery

## 2018-09-20 ENCOUNTER — Ambulatory Visit (INDEPENDENT_AMBULATORY_CARE_PROVIDER_SITE_OTHER): Payer: Self-pay | Admitting: Orthopaedic Surgery

## 2018-09-20 ENCOUNTER — Other Ambulatory Visit: Payer: Self-pay

## 2018-09-20 DIAGNOSIS — Z96641 Presence of right artificial hip joint: Secondary | ICD-10-CM

## 2018-09-20 MED ORDER — METHOCARBAMOL 500 MG PO TABS: 500.0000 mg | ORAL_TABLET | Freq: Four times a day (QID) | ORAL | 1 refills | Status: DC | PRN

## 2018-09-20 MED ORDER — OXYCODONE HCL 5 MG PO TABS: 5.0000 mg | ORAL_TABLET | ORAL | 0 refills | Status: DC | PRN

## 2018-09-20 NOTE — Progress Notes (Signed)
The patient is a very pleasant 48 year old gentleman who is 2 weeks tomorrow status post a right total hip arthroplasty.  He is someone with a BMI of over 40.  He weighs 365 pounds.  We are able to successfully replace his hip.  He is very happy overall.  He is ambulating with 1 crutch.  On examination his incision are the staples in place Steri-Strips.  Everything looks good thus far.  He does have a large seroma now probably drained at least almost 200 cc of fluid from his large thigh area.  This decompressed there and he felt great afterwards.  We will send in some oxycodone for him and have him stop his aspirin.  I will send in some more Robaxin as well.  I would like to see him back in just 2 weeks to make sure the incision is doing well and to see if we need to drain a seroma again.  All question concerns were answered and addressed.  No x-rays are needed in 2 weeks.

## 2018-09-21 ENCOUNTER — Ambulatory Visit (HOSPITAL_BASED_OUTPATIENT_CLINIC_OR_DEPARTMENT_OTHER): Payer: Self-pay | Attending: Internal Medicine | Admitting: Internal Medicine

## 2018-09-21 VITALS — Ht 74.0 in | Wt 363.0 lb

## 2018-09-21 DIAGNOSIS — G4733 Obstructive sleep apnea (adult) (pediatric): Secondary | ICD-10-CM

## 2018-09-21 DIAGNOSIS — R400 Somnolence: Secondary | ICD-10-CM | POA: Insufficient documentation

## 2018-09-21 MED FILL — METHOCARBAMOL 500 MG TABS: 500 | 10 days supply | Qty: 40 | Fill #0

## 2018-09-21 MED FILL — ?DULoxetine HCL 6OMG CP: 60 | 30 days supply | Qty: 30 | Fill #4

## 2018-09-21 MED FILL — PROPRANOLOL ER 60 MG CAP: 60 | 30 days supply | Qty: 30 | Fill #4

## 2018-09-25 ENCOUNTER — Ambulatory Visit: Payer: Self-pay | Admitting: Physical Therapy

## 2018-09-26 ENCOUNTER — Other Ambulatory Visit: Payer: Self-pay

## 2018-09-26 DIAGNOSIS — M25551 Pain in right hip: Secondary | ICD-10-CM

## 2018-09-26 DIAGNOSIS — D1779 Benign lipomatous neoplasm of other sites: Secondary | ICD-10-CM

## 2018-09-26 DIAGNOSIS — M5442 Lumbago with sciatica, left side: Principal | ICD-10-CM

## 2018-09-26 DIAGNOSIS — M5441 Lumbago with sciatica, right side: Principal | ICD-10-CM

## 2018-09-26 DIAGNOSIS — G8929 Other chronic pain: Secondary | ICD-10-CM

## 2018-09-27 ENCOUNTER — Other Ambulatory Visit: Payer: Self-pay

## 2018-09-27 ENCOUNTER — Encounter: Payer: Self-pay | Admitting: Physical Therapy

## 2018-09-27 ENCOUNTER — Ambulatory Visit: Payer: Self-pay | Admitting: Physical Therapy

## 2018-09-27 DIAGNOSIS — M25651 Stiffness of right hip, not elsewhere classified: Secondary | ICD-10-CM

## 2018-09-27 DIAGNOSIS — R262 Difficulty in walking, not elsewhere classified: Secondary | ICD-10-CM

## 2018-09-27 DIAGNOSIS — M25551 Pain in right hip: Secondary | ICD-10-CM

## 2018-09-28 ENCOUNTER — Encounter: Payer: Self-pay | Admitting: Physical Therapy

## 2018-09-28 ENCOUNTER — Ambulatory Visit (INDEPENDENT_AMBULATORY_CARE_PROVIDER_SITE_OTHER): Payer: Self-pay | Admitting: Primary Care

## 2018-09-28 ENCOUNTER — Encounter (INDEPENDENT_AMBULATORY_CARE_PROVIDER_SITE_OTHER): Payer: Self-pay | Admitting: Primary Care

## 2018-09-28 VITALS — BP 107/79 | HR 72 | Temp 97.8°F | Ht 74.0 in | Wt 355.8 lb

## 2018-09-28 DIAGNOSIS — G4733 Obstructive sleep apnea (adult) (pediatric): Secondary | ICD-10-CM

## 2018-09-28 DIAGNOSIS — Z760 Encounter for issue of repeat prescription: Secondary | ICD-10-CM

## 2018-09-28 DIAGNOSIS — D1779 Benign lipomatous neoplasm of other sites: Secondary | ICD-10-CM

## 2018-09-28 DIAGNOSIS — Z96641 Presence of right artificial hip joint: Secondary | ICD-10-CM

## 2018-09-28 DIAGNOSIS — M5441 Lumbago with sciatica, right side: Secondary | ICD-10-CM

## 2018-09-28 DIAGNOSIS — M5442 Lumbago with sciatica, left side: Secondary | ICD-10-CM

## 2018-09-28 DIAGNOSIS — Z720 Tobacco use: Secondary | ICD-10-CM

## 2018-09-28 DIAGNOSIS — E882 Lipomatosis, not elsewhere classified: Secondary | ICD-10-CM

## 2018-09-28 DIAGNOSIS — Z6841 Body Mass Index (BMI) 40.0 and over, adult: Secondary | ICD-10-CM

## 2018-09-28 DIAGNOSIS — G8929 Other chronic pain: Secondary | ICD-10-CM

## 2018-09-28 DIAGNOSIS — M25551 Pain in right hip: Secondary | ICD-10-CM

## 2018-09-28 MED ORDER — GABAPENTIN 600 MG PO TABS: 1200.0000 mg | ORAL_TABLET | Freq: Three times a day (TID) | ORAL | 3 refills | Status: DC

## 2018-09-28 MED ORDER — MELOXICAM 15 MG PO TABS: 15.0000 mg | ORAL_TABLET | Freq: Every day | ORAL | 0 refills | Status: DC

## 2018-09-28 MED ORDER — PROPRANOLOL HCL ER 60 MG PO CP24: 60.0000 mg | ORAL_CAPSULE | Freq: Every day | ORAL | 11 refills | Status: DC

## 2018-09-28 MED ORDER — SILDENAFIL CITRATE 100 MG PO TABS: 50.0000 mg | ORAL_TABLET | Freq: Every day | ORAL | 11 refills | Status: DC | PRN

## 2018-09-28 MED ORDER — DULOXETINE HCL 60 MG PO CPEP: 60.0000 mg | ORAL_CAPSULE | Freq: Every day | ORAL | 11 refills | Status: DC

## 2018-09-28 MED FILL — ?DULoxetine HCL 6OMG CP: 60 | 90 days supply | Qty: 90 | Fill #0

## 2018-09-28 MED FILL — PROPRANOLOL ER 60 MG CAP: 60 | 90 days supply | Qty: 90 | Fill #0

## 2018-09-28 MED FILL — !VIAGRA 100MG TABLET: 100 | 30 days supply | Qty: 10 | Fill #0

## 2018-09-28 MED FILL — MELOXICAM 15 MG TABLET: 15 | 30 days supply | Qty: 30 | Fill #0

## 2018-09-28 MED FILL — GABAPENTIN 600 MG TABLET: 600 | 90 days supply | Qty: 180 | Fill #0

## 2018-09-28 NOTE — Patient Instructions (Signed)

## 2018-09-28 NOTE — Progress Notes (Addendum)
Established Patient Office Visit  Subjective:  Patient ID: Blake [name], male    DOB: 10-09-1970  Age: 48 y.o. MRN: [id number]  CC:  Chief Complaint  Patient presents with  . Medication Refill  . Referral    to ortho for back pain    HPI Blake [name] presents for follow up and medication refills. Patient is also c/o back pain. Recently had knee replacement surgery. Past medical history of hives, LBPw/sciatica, DDD, epidural lipomatosis, OA right hip,tremors,cervical strain,and tobacco abuse. Presently using a walker for gait stability while knee is healing.  Past Medical History:  Diagnosis Date  . Arthritis   . Back pain   . Dilated aortic root (HCC)    39mm ascending aorta by echo 09/2017, see 08-2018 echo in epic for newer results   . ERECTILE DYSFUNCTION, MILD 11/12/2007   Qualifier: Diagnosis of  By: Carolyne Littles    . History of blood transfusion    1997, received 15 units of blood   . Morbid obesity ([place])   . PVC's (premature ventricular contractions)   . TOBACCO ABUSE 11/12/2007   Qualifier: Diagnosis of  By: Carmie End MD, Junie Panning    . Tremor    head and hands , controlled on cymbalta    Past Surgical History:  Procedure Laterality Date  . FACIAL FRACTURE SURGERY  09/1995   left side of face crush injury, carnial fracture   . 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 COPD Mother   . Leukemia Father   . Hypertension Sister   . Diabetes Sister     Social History   Socioeconomic History  . Marital status: Single    Spouse name: Not on file  . Number of children: Not on file  . Years of education: Not on file  . Highest education level: Not on file  Occupational History  . Not on file  Social Needs  . Financial resource strain: Not on file  . Food  insecurity:    Worry: Not on file    Inability: Not on file  . Transportation needs:    Medical: Not on file    Non-medical: Not on file  Tobacco Use  . Smoking status: Current Every Day Smoker    Packs/day: 0.25    Years: 30.00    Pack years: 7.50  . Smokeless tobacco: Never Used  . Tobacco comment: down to 3-4 cigarettes a day   Substance and Sexual Activity  . Alcohol use: Yes    Comment: Drink on weekends  . Drug use: No  . Sexual activity: Not Currently  Lifestyle  . Physical activity:    Days per week: Not on file    Minutes per session: Not on file  . Stress: Not on file  Relationships  . Social connections:    Talks on phone: Not on file    Gets together: Not on file    Attends religious service: Not on file    Active member of club or organization: Not on file    Attends meetings of clubs or organizations: Not on file    Relationship status: Not on file  . Intimate partner violence:    Fear of current or ex partner: Not on file    Emotionally abused: Not on file  Physically abused: Not on file    Forced sexual activity: Not on file  Other Topics Concern  . Not on file  Social History Narrative  . Not on file    Outpatient Medications Prior to Visit  Medication Sig Dispense Refill  . aspirin 81 MG chewable tablet Chew 1 tablet (81 mg total) by mouth 2 (two) times daily. 30 tablet 0  . methocarbamol (ROBAXIN) 500 MG tablet Take 1 tablet (500 mg total) by mouth every 6 (six) hours as needed for muscle spasms. 40 tablet 1  . oxyCODONE (OXY IR/ROXICODONE) 5 MG immediate release tablet Take 1-2 tablets (5-10 mg total) by mouth every 4 (four) hours as needed for moderate pain (pain score 4-6). 35 tablet 0  . DULoxetine (CYMBALTA) 60 MG capsule Take 1 capsule (60 mg total) by mouth daily. 30 capsule 11  . gabapentin (NEURONTIN) 600 MG tablet Take 2 tablets (1,200 mg total) by mouth 3 (three) times daily. 180 tablet 3  . meloxicam (MOBIC) 15 MG tablet Take 1 tablet  (15 mg total) by mouth daily. 30 tablet 0  . propranolol ER (INDERAL LA) 60 MG 24 hr capsule Take 1 capsule (60 mg total) by mouth daily. 30 capsule 11  . varenicline (CHANTIX CONTINUING MONTH PAK) 1 MG tablet Take 1 tablet (1 mg total) by mouth 2 (two) times daily. (Patient not taking: Reported on 09/28/2018) 30 tablet 2   No facility-administered medications prior to visit.     No Known Allergies  ROS Review of Systems  Constitutional: Positive for fatigue.       Daytime  HENT: Negative.   Eyes: Negative.   Respiratory: Negative.   Cardiovascular: Negative.   Gastrointestinal: Negative.   Endocrine: Negative.   Genitourinary: Negative.   Musculoskeletal: Positive for back pain and gait problem.  Skin: Negative.   Allergic/Immunologic: Negative.   Hematological: Negative.   Psychiatric/Behavioral: Negative.       Objective:    Physical Exam  Constitutional: He is oriented to person, place, and time. He appears well-developed and well-nourished.  HENT:  Head: Normocephalic and atraumatic.  Neck: Normal range of motion. Neck supple.  Cardiovascular: Normal rate and regular rhythm.  Pulmonary/Chest: Effort normal and breath sounds normal.  Coarse breath sounds   Abdominal: Soft. Bowel sounds are normal. He exhibits distension.  Musculoskeletal: Normal range of motion.     Comments: DDD , chronic back pain s/p knee replacement  Neurological: He is alert and oriented to person, place, and time.  Skin: Skin is warm.  Psychiatric: He has a normal mood and affect.    BP 107/79 (BP Location: Left Arm, Patient Position: Sitting, Cuff Size: Large)   Pulse 72   Temp 97.8 F (36.6 C) (Oral)   Ht 6\' 2"  (1.88 m)   Wt (!) 355 lb 12.8 oz (161.4 kg)   SpO2 96%   BMI 45.68 kg/m  Wt Readings from Last 3 Encounters:  09/28/18 (!) 355 lb 12.8 oz (161.4 kg)  09/21/18 (!) 363 lb (164.7 kg)  09/07/18 (!) 365 lb (165.6 kg)     There are no preventive care reminders to display for  this patient.  There are no preventive care reminders to display for this patient.  Lab Results  Component Value Date   TSH 1.640 06/22/2017   Lab Results  Component Value Date   WBC 9.2 09/08/2018   HGB 13.1 09/08/2018   HCT 42.3 09/08/2018   MCV 100.5 (H) 09/08/2018   PLT 174 09/08/2018  Lab Results  Component Value Date   NA 133 (L) 09/08/2018   K 4.1 09/08/2018   CO2 24 09/08/2018   GLUCOSE 116 (H) 09/08/2018   BUN 9 09/08/2018   CREATININE 1.09 09/08/2018   BILITOT 0.3 06/22/2017   ALKPHOS 60 06/22/2017   AST 19 06/22/2017   ALT 21 06/22/2017   PROT 6.5 06/22/2017   ALBUMIN 4.4 06/22/2017   CALCIUM 8.2 (L) 09/08/2018   ANIONGAP 8 09/08/2018   Lab Results  Component Value Date   CHOL 172 12/11/2007   Lab Results  Component Value Date   HDL 56 12/11/2007   Lab Results  Component Value Date   LDLCALC 94 12/11/2007   Lab Results  Component Value Date   TRIG 112 12/11/2007   Lab Results  Component Value Date   CHOLHDL 3.1 Ratio 12/11/2007   Lab Results  Component Value Date   HGBA1C 5.2 06/22/2017      Assessment & Plan:   Problem List Items Addressed This Visit    Tobacco abuse   Chronic bilateral low back pain with bilateral sciatica   Relevant Medications   DULoxetine (CYMBALTA) 60 MG capsule   gabapentin (NEURONTIN) 600 MG tablet   Other Relevant Orders   Ambulatory referral to Neurosurgery   Epidural lipomatosis   Right hip pain   Severe obesity (BMI >= 40) (HCC)   Status post total replacement of right hip - Primary   OSA (obstructive sleep apnea)    Other Visit Diagnoses    Medication refill       Relevant Medications   propranolol ER (INDERAL LA) 60 MG 24 hr capsule     Neill was seen today for medication refill and referral.  Diagnoses and all orders for this visit:  Status post total replacement of right hip Dr. Ninfa Linden currently taking PT   Chronic bilateral low back pain with bilateral sciatica -     Ambulatory  referral to Neurosurgery -     DULoxetine (CYMBALTA) 60 MG capsule; Take 1 capsule (60 mg total) by mouth daily. -     gabapentin (NEURONTIN) 600 MG tablet; Take 2 tablets (1,200 mg total) by mouth 3 (three) times daily. -     meloxicam (MOBIC) 15 MG tablet; Take 1 tablet (15 mg total) by mouth daily.  Severe obesity (BMI >= 40) ([place]). Weight is a contributing factor on back and knee pain. Isometric exercises daily   Tobacco abuse - Each visit d/w cessation   Epidural lipomatosis -     meloxicam (MOBIC) 15 MG tablet; Take 1 tablet (15 mg total) by mouth daily.  Right hip pain -     meloxicam (MOBIC) 15 MG tablet; Take 1 tablet (15 mg total) by mouth daily. OSA  Patient had a sleep study done due to daytime fatigue  Medication refill -     propranolol ER (INDERAL LA) 60 MG 24 hr capsule; Take 1 capsule (60 mg total) by mouth daily.  Other orders -     sildenafil (VIAGRA) 100 MG tablet; Take 0.5-1 tablets (50-100 mg total) by mouth daily as needed for erectile dysfunction.   Meds ordered this encounter  Medications  . DULoxetine (CYMBALTA) 60 MG capsule    Sig: Take 1 capsule (60 mg total) by mouth daily.    Dispense:  30 capsule    Refill:  11  . gabapentin (NEURONTIN) 600 MG tablet    Sig: Take 2 tablets (1,200 mg total) by mouth 3 (three) times daily.  Dispense:  180 tablet    Refill:  3  . meloxicam (MOBIC) 15 MG tablet    Sig: Take 1 tablet (15 mg total) by mouth daily.    Dispense:  30 tablet    Refill:  0  . propranolol ER (INDERAL LA) 60 MG 24 hr capsule    Sig: Take 1 capsule (60 mg total) by mouth daily.    Dispense:  30 capsule    Refill:  11  . sildenafil (VIAGRA) 100 MG tablet    Sig: Take 0.5-1 tablets (50-100 mg total) by mouth daily as needed for erectile dysfunction.    Dispense:  5 tablet    Refill:  11    Follow-up: Return in about 6 months (around 03/31/2019) for ED, obesity, back pain.    [name], NP

## 2018-09-28 NOTE — Therapy (Signed)
[place], [place], [address] Phone: [phone]   Fax:  [phone]  Physical Therapy Treatment  Patient Details  Name: Blake [name] MRN: [id number] Date of Birth: August 19, 1970 Referring Provider (PT): Dr Jean Rosenthal    Encounter Date: 09/27/2018  PT End of Session - 09/27/18 1415    Visit Number  2    Number of Visits  16    Date for PT Re-Evaluation  11/08/18    Authorization Type  CAFA     PT Start Time  8466    PT Stop Time  1456    PT Time Calculation (min)  43 min    Activity Tolerance  Patient tolerated treatment well    Behavior During Therapy  [facility] for tasks assessed/performed       Past Medical History:  Diagnosis Date  . Arthritis   . Back pain   . Dilated aortic root (HCC)    24mm ascending aorta by echo 09/2017, see 08-2018 echo in epic for newer results   . ERECTILE DYSFUNCTION, MILD 11/12/2007   Qualifier: Diagnosis of  By: Carolyne Littles    . History of blood transfusion    1997, received 15 units of blood   . Morbid obesity ([place])   . PVC's (premature ventricular contractions)   . TOBACCO ABUSE 11/12/2007   Qualifier: Diagnosis of  By: Carmie End MD, Junie Panning    . Tremor    head and hands , controlled on cymbalta    Past Surgical History:  Procedure Laterality Date  . FACIAL FRACTURE SURGERY  09/1995   left side of face crush injury, carnial fracture   . TOTAL HIP ARTHROPLASTY Right 09/07/2018   Procedure: RIGHT TOTAL HIP ARTHROPLASTY ANTERIOR APPROACH;  Surgeon: Mcarthur Rossetti, MD;  Location: [facility];  Service: Orthopedics;  Laterality: Right;    There were no vitals filed for this visit.  Subjective Assessment - 09/27/18 1412    Subjective  Patient reports he has been sore the last few days. He has sharp pains at times. He is still having some pain at ngiht. He is using the walker.     Limitations  Sitting;Walking    How long can you stand comfortably?  5-10 minutes     How long can you walk comfortably?  Limited community distances    Diagnostic tests  nothing post-op    Patient Stated Goals  Get back to walking correctly     Currently in Pain?  Yes    Pain Score  4     Pain Location  Hip    Pain Orientation  Right    Pain Descriptors / Indicators  Aching    Pain Type  Acute pain;Chronic pain    Pain Onset  More than a month ago    Pain Frequency  Constant    Aggravating Factors   sleep and standing     Pain Relieving Factors  rest    Effect of Pain on Daily Activities  difficulty perfroming daily tasks                        [facility] Adult PT Treatment/Exercise - 09/28/18 0001      Ambulation/Gait   Ambulation/Gait  Yes    Ambulation/Gait Assistance  4: Min guard;5: Supervision    Ambulation/Gait Assistance Details  200'    Gait Pattern  Step-through pattern;Decreased step length - right;Decreased stance time - right  Gait Comments  min guard at first but supervision as he ambualted more. Improved ability to stand.       Knee/Hip Exercises: Seated   Long Arc Quad Limitations  2x10     Abd/Adduction Limitations  clamshell 2x10 red       Knee/Hip Exercises: Supine   Quad Sets Limitations  2x10 5 sec hold     Short Arc Quad Sets Limitations  3x10    Heel Slides Limitations  with sheet x5 in pain free motion              PT Education - 09/27/18 1415    Education Details  reviewed HEP and symptom mangement     Person(s) Educated  Patient    Methods  Explanation;Demonstration;Tactile cues;Verbal cues    Comprehension  Verbalized understanding;Returned demonstration;Verbal cues required;Tactile cues required       PT Short Term Goals - 09/13/18 1622      PT SHORT TERM GOAL #1   Title  Patient will demonstrate 90 degrees of right hip flexion without pain     Baseline  range to 85 degrees but painful     Time  3    Period  Weeks    Status  New    Target Date  10/04/18      PT SHORT TERM GOAL #2   Title  Patient  will transfer sit to stand independently without UE assistance     Time  3    Period  Weeks    Status  New    Target Date  10/04/18      PT SHORT TERM GOAL #3   Title  Patient will demosntrate 4+/5 gross right hip strength     Baseline  3+/5 hip flexion 4/5 hip abduction     Time  3    Period  Weeks    Status  New    Target Date  10/04/18        PT Long Term Goals - 09/13/18 1719      PT LONG TERM GOAL #1   Title  Patient will ambualte 3000' without AD without increase dpain in order to improve ability to walk in the community     Time  8    Period  Weeks    Status  New      PT LONG TERM GOAL #2   Title  Patient will stand for 20 minutes without increased pain in order to perfrom ADL's     Baseline  can not stand more then 5 min without pain     Time  8    Period  Weeks    Status  New      PT LONG TERM GOAL #3   Title  Patient will demostrate a 40% limitation on FOTO     Time  8    Period  Weeks    Status  New    Target Date  10/25/18            Plan - 09/28/18 5188    Clinical Impression Statement  Patient continues to have paoin but was able to totlerate exercises. He was given marching and heel raises for home. He was advised to continue with his HEP. He actually walked better with a straight cane.     Comorbidities  Low Back Pain, Obesity     Examination-Activity Limitations  Squat;Bed Mobility;Lift;Stairs;Caring for Others;Transfers    Examination-Participation Restrictions  Laundry;Shop;Cleaning;Meal Prep;Yard Work    Stability/Clinical  Decision Making  Stable/Uncomplicated    Clinical Decision Making  Moderate    Rehab Potential  Good    PT Frequency  2x / week    PT Duration  6 weeks    PT Treatment/Interventions  ADLs/Self Care Home Management;Cryotherapy;Electrical Stimulation;Iontophoresis 4mg /ml Dexamethasone;Moist Heat;Ultrasound;Gait training;Stair training;Therapeutic activities;Therapeutic exercise;Neuromuscular re-education;Patient/family  education;Manual techniques;Passive range of motion;Taping    PT Next Visit Plan  begin standing 3 way hip; LAQ , SAQ; 2 or 4 inch step if able; Nu-step if able; manual therapy if required but hip flexion is good; modalities PRN; gait training to cane when able;     PT Home Exercise Plan  quad set, glut set, LAQ       Patient will benefit from skilled therapeutic intervention in order to improve the following deficits and impairments:  Abnormal gait, Pain, Difficulty walking, Decreased activity tolerance, Decreased endurance, Decreased strength, Decreased range of motion, Decreased mobility, Improper body mechanics  Visit Diagnosis: Pain in right hip  Stiffness of right hip, not elsewhere classified  Difficulty in walking, not elsewhere classified     Problem List Patient Active Problem List   Diagnosis Date Noted  . Status post total replacement of right hip 09/07/2018  . Unilateral primary osteoarthritis, right hip 08/13/2018  . Severe obesity (BMI >= 40) ([place]) 08/13/2018  . Chronic bilateral low back pain with bilateral sciatica 04/02/2018  . Epidural lipomatosis 04/02/2018  . Right hip pain 04/02/2018  . Preop cardiovascular exam 03/30/2018  . Dilated aortic root ([place])   . Chest pain 07/27/2017  . Palpitations 07/26/2017  . ERECTILE DYSFUNCTION, MILD 11/12/2007  . Tobacco abuse 11/12/2007    Carney Living PT DPT 09/28/2018, 9:10 AM  [facility] [address] [place], [place], [address] Phone: [phone]   Fax:  [phone]  Name: Blake [name] MRN: [id number] Date of Birth: [birth year]

## 2018-10-02 ENCOUNTER — Ambulatory Visit: Payer: Self-pay | Admitting: Physical Therapy

## 2018-10-04 ENCOUNTER — Ambulatory Visit: Payer: Self-pay | Admitting: Physical Therapy

## 2018-10-05 ENCOUNTER — Telehealth (INDEPENDENT_AMBULATORY_CARE_PROVIDER_SITE_OTHER): Payer: Self-pay

## 2018-10-05 NOTE — Telephone Encounter (Signed)
Called pt and answered NO to all COVID-19 pre screen questions. Pt has an appt with CB on Monday

## 2018-10-07 DIAGNOSIS — R400 Somnolence: Secondary | ICD-10-CM

## 2018-10-07 NOTE — Procedures (Signed)
    Patient Name: Blake, [name] Date: 09/21/2018 Gender: Male D.O.B: Jun 18, 1971 Age (years): 47 Referring Provider: Clent Demark PA Height (inches): 75 Interpreting Physician: Baird Lyons MD, ABSM Weight (lbs): 353 RPSGT: Lanae Boast BMI: 44 MRN: [id number] Neck Size: 19.50  CLINICAL INFORMATION Sleep Study Type: NPSG Indication for sleep study: Daytime Fatigue Epworth Sleepiness Score: 8  SLEEP STUDY TECHNIQUE As per the AASM Manual for the Scoring of Sleep and Associated Events v2.3 (April 2016) with a hypopnea requiring 4% desaturations.  The channels recorded and monitored were frontal, central and occipital EEG, electrooculogram (EOG), submentalis EMG (chin), nasal and oral airflow, thoracic and abdominal wall motion, anterior tibialis EMG, snore microphone, electrocardiogram, and pulse oximetry.  MEDICATIONS Medications self-administered by patient taken the night of the study : none reported  SLEEP ARCHITECTURE The study was initiated at 10:49:58 PM and ended at 5:09:22 AM.  Sleep onset time was 56.1 minutes and the sleep efficiency was 65.9%. The total sleep time was 250 minutes.  Stage REM latency was 205.5 minutes.  The patient spent 9.60% of the night in stage N1 sleep, 73.20% in stage N2 sleep, 2.00% in stage N3 and 15.2% in REM.  Alpha intrusion was absent.  Supine sleep was 4.20%.  RESPIRATORY PARAMETERS The overall apnea/hypopnea index (AHI) was 32.4 per hour. There were 65 total apneas, including 64 obstructive, 0 central and 1 mixed apneas. There were 70 hypopneas and 14 RERAs.  The AHI during Stage REM sleep was 45.8 per hour.  AHI while supine was 40.0 per hour.  The mean oxygen saturation was 90.19%. The minimum SpO2 during sleep was 64.00%.  loud snoring was noted during this study.  CARDIAC DATA The 2 lead EKG demonstrated sinus rhythm. The mean heart rate was 81.91 beats per minute. Other EKG findings include: PVCs.   LEG MOVEMENT DATA The total PLMS were 78 with a resulting PLMS index of 18.72. Associated arousal with leg movement index was 1.9 .  IMPRESSIONS - Moderate obstructive sleep apnea occurred during this study (AHI = 32.4/h). - No significant central sleep apnea occurred during this study (CAI = 0.0/h). - Oxygen desaturation was noted during this study (Min O2 = 64.00%). Mean sat 90%. - The patient snored with loud snoring volume. - EKG findings include PVCs. - Mild periodic limb movements of sleep occurred during the study. No significant associated arousals.  DIAGNOSIS - Obstructive Sleep Apnea (327.23 [G47.33 ICD-10])  RECOMMENDATIONS - Suggest CPAP autopap 5-20, mask of choice, humidifier, hose, filters and supplies, download card - Be careful with alcohol, sedatives and other CNS depressants that may worsen sleep apnea and disrupt normal sleep architecture. - Sleep hygiene should be reviewed to assess factors that may improve sleep quality. - Weight management and regular exercise should be initiated or continued if appropriate.  [Electronically signed] 10/07/2018 01:03 PM  Baird Lyons MD, [place], American Board of Sleep Medicine   NPI: [id number]                         [place], [place] [facility]  ELECTRONICALLY SIGNED ON:  10/07/2018, 1:00 PM [place] PH: [phone]   FX: [phone] [place]

## 2018-10-08 ENCOUNTER — Other Ambulatory Visit: Payer: Self-pay | Admitting: Primary Care

## 2018-10-08 ENCOUNTER — Ambulatory Visit (INDEPENDENT_AMBULATORY_CARE_PROVIDER_SITE_OTHER): Payer: Medicaid Other | Admitting: Orthopaedic Surgery

## 2018-10-08 DIAGNOSIS — G4733 Obstructive sleep apnea (adult) (pediatric): Secondary | ICD-10-CM

## 2018-10-09 ENCOUNTER — Telehealth: Payer: Self-pay

## 2018-10-09 ENCOUNTER — Ambulatory Visit: Payer: Self-pay | Admitting: Physical Therapy

## 2018-10-09 NOTE — Telephone Encounter (Signed)
Call placed to the patient and explained to him the results of his sleep study and the need for CPAP.  He said that he does not have insurance to pay for the machine and he is not working.  Explained to him that  [employer] has a hardship program for CPAP machines for people who are uninsured or underinsured and not able to afford the cost of a machine.  He was agreeable to having a referral made and authorized release of his medical, demographic and financial information to [facility] medical supply.    He also inquired about the status of his orange Card and would like to speak to Wynonia Hazard, [place] Counselor as he will need to make an appointment to renew is [facility] financial assistance and [employer].  Message sent to [facility] requesting he contact the patient.     Referral for CPAP autopap faxed to [facility]

## 2018-10-11 ENCOUNTER — Ambulatory Visit: Payer: Self-pay | Admitting: Physical Therapy

## 2018-10-17 ENCOUNTER — Telehealth: Payer: Self-pay | Admitting: Physical Therapy

## 2018-10-17 ENCOUNTER — Telehealth (INDEPENDENT_AMBULATORY_CARE_PROVIDER_SITE_OTHER): Payer: Self-pay

## 2018-10-17 NOTE — Telephone Encounter (Signed)
Spoke with patient regarding high priority clinic visits. He is having some trouble with his hip and feels like an in person visit would be approriate. He needs some advanced notice to set up his transportation. He may also be interested in tele-health. Therapy will email information on tele-health and schedule patient for an in person visit. He was advised if things change and he feels uncomfortable coming out to call us.,

## 2018-10-17 NOTE — Telephone Encounter (Signed)
Called patient to call back and confirm appointment tomorrow and to answer our screening questions; no answer, no voicemail box

## 2018-10-18 ENCOUNTER — Other Ambulatory Visit: Payer: Self-pay

## 2018-10-18 ENCOUNTER — Other Ambulatory Visit (INDEPENDENT_AMBULATORY_CARE_PROVIDER_SITE_OTHER): Payer: Self-pay

## 2018-10-18 ENCOUNTER — Ambulatory Visit (INDEPENDENT_AMBULATORY_CARE_PROVIDER_SITE_OTHER): Payer: Medicaid Other | Admitting: Orthopaedic Surgery

## 2018-10-18 ENCOUNTER — Other Ambulatory Visit (INDEPENDENT_AMBULATORY_CARE_PROVIDER_SITE_OTHER): Payer: Self-pay | Admitting: Primary Care

## 2018-10-18 ENCOUNTER — Telehealth: Payer: Self-pay

## 2018-10-18 ENCOUNTER — Telehealth (INDEPENDENT_AMBULATORY_CARE_PROVIDER_SITE_OTHER): Payer: Self-pay

## 2018-10-18 ENCOUNTER — Encounter (INDEPENDENT_AMBULATORY_CARE_PROVIDER_SITE_OTHER): Payer: Self-pay | Admitting: Orthopaedic Surgery

## 2018-10-18 DIAGNOSIS — G8929 Other chronic pain: Secondary | ICD-10-CM

## 2018-10-18 DIAGNOSIS — D1779 Benign lipomatous neoplasm of other sites: Secondary | ICD-10-CM

## 2018-10-18 DIAGNOSIS — M5442 Lumbago with sciatica, left side: Principal | ICD-10-CM

## 2018-10-18 DIAGNOSIS — M25551 Pain in right hip: Secondary | ICD-10-CM

## 2018-10-18 DIAGNOSIS — M5441 Lumbago with sciatica, right side: Principal | ICD-10-CM

## 2018-10-18 DIAGNOSIS — Z96641 Presence of right artificial hip joint: Secondary | ICD-10-CM

## 2018-10-18 MED ORDER — TIZANIDINE HCL 4 MG PO TABS: 4.0000 mg | ORAL_TABLET | Freq: Three times a day (TID) | ORAL | 1 refills | Status: DC | PRN

## 2018-10-18 MED ORDER — IBUPROFEN 800 MG PO TABS: 800.0000 mg | ORAL_TABLET | Freq: Three times a day (TID) | ORAL | 1 refills | Status: DC | PRN

## 2018-10-18 MED FILL — tiZANidine HCL 4 MG TABS: 4 | 20 days supply | Qty: 60 | Fill #0

## 2018-10-18 MED FILL — ?IBUPROFEN 800 MG TABS: AMNEAL | 30 days supply | Qty: 90 | Fill #0

## 2018-10-18 MED FILL — !VIAGRA 100MG TABLET: 100 | 30 days supply | Qty: 10 | Fill #1

## 2018-10-18 NOTE — Telephone Encounter (Signed)
Call placed to [facility] to inquire about the status of the CPAP referral. Spoke to [facility] who stated that they need documentation of a face to face encounter with the provider prior to the diagnosis of sleep apnea. Documents faxed as requested.

## 2018-10-18 NOTE — Progress Notes (Signed)
Patient is now 6 weeks status post a right total hip arthroplasty through direct anterior bridge.  He is someone who does weigh 355 pounds.  The hip is doing okay for him.  At his last visit I drained a large seroma.  On examination today the his incision looks great and there is no seroma at all.  There is no redness.  He is ambulating with a walker.  He was originally going to be also referred by Dr. Durward Fortes to Dr. Ernestina Patches for spine intervention.  I did review an MRI of his lumbar spine from last year and it showed really just facet joint arthritis at L4-L5 bilaterally.  This seems to correlate on his clinical exam where he hurts.  He also understands that weight loss and core strengthening will help his back.  He has been on oxycodone but now that is been 6 weeks a total we need him off of that.  He wanted me [facility]  We will also try some Zanaflex.  There is nothing else worrisome on his clinical exam today.  I will see him back in 4 weeks to see how he is doing overall.  In the interim hopefully we can have Dr. Ernestina Patches see him for bilateral L4-L5 facet joint injections.

## 2018-10-19 MED FILL — MELOXICAM 15 MG TABLET: 15 | 30 days supply | Qty: 30 | Fill #0

## 2018-10-24 ENCOUNTER — Ambulatory Visit: Payer: Self-pay | Attending: Orthopaedic Surgery | Admitting: Physical Therapy

## 2018-10-24 ENCOUNTER — Other Ambulatory Visit: Payer: Self-pay

## 2018-10-24 ENCOUNTER — Encounter: Payer: Self-pay | Admitting: Physical Therapy

## 2018-10-24 DIAGNOSIS — R262 Difficulty in walking, not elsewhere classified: Secondary | ICD-10-CM | POA: Insufficient documentation

## 2018-10-24 DIAGNOSIS — M25651 Stiffness of right hip, not elsewhere classified: Secondary | ICD-10-CM | POA: Insufficient documentation

## 2018-10-24 DIAGNOSIS — M5441 Lumbago with sciatica, right side: Secondary | ICD-10-CM | POA: Insufficient documentation

## 2018-10-24 DIAGNOSIS — M25551 Pain in right hip: Secondary | ICD-10-CM | POA: Insufficient documentation

## 2018-10-24 DIAGNOSIS — M6281 Muscle weakness (generalized): Secondary | ICD-10-CM | POA: Insufficient documentation

## 2018-10-24 DIAGNOSIS — R293 Abnormal posture: Secondary | ICD-10-CM | POA: Insufficient documentation

## 2018-10-24 DIAGNOSIS — M62838 Other muscle spasm: Secondary | ICD-10-CM | POA: Insufficient documentation

## 2018-10-24 NOTE — Therapy (Signed)
[place], [place], [address] Phone: [phone]   Fax:  [phone]  Physical Therapy Treatment  Patient Details  Name: Blake [name] MRN: [id number] Date of Birth: 01-12-1971 Referring Provider (PT): Dr Jean Rosenthal    Encounter Date: 10/24/2018  PT End of Session - 10/24/18 0941    Visit Number  3    Number of Visits  16    Date for PT Re-Evaluation  11/08/18    PT Start Time  0930    PT Stop Time  1013    PT Time Calculation (min)  43 min    Activity Tolerance  Patient tolerated treatment well    Behavior During Therapy  [facility] for tasks assessed/performed       Past Medical History:  Diagnosis Date  . Arthritis   . Back pain   . Dilated aortic root (HCC)    19mm ascending aorta by echo 09/2017, see 08-2018 echo in epic for newer results   . ERECTILE DYSFUNCTION, MILD 11/12/2007   Qualifier: Diagnosis of  By: Carolyne Littles    . History of blood transfusion    1997, received 15 units of blood   . Morbid obesity ([place])   . PVC's (premature ventricular contractions)   . TOBACCO ABUSE 11/12/2007   Qualifier: Diagnosis of  By: Carmie End MD, Junie Panning    . Tremor    head and hands , controlled on cymbalta    Past Surgical History:  Procedure Laterality Date  . FACIAL FRACTURE SURGERY  09/1995   left side of face crush injury, carnial fracture   . TOTAL HIP ARTHROPLASTY Right 09/07/2018   Procedure: RIGHT TOTAL HIP ARTHROPLASTY ANTERIOR APPROACH;  Surgeon: Mcarthur Rossetti, MD;  Location: [facility];  Service: Orthopedics;  Laterality: Right;    There were no vitals filed for this visit.  Subjective Assessment - 10/24/18 0937    Subjective  Patient reports it has been more sore lately. He feel slike he is toe out but when he corrects it he feels more pain. He could not find a cane tall enough for him.     Limitations  Sitting;Walking    How long can you stand comfortably?  5-10 minutes     How long  can you walk comfortably?  Limited community distances    Diagnostic tests  nothing post-op    Patient Stated Goals  Get back to walking correctly     Currently in Pain?  Yes    Pain Score  5     Pain Location  Hip    Pain Orientation  Right    Pain Descriptors / Indicators  Aching    Pain Type  Acute pain    Pain Onset  More than a month ago    Pain Frequency  Constant    Aggravating Factors   sleep and standing     Pain Relieving Factors  rest     Effect of Pain on Daily Activities  difficulty perfroming daily tasks                        [facility] Adult PT Treatment/Exercise - 10/24/18 0001      Knee/Hip Exercises: Stretches   Active Hamstring Stretch Limitations  reviewed seated 2x30 sec hold mod cuing for technique       Knee/Hip Exercises: Standing   Hip Flexion Limitations  standing march x10 reported minor increase in pain  Abduction Limitations  2x10 right only     Extension Limitations  2x10 right       Knee/Hip Exercises: Seated   Long Arc Quad Limitations  2x10     Abd/Adduction Limitations  clamshell 2x10 red       Manual Therapy   Manual Therapy  Soft tissue mobilization;Passive ROM    Soft tissue mobilization  to anterior hip supine and lateral hip sidelying using tiger tail.     Passive ROM  Gentle hip IR into neutral position. Patient reported minor increse in pain so stretching halted              PT Education - 10/24/18 0940    Education Details  reviewed HEp and symptom mangement     Person(s) Educated  Patient    Methods  Explanation;Demonstration;Tactile cues;Verbal cues    Comprehension  Verbalized understanding;Returned demonstration;Verbal cues required;Tactile cues required       PT Short Term Goals - 09/13/18 1622      PT SHORT TERM GOAL #1   Title  Patient will demonstrate 90 degrees of right hip flexion without pain     Baseline  range to 85 degrees but painful     Time  3    Period  Weeks    Status  New    Target  Date  10/04/18      PT SHORT TERM GOAL #2   Title  Patient will transfer sit to stand independently without UE assistance     Time  3    Period  Weeks    Status  New    Target Date  10/04/18      PT SHORT TERM GOAL #3   Title  Patient will demosntrate 4+/5 gross right hip strength     Baseline  3+/5 hip flexion 4/5 hip abduction     Time  3    Period  Weeks    Status  New    Target Date  10/04/18        PT Long Term Goals - 09/13/18 1719      PT LONG TERM GOAL #1   Title  Patient will ambualte 3000' without AD without increase dpain in order to improve ability to walk in the community     Time  8    Period  Weeks    Status  New      PT LONG TERM GOAL #2   Title  Patient will stand for 20 minutes without increased pain in order to perfrom ADL's     Baseline  can not stand more then 5 min without pain     Time  8    Period  Weeks    Status  New      PT LONG TERM GOAL #3   Title  Patient will demostrate a 40% limitation on FOTO     Time  8    Period  Weeks    Status  New    Target Date  10/25/18            Plan - 10/24/18 1216    Clinical Impression Statement  Patient continues to have weakness in his left hip with functional activity. He could not find a cane his size. He walked in the clinic without a waleker and had a significant [place]elenburgh gait pattern.  Therapy foucsed on glut strengthening and quad. Therapy also worked on manual therapy to the glut and anterior hip. Since his last visit  he gfeels like he is having difficulty straightening out his leg.     Comorbidities  Low Back Pain, Obesity     Examination-Activity Limitations  Squat;Bed Mobility;Lift;Stairs;Caring for Others;Transfers    Examination-Participation Restrictions  Laundry;Shop;Cleaning;Meal Prep;Yard Work    Stability/Clinical Decision Making  Stable/Uncomplicated    Clinical Decision Making  Moderate    Rehab Potential  Good    PT Frequency  2x / week    PT Duration  6 weeks    PT  Treatment/Interventions  ADLs/Self Care Home Management;Cryotherapy;Electrical Stimulation;Iontophoresis 4mg /ml Dexamethasone;Moist Heat;Ultrasound;Gait training;Stair training;Therapeutic activities;Therapeutic exercise;Neuromuscular re-education;Patient/family education;Manual techniques;Passive range of motion;Taping    PT Next Visit Plan  begin standing 3 way hip; LAQ , SAQ; 2 or 4 inch step if able; Nu-step if able; manual therapy if required but hip flexion is good; modalities PRN; gait training to cane when able;     PT Home Exercise Plan  quad set, glut set, LAQ    Consulted and Agree with Plan of Care  Patient       Patient will benefit from skilled therapeutic intervention in order to improve the following deficits and impairments:  Abnormal gait, Pain, Difficulty walking, Decreased activity tolerance, Decreased endurance, Decreased strength, Decreased range of motion, Decreased mobility, Improper body mechanics  Visit Diagnosis: Pain in right hip  Stiffness of right hip, not elsewhere classified  Difficulty in walking, not elsewhere classified  Bilateral low back pain with right-sided sciatica, unspecified chronicity  Muscle weakness (generalized)  Muscle spasm of right lower extremity  Abnormal posture     Problem List Patient Active Problem List   Diagnosis Date Noted  . Status post total replacement of right hip 09/07/2018  . Unilateral primary osteoarthritis, right hip 08/13/2018  . Severe obesity (BMI >= 40) ([place]) 08/13/2018  . Chronic bilateral low back pain with bilateral sciatica 04/02/2018  . Epidural lipomatosis 04/02/2018  . Right hip pain 04/02/2018  . Preop cardiovascular exam 03/30/2018  . Dilated aortic root ([place])   . Chest pain 07/27/2017  . Palpitations 07/26/2017  . ERECTILE DYSFUNCTION, MILD 11/12/2007  . Tobacco abuse 11/12/2007    Carney Living PT DPT  10/24/2018, 12:25 PM  [facility] [address] [place], [place], [address] Phone: [phone]   Fax:  [phone]  Name: Blake [name] MRN: [id number] Date of Birth: 1971-07-09

## 2018-10-25 ENCOUNTER — Telehealth: Payer: Self-pay

## 2018-10-25 NOTE — Telephone Encounter (Signed)
Call received from [place].  She explained that she needs an addendum to the provider's note from 10/08/2018 stating that the patient had sleep study done due to daytime fatigue.  The documentation can be faxed to her attention - fax # [phone].   She stated that they have been in touch with the patient and his  patient's medicaid covers DME.  The hardship program is not needed at this time

## 2018-10-31 ENCOUNTER — Other Ambulatory Visit: Payer: Self-pay

## 2018-10-31 ENCOUNTER — Ambulatory Visit: Payer: Self-pay | Admitting: Physical Therapy

## 2018-10-31 ENCOUNTER — Encounter: Payer: Self-pay | Admitting: Physical Therapy

## 2018-10-31 DIAGNOSIS — G4733 Obstructive sleep apnea (adult) (pediatric): Secondary | ICD-10-CM | POA: Insufficient documentation

## 2018-10-31 DIAGNOSIS — M5441 Lumbago with sciatica, right side: Secondary | ICD-10-CM

## 2018-10-31 DIAGNOSIS — M6281 Muscle weakness (generalized): Secondary | ICD-10-CM

## 2018-10-31 DIAGNOSIS — M25551 Pain in right hip: Secondary | ICD-10-CM

## 2018-10-31 DIAGNOSIS — R262 Difficulty in walking, not elsewhere classified: Secondary | ICD-10-CM

## 2018-10-31 DIAGNOSIS — M25651 Stiffness of right hip, not elsewhere classified: Secondary | ICD-10-CM

## 2018-10-31 NOTE — Therapy (Addendum)
[place], [place], [address] Phone: [phone]   Fax:  [phone]  Physical Therapy Treatment/Discharge   Patient Details  Name: Blake [name] MRN: [id number] Date of Birth: [birth year] Referring Provider (PT): Dr Jean Rosenthal    Encounter Date: 10/31/2018  PT End of Session - 10/31/18 1103    Visit Number  4    Number of Visits  16    Date for PT Re-Evaluation  11/08/18    Authorization Type  CAFA     PT Start Time  0930    PT Stop Time  1010    PT Time Calculation (min)  40 min    Activity Tolerance  Patient tolerated treatment well    Behavior During Therapy  [facility] for tasks assessed/performed       Past Medical History:  Diagnosis Date  . Arthritis   . Back pain   . Dilated aortic root (HCC)    27m ascending aorta by echo 09/2017, see 08-2018 echo in epic for newer results   . ERECTILE DYSFUNCTION, MILD 11/12/2007   Qualifier: Diagnosis of  By: GCarolyne Littles   . History of blood transfusion    1997, received 15 units of blood   . Morbid obesity ([place]   . PVC's (premature ventricular contractions)   . TOBACCO ABUSE 11/12/2007   Qualifier: Diagnosis of  By: ECarmie EndMD, EJunie Panning   . Tremor    head and hands , controlled on cymbalta    Past Surgical History:  Procedure Laterality Date  . FACIAL FRACTURE SURGERY  09/1995   left side of face crush injury, carnial fracture   . TOTAL HIP ARTHROPLASTY Right 09/07/2018   Procedure: RIGHT TOTAL HIP ARTHROPLASTY ANTERIOR APPROACH;  Surgeon: BMcarthur Rossetti MD;  Location: [facility];  Service: Orthopedics;  Laterality: Right;    There were no vitals filed for this visit.  Subjective Assessment - 10/31/18 0935    Subjective  Patient presents to the clinic today without his walker. He reports the pain hasnt been too bad. His pain level this monring is about a 3-4    Limitations  Sitting;Walking    How long can you stand comfortably?  5-10  minutes     How long can you walk comfortably?  Limited community distances    Diagnostic tests  nothing post-op    Patient Stated Goals  Get back to walking correctly     Currently in Pain?  Yes    Pain Score  4     Pain Location  Hip    Pain Orientation  Right    Pain Descriptors / Indicators  Aching    Pain Type  Acute pain    Pain Onset  More than a month ago    Pain Frequency  Constant    Aggravating Factors   sleeping and standing     Pain Relieving Factors  rest                        [facility] Adult PT Treatment/Exercise - 10/31/18 0001      Knee/Hip Exercises: Standing   Hip Flexion Limitations  standing march x10 reported minor increase in pain     Abduction Limitations  x20 2lb right     Extension Limitations  x20 right x20     Forward Step Up  1 set;10 reps;Step Height: 4"    Functional Squat Limitations  reviewed proper squat  with cuing 2x10 rest break in between       Knee/Hip Exercises: Seated   Long Arc Quad Limitations  2x10     Abd/Adduction Limitations  clamshell 2x10 green       Knee/Hip Exercises: Supine   Quad Sets Limitations  2x10 with cuing to fire gluteal     Short Arc Quad Sets Limitations  3x10       Manual Therapy   Manual Therapy  Soft tissue mobilization;Passive ROM    Soft tissue mobilization  to anterior hip supine and lateral hip sidelying using tiger tail.              PT Education - 10/31/18 0936    Education Details  HEP, symptom mangement     Person(s) Educated  Patient    Methods  Tactile cues;Explanation;Demonstration;Verbal cues    Comprehension  Verbalized understanding;Returned demonstration;Verbal cues required;Need further instruction       PT Short Term Goals - 10/31/18 1517      PT SHORT TERM GOAL #1   Title  Patient will demonstrate 90 degrees of right hip flexion without pain     Baseline  range to 85 degrees but painful     Time  3    Period  Weeks    Status  Achieved    Target Date  10/04/18       PT SHORT TERM GOAL #2   Title  Patient will transfer sit to stand independently without UE assistance     Time  3    Period  Weeks    Status  Achieved      PT SHORT TERM GOAL #3   Title  Patient will demosntrate 4+/5 gross right hip strength     Baseline  3+/5 hip flexion 4/5 hip abduction     Time  3    Period  Weeks    Status  Achieved    Target Date  10/04/18        PT Long Term Goals - 09/13/18 1719      PT LONG TERM GOAL #1   Title  Patient will ambualte 3000' without AD without increase dpain in order to improve ability to walk in the community     Time  8    Period  Weeks    Status  New      PT LONG TERM GOAL #2   Title  Patient will stand for 20 minutes without increased pain in order to perfrom ADL's     Baseline  can not stand more then 5 min without pain     Time  8    Period  Weeks    Status  New      PT LONG TERM GOAL #3   Title  Patient will demostrate a 40% limitation on FOTO     Time  8    Period  Weeks    Status  New    Target Date  10/25/18            Plan - 10/31/18 1104    Clinical Impression Statement  Patient is making some progress. He had less tendenress in the anterior hip today with manual therapy. Therapy was able to begin functional training today inclduding squats and steps. He reported improved pain and decreased stiffness after treatment. He is walking without his walker. Therapy also added weights to hip PRE's. Therapy will continue to advance as tolerated. He is somewhat limited by baseline back issues.  Personal Factors and Comorbidities  Comorbidity 2    Comorbidities  Low Back Pain, Obesity     Examination-Activity Limitations  Squat;Bed Mobility;Lift;Stairs;Caring for Others;Transfers    Examination-Participation Restrictions  Laundry;Shop;Cleaning;Meal Prep;Yard Work    Stability/Clinical Decision Making  Stable/Uncomplicated    Clinical Decision Making  Moderate    Rehab Potential  Good    PT Frequency  2x / week     PT Duration  6 weeks    PT Treatment/Interventions  ADLs/Self Care Home Management;Cryotherapy;Electrical Stimulation;Iontophoresis 61m/ml Dexamethasone;Moist Heat;Ultrasound;Gait training;Stair training;Therapeutic activities;Therapeutic exercise;Neuromuscular re-education;Patient/family education;Manual techniques;Passive range of motion;Taping    PT Next Visit Plan  begin standing 3 way hip; LAQ , SAQ; 2 or 4 inch step if able; Nu-step if able; manual therapy if required but hip flexion is good; modalities PRN; gait training to cane when able;     PT Home Exercise Plan  quad set, glut set, LAQ    Consulted and Agree with Plan of Care  Patient       Patient will benefit from skilled therapeutic intervention in order to improve the following deficits and impairments:  Abnormal gait, Pain, Difficulty walking, Decreased activity tolerance, Decreased endurance, Decreased strength, Decreased range of motion, Decreased mobility, Improper body mechanics  Visit Diagnosis: No diagnosis found.  PHYSICAL THERAPY DISCHARGE SUMMARY  Visits from Start of Care: 4   Current functional level related to goals / functional outcomes: Unknown Patient did not return     Remaining deficits: Unknown    Education / Equipment: HEP   Plan: Patient agrees to discharge.  Patient goals were not met. Patient is being discharged due to not returning since the last visit.  ?????       Problem List Patient Active Problem List   Diagnosis Date Noted  . Status post total replacement of right hip 09/07/2018  . Unilateral primary osteoarthritis, right hip 08/13/2018  . Severe obesity (BMI >= 40) ([facility] 08/13/2018  . Chronic bilateral low back pain with bilateral sciatica 04/02/2018  . Epidural lipomatosis 04/02/2018  . Right hip pain 04/02/2018  . Preop cardiovascular exam 03/30/2018  . Dilated aortic root ([facility]   . Chest pain 07/27/2017  . Palpitations 07/26/2017  . ERECTILE DYSFUNCTION, MILD 11/12/2007   . Tobacco abuse 11/12/2007    DCarney Living PT DPT  10/31/2018, 3:18 PM  [facility] [address]Phone: [phone]  Fax:  [phone] Name: Blake ZAINOMRN: [id number] of Birth: 902-Jun-1972

## 2018-11-01 NOTE — Telephone Encounter (Signed)
Provider note from 09/28/2018 with addendum, faxed to [facility] as requested.

## 2018-11-06 ENCOUNTER — Telehealth: Payer: Self-pay

## 2018-11-06 ENCOUNTER — Ambulatory Visit: Payer: Self-pay | Admitting: Physical Therapy

## 2018-11-06 NOTE — Telephone Encounter (Signed)
Call placed to [facility] to check on status of CPAP referral.  Spoke to [facility] who stated that they have sent the clinical information to check for insurance authorization

## 2018-11-08 ENCOUNTER — Ambulatory Visit: Payer: Self-pay | Admitting: Physical Therapy

## 2018-11-13 MED FILL — tiZANidine HCL 4 MG TABS: 4 | 20 days supply | Qty: 60 | Fill #1

## 2018-11-13 MED FILL — ?IBUPROFEN 800 MG TABS: AMNEAL | 30 days supply | Qty: 90 | Fill #1

## 2018-11-13 MED FILL — MELOXICAM 15 MG TABLET: 15 | 30 days supply | Qty: 30 | Fill #1

## 2018-11-13 MED FILL — !VIAGRA 100MG TABLET: 100 | 30 days supply | Qty: 10 | Fill #2

## 2018-11-14 ENCOUNTER — Telehealth: Payer: Self-pay

## 2018-11-14 NOTE — Telephone Encounter (Signed)
Call placed to [facility] to check on status of CPAP referral. Spoke to [facility] who stated that the machine was ready for pick up but he has an out of pocket expense.  Then spoke to [place] who stated that the patient's insurance does not cover the CPAP and he would have an out of pocket expense of $227/month x 6 months.  They spoke to him about the cost and he said he would not be able to afford it and was going to contact medicaid to see if he would be eligible for full medicaid.  In the meantime, this CM requested that they reach out to him to discuss the hardship program and Herbie Baltimore said that she would call him to discuss.

## 2018-11-19 ENCOUNTER — Ambulatory Visit: Payer: Self-pay | Admitting: Orthopaedic Surgery

## 2018-11-22 DIAGNOSIS — Z96641 Presence of right artificial hip joint: Secondary | ICD-10-CM

## 2018-11-29 ENCOUNTER — Other Ambulatory Visit: Payer: Self-pay

## 2018-11-29 ENCOUNTER — Ambulatory Visit: Payer: Self-pay

## 2018-11-29 ENCOUNTER — Ambulatory Visit (INDEPENDENT_AMBULATORY_CARE_PROVIDER_SITE_OTHER): Payer: Medicaid Other | Admitting: Physical Medicine and Rehabilitation

## 2018-11-29 ENCOUNTER — Encounter: Payer: Self-pay | Admitting: Physical Medicine and Rehabilitation

## 2018-11-29 VITALS — BP 123/86 | HR 104

## 2018-11-29 DIAGNOSIS — G8929 Other chronic pain: Secondary | ICD-10-CM

## 2018-11-29 DIAGNOSIS — M47816 Spondylosis without myelopathy or radiculopathy, lumbar region: Secondary | ICD-10-CM

## 2018-11-29 DIAGNOSIS — D1779 Benign lipomatous neoplasm of other sites: Secondary | ICD-10-CM

## 2018-11-29 DIAGNOSIS — M545 Low back pain, unspecified: Secondary | ICD-10-CM

## 2018-11-29 MED ORDER — METHYLPREDNISOLONE ACETATE 80 MG/ML IJ SUSP
80.0000 mg | Freq: Once | INTRAMUSCULAR | Status: AC
  Administered 2018-11-29: 80 mg

## 2018-11-29 NOTE — Progress Notes (Signed)
[name] - 48 y.o. male MRN [id number]  Date of birth: Mar 14, 1971  Office Visit Note: Visit Date: 11/29/2018 PCP: [name], NP Referred by: [name], NP  Subjective: Chief Complaint  Patient presents with  . Lower Back - Pain   HPI:  Blake [name] is a 48 y.o. male who comes in today At the request of Dr. Jean Rosenthal for bilateral L4-5 facet joint blocks for chronic worsening severe right more than left low back pain without any referral down the legs.  Patient has more pain with going from sit to stand and with standing.  No paresthesias no focal weakness.  Patient's case is complicated by morbid obesity and chronic pain.  ROS Otherwise per HPI.  Assessment & Plan: Visit Diagnoses:  1. Spondylosis without myelopathy or radiculopathy, lumbar region   2. Chronic bilateral low back pain without sciatica   3. Epidural lipomatosis     Plan: No additional findings.   Meds & Orders:  Meds ordered this encounter  Medications  . methylPREDNISolone acetate (DEPO-MEDROL) injection 80 mg    Orders Placed This Encounter  Procedures  . Facet Injection  . XR C-ARM NO REPORT    Follow-up: Return if symptoms worsen or fail to improve.   Procedures: No procedures performed  Lumbar Facet Joint Intra-Articular Injection(s) with Fluoroscopic Guidance  Patient: Blake [name]      Date of Birth: 10-18-1970 MRN: [id number] PCP: [name], NP      Visit Date: 11/29/2018   Universal Protocol:    Date/Time: 11/29/2018  Consent Given By: the patient  Position: PRONE   Additional Comments: Vital signs were monitored before and after the procedure. Patient was prepped and draped in the usual sterile fashion. The correct patient, procedure, and site was verified.   Injection Procedure Details:  Procedure Site One Meds Administered:  Meds ordered this encounter  Medications  . methylPREDNISolone acetate (DEPO-MEDROL) injection 80 mg     Laterality: Bilateral  Location/Site:  L4-L5  Needle size: 22 guage  Needle type: Spinal  Needle Placement: Articular  Findings:  -Comments: Excellent flow of contrast producing a partial arthrogram.  Procedure Details: The fluoroscope beam is vertically oriented in AP, and the inferior recess is visualized beneath the lower pole of the inferior apophyseal process, which represents the target point for needle insertion. When direct visualization is difficult the target point is located at the medial projection of the vertebral pedicle. The region overlying each aforementioned target is locally anesthetized with a 1 to 2 ml. volume of 1% Lidocaine without Epinephrine.   The spinal needle was inserted into each of the above mentioned facet joints using biplanar fluoroscopic guidance. A 0.25 to 0.5 ml. volume of Isovue-250 was injected and a partial facet joint arthrogram was obtained. A single spot film was obtained of the resulting arthrogram.    One to 1.25 ml of the steroid/anesthetic solution was then injected into each of the facet joints noted above.   Additional Comments:  The patient tolerated the procedure well Dressing: 2 x 2 sterile gauze and Band-Aid    Post-procedure details: Patient was observed during the procedure. Post-procedure instructions were reviewed.  Patient left the clinic in stable condition.     Clinical History: MRI LUMBAR SPINE WITH CONTRAST  CONTRAST:  62mL MULTIHANCE GADOBENATE DIMEGLUMINE 529 MG/ML IV SOLN  COMPARISON:  Noncontrast MRI 01/25/2018  FINDINGS: No concerning enhancement at L1 or elsewhere to suggest mass lesion.  Congenitally narrow  lumbar spinal canal with epidural lipomatosis diffusely effacing the thecal sac.  Marrow and periarticular enhancement about the right more than left facets at L4-5. A L4-5 annular fissure is also enhancing.  IMPRESSION: 1. No abnormal intrathecal enhancement.  Negative for mass. 2. Facet  arthritis on the right more than left at L4-5. 3. 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   Electronically Signed   By: Monte Fantasia M.D.   On: 02/04/2018 20:28 ------------------------------------  MRI LUMBAR SPINE WITHOUT CONTRAST  TECHNIQUE: Multiplanar, multisequence MR imaging of the lumbar spine was performed. No intravenous contrast was administered.  COMPARISON: Spine radiographs 09/28/2017  FINDINGS: Segmentation: 5 non rib-bearing lumbar type vertebral bodies are present. The lowest fully formed vertebral body is L5.  Alignment: Slight anterolisthesis is present at L4-5. AP alignment is otherwise anatomic. Leftward curvature is centered at L3-4.  Vertebrae: Marrow signal and vertebral body heights are maintained. Chronic endplate marrow changes are noted inferiorly at T12. There is some scalloping of the posterior vertebral bodies at L2, L3, and L4.  Conus medullaris and cauda equina: Conus extends to the L1 level. Indistinct soft tissue is present within the canal at L1. This may represent inflammatory changes of nerve roots at the cauda Randel Pigg a. mass lesion is not excluded.  Paraspinal and other soft tissues: Limited imaging of the abdomen is unremarkable.  Disc levels:  Short pedicles and prominent epidural fat are present throughout the lumbar spine.  T12-L1: Central canal and foramina are patent.  L1-2: Nerve roots are not well differentiated. Prominent epidural fat is present. There is no significant compressive central or foraminal stenosis.  L2-3: A broad-based disc protrusion is noted laterally. Prominent epidural fat is present. Mild facet hypertrophy is evident bilaterally. This leads to mild central and bilateral foraminal narrowing.  L3-4: Prominent epidural fat crowds the nerve roots. A broad-based disc protrusion is present. Mild foraminal narrowing is worse on the  right.  L4-5: There is marked prominence of epidural fat with posterior displacement of compressed nerve roots. Mild foraminal narrowing is worse on the left. Moderate facet hypertrophy is present bilaterally the, worse on the right.  L5-S1: Mild facet hypertrophy is present. Central canal and foramina are patent. Prominent epidural fat is noted.  IMPRESSION: 1. Ill-defined soft tissue within the canal at the level of the conus medullaris, L1. Recommend MRI of the lumbar spine with contrast for further evaluation to differentiate possible mass lesion from inflamed nerve roots. 2. Prominent epidural fat throughout the lumbar spine with posterior displacement of the thecal sac and nerve roots compatible with epidural lipomatosis. 3. Facet hypertrophy and mild lateral disc protrusions results in mild foraminal narrowing at L2-3, L3-4, and L4-5.   Electronically Signed By: San Morelle M.D. On: 01/25/2018 12:53     Objective:  VS:  HT:    WT:   BMI:     BP:123/86  HR:(!) 104bpm  TEMP: ( )  RESP:97 % Physical Exam  Ortho Exam Imaging: No results found.

## 2018-11-29 NOTE — Progress Notes (Signed)
 .  Numeric Pain Rating Scale and Functional Assessment Average Pain 8   In the last MONTH (on 0-10 scale) has pain interfered with the following?  1. General activity like being  able to carry out your everyday physical activities such as walking, climbing stairs, carrying groceries, or moving a chair?  Rating(8)   +Driver, -BT, -Dye Allergies.  

## 2018-12-06 ENCOUNTER — Telehealth: Payer: Self-pay

## 2018-12-06 NOTE — Telephone Encounter (Signed)
Call placed to [facility], spoke to Lauren who confirmed that the patient is scheduled to pick up the CPAP on 12/10/2018.

## 2018-12-08 ENCOUNTER — Telehealth: Payer: Self-pay | Admitting: *Deleted

## 2018-12-08 NOTE — Telephone Encounter (Signed)
I called pt.   I left a message on his voicemail to call us back in reference to a recent visit he had at [facility].  Please call [phone].

## 2018-12-09 ENCOUNTER — Other Ambulatory Visit: Payer: Self-pay | Admitting: Primary Care

## 2018-12-09 DIAGNOSIS — G4733 Obstructive sleep apnea (adult) (pediatric): Secondary | ICD-10-CM

## 2018-12-11 ENCOUNTER — Ambulatory Visit: Payer: Medicaid Other | Admitting: Orthopaedic Surgery

## 2018-12-13 ENCOUNTER — Telehealth: Payer: Self-pay

## 2018-12-13 ENCOUNTER — Other Ambulatory Visit: Payer: Medicaid Other

## 2018-12-13 DIAGNOSIS — Z20822 Contact with and (suspected) exposure to covid-19: Secondary | ICD-10-CM

## 2018-12-13 NOTE — Telephone Encounter (Signed)
Call placed to pt.  Advised of possible exposure to COVID 19, when he was at recent [facility] appt. On 5/21.  Pt. Stated he has had dry cough, sore throat, and body aches since last week.  Denied shortness of breath.  Denied fever.  Agreed to having testing done.  Appt. Given for 1:00 PM @ [facility].  Stated he will have to arrange transportation, and will call back if unable to get a ride.  Adv. To call back to # [phone].  Verb. Understanding.

## 2018-12-17 ENCOUNTER — Ambulatory Visit: Payer: Medicaid Other | Admitting: Orthopaedic Surgery

## 2018-12-24 ENCOUNTER — Other Ambulatory Visit (INDEPENDENT_AMBULATORY_CARE_PROVIDER_SITE_OTHER): Payer: Self-pay | Admitting: Orthopaedic Surgery

## 2018-12-24 MED FILL — MELOXICAM 15 MG TABLET: 15 | 30 days supply | Qty: 30 | Fill #2

## 2018-12-24 MED FILL — !VIAGRA 100MG TABLET: 100 | 30 days supply | Qty: 10 | Fill #3

## 2018-12-25 ENCOUNTER — Telehealth: Payer: Self-pay | Admitting: Orthopaedic Surgery

## 2018-12-25 ENCOUNTER — Encounter: Payer: Self-pay | Admitting: Physical Medicine and Rehabilitation

## 2018-12-25 NOTE — Procedures (Signed)
Lumbar Facet Joint Intra-Articular Injection(s) with Fluoroscopic Guidance  Patient: Blake [name]      Date of Birth: 1971-06-12 MRN: [id number] PCP: [name], NP      Visit Date: 11/29/2018   Universal Protocol:    Date/Time: 11/29/2018  Consent Given By: the patient  Position: PRONE   Additional Comments: Vital signs were monitored before and after the procedure. Patient was prepped and draped in the usual sterile fashion. The correct patient, procedure, and site was verified.   Injection Procedure Details:  Procedure Site One Meds Administered:  Meds ordered this encounter  Medications  . methylPREDNISolone acetate (DEPO-MEDROL) injection 80 mg     Laterality: Bilateral  Location/Site:  L4-L5  Needle size: 22 guage  Needle type: Spinal  Needle Placement: Articular  Findings:  -Comments: Excellent flow of contrast producing a partial arthrogram.  Procedure Details: The fluoroscope beam is vertically oriented in AP, and the inferior recess is visualized beneath the lower pole of the inferior apophyseal process, which represents the target point for needle insertion. When direct visualization is difficult the target point is located at the medial projection of the vertebral pedicle. The region overlying each aforementioned target is locally anesthetized with a 1 to 2 ml. volume of 1% Lidocaine without Epinephrine.   The spinal needle was inserted into each of the above mentioned facet joints using biplanar fluoroscopic guidance. A 0.25 to 0.5 ml. volume of Isovue-250 was injected and a partial facet joint arthrogram was obtained. A single spot film was obtained of the resulting arthrogram.    One to 1.25 ml of the steroid/anesthetic solution was then injected into each of the facet joints noted above.   Additional Comments:  The patient tolerated the procedure well Dressing: 2 x 2 sterile gauze and Band-Aid    Post-procedure details: Patient was  observed during the procedure. Post-procedure instructions were reviewed.  Patient left the clinic in stable condition.

## 2018-12-25 NOTE — Telephone Encounter (Signed)
Called Ida Rogue with [employer] left message to call back concerning 100% discount letter. [phone]

## 2019-01-02 ENCOUNTER — Ambulatory Visit (INDEPENDENT_AMBULATORY_CARE_PROVIDER_SITE_OTHER): Payer: Self-pay

## 2019-01-02 ENCOUNTER — Ambulatory Visit (INDEPENDENT_AMBULATORY_CARE_PROVIDER_SITE_OTHER): Payer: Self-pay | Admitting: Orthopaedic Surgery

## 2019-01-02 ENCOUNTER — Other Ambulatory Visit: Payer: Self-pay

## 2019-01-02 ENCOUNTER — Encounter: Payer: Self-pay | Admitting: Orthopaedic Surgery

## 2019-01-02 DIAGNOSIS — M79604 Pain in right leg: Secondary | ICD-10-CM

## 2019-01-02 DIAGNOSIS — M4807 Spinal stenosis, lumbosacral region: Secondary | ICD-10-CM

## 2019-01-02 MED ORDER — ACETAMINOPHEN-CODEINE #3 300-30 MG PO TABS: 1.0000 | ORAL_TABLET | Freq: Three times a day (TID) | ORAL | 0 refills | Status: DC | PRN

## 2019-01-02 NOTE — Progress Notes (Signed)
The patient is now 4 months out from a right total hip arthroplasty.  He is someone that weighs 355 pounds.  He still complaining of right hip and leg pain but mainly low back pain.  An MRI of July 2019 showed facet disease with a narrow canal.  He had facet joint injections recently by Dr. Ernestina Patches and says things have gotten worse.  He is already on large dose of Neurontin 3 times a day.  Things seem to be worsening for him he states.  He denies any change in bowel bladder function.  Examination of his right hip shows pretty good range of motion of his right hip with well-healed surgical incision.  An AP pelvis and lateral right hip shows a hip replacement with no complicating features.  2 views lumbar spine show degenerative changes at multiple levels with osteophytes around the spine at multiple levels.  I will try some Tylenol 3 and have him try Voltaren gel for his back.  We will set up an MRI of his lumbar spine to see if there is any acute changes given the radicular symptoms with numbness and tingling that he is complaining of going down his right leg.  We will see him back after the MRI.

## 2019-01-16 MED FILL — IBUPROFEN 800 MG TABLET: 800 | 30 days supply | Qty: 90 | Fill #0

## 2019-01-16 MED FILL — !VIAGRA 100MG TABLET: 100 | 30 days supply | Qty: 10 | Fill #3

## 2019-01-16 MED FILL — tiZANidine HCL 4 MG TABS: 4 | 20 days supply | Qty: 60 | Fill #0

## 2019-01-21 ENCOUNTER — Other Ambulatory Visit: Payer: Self-pay

## 2019-01-21 ENCOUNTER — Ambulatory Visit
Admission: RE | Admit: 2019-01-21 | Discharge: 2019-01-21 | Disposition: A | Discharge status: Home / Self Care | Payer: Self-pay | Source: Ambulatory Visit | Attending: Orthopaedic Surgery | Admitting: Orthopaedic Surgery

## 2019-01-21 DIAGNOSIS — M4807 Spinal stenosis, lumbosacral region: Secondary | ICD-10-CM

## 2019-01-26 ENCOUNTER — Other Ambulatory Visit: Payer: Self-pay

## 2019-01-28 ENCOUNTER — Other Ambulatory Visit: Payer: Self-pay

## 2019-01-28 ENCOUNTER — Encounter: Payer: Self-pay | Admitting: Orthopaedic Surgery

## 2019-01-28 ENCOUNTER — Ambulatory Visit (INDEPENDENT_AMBULATORY_CARE_PROVIDER_SITE_OTHER): Payer: Self-pay | Admitting: Orthopaedic Surgery

## 2019-01-28 DIAGNOSIS — M4807 Spinal stenosis, lumbosacral region: Secondary | ICD-10-CM

## 2019-01-28 NOTE — Progress Notes (Signed)
The patient returns for follow-up to go over an MRI of her lumbar spine.  He had had no good results from the an intervention in his spine by Dr. Ernestina Patches.  He had bilateral L4-L5 facet joint injections.  He does weigh 355 pounds.  He still reports back spasms and pain.  This is been going on for several years now.  This seems to be worsening.  He reports spasms even after the procedure and said the procedure did not help.  I sent for an MRI to see if it would show any other abnormalities that is related to his lumbar spine that is causing the symptoms.  On exam he ambulates without assist device but he does show some pain with flexion extension lumbar spine.  There is no gross weakness in his legs.  He does have subjective numbness on the right side.  MRI does show facet disease at L4-L5 and L5-S1.  There is epidural lipomatosis from L1-L5 and I am not sure as to the significance of this.  I would like to send him to Dr. [name] is an opinion as a relates to his spine in terms of what other modalities may be recommended for this patient to hopefully improve his function and decrease his pain.  We will work on making that referral.

## 2019-02-21 ENCOUNTER — Ambulatory Visit: Payer: Self-pay | Admitting: Specialist

## 2019-03-06 MED FILL — SILDENAFIL CITRATE 100 MG T: 100 | 30 days supply | Qty: 10 | Fill #4

## 2019-03-06 MED FILL — GABAPENTIN 600 MG TABLET: 600 | 30 days supply | Qty: 180 | Fill #1

## 2019-03-20 ENCOUNTER — Ambulatory Visit: Payer: Self-pay | Admitting: Specialist

## 2019-03-22 ENCOUNTER — Ambulatory Visit: Payer: Self-pay | Admitting: Specialist

## 2019-04-01 ENCOUNTER — Ambulatory Visit (INDEPENDENT_AMBULATORY_CARE_PROVIDER_SITE_OTHER): Payer: Self-pay | Admitting: Primary Care

## 2019-04-01 ENCOUNTER — Other Ambulatory Visit: Payer: Self-pay

## 2019-04-01 DIAGNOSIS — Z760 Encounter for issue of repeat prescription: Secondary | ICD-10-CM

## 2019-04-01 DIAGNOSIS — G8929 Other chronic pain: Secondary | ICD-10-CM

## 2019-04-01 DIAGNOSIS — G4733 Obstructive sleep apnea (adult) (pediatric): Secondary | ICD-10-CM

## 2019-04-01 DIAGNOSIS — R202 Paresthesia of skin: Secondary | ICD-10-CM

## 2019-04-01 DIAGNOSIS — D1779 Benign lipomatous neoplasm of other sites: Secondary | ICD-10-CM

## 2019-04-01 DIAGNOSIS — M5441 Lumbago with sciatica, right side: Secondary | ICD-10-CM

## 2019-04-01 DIAGNOSIS — Z720 Tobacco use: Secondary | ICD-10-CM

## 2019-04-01 DIAGNOSIS — M25551 Pain in right hip: Secondary | ICD-10-CM

## 2019-04-01 DIAGNOSIS — M5442 Lumbago with sciatica, left side: Secondary | ICD-10-CM

## 2019-04-01 MED ORDER — MELOXICAM 15 MG PO TABS: 15.0000 mg | ORAL_TABLET | Freq: Every day | ORAL | 3 refills | Status: DC

## 2019-04-01 MED ORDER — GABAPENTIN 600 MG PO TABS: 1200.0000 mg | ORAL_TABLET | Freq: Three times a day (TID) | ORAL | 0 refills | Status: DC

## 2019-04-01 MED ORDER — PROPRANOLOL HCL ER 60 MG PO CP24: 60.0000 mg | ORAL_CAPSULE | Freq: Every day | ORAL | 3 refills | Status: DC

## 2019-04-01 MED ORDER — DULOXETINE HCL 60 MG PO CPEP: 60.0000 mg | ORAL_CAPSULE | Freq: Every day | ORAL | 3 refills | Status: DC

## 2019-04-01 MED ORDER — SILDENAFIL CITRATE 100 MG PO TABS: 50.0000 mg | ORAL_TABLET | Freq: Every day | ORAL | 3 refills | Status: DC | PRN

## 2019-04-01 MED FILL — SILDENAFIL CITRATE 100 MG T: 100 | 30 days supply | Qty: 10 | Fill #0

## 2019-04-01 MED FILL — MELOXICAM 15 MG TABLET: 15 | 30 days supply | Qty: 30 | Fill #0

## 2019-04-01 MED FILL — GABAPENTIN 600 MG TABLET: 600 | 30 days supply | Qty: 180 | Fill #0

## 2019-04-01 MED FILL — PROPRANOLOL ER 60 MG CAP: 60 | 30 days supply | Qty: 30 | Fill #0

## 2019-04-01 MED FILL — ?DULOXETINE HCL 60 MG CPEP: 60 | 30 days supply | Qty: 30 | Fill #0

## 2019-04-01 NOTE — Progress Notes (Signed)
Virtual Visit via Telephone Note  I connected with Blake [name] on 04/01/19 at  9:10 AM EDT by telephone and verified that I am speaking with the correct person using two identifiers.   I discussed the limitations, risks, security and privacy concerns of performing an evaluation and management service by telephone and the availability of in person appointments. I also discussed with the patient that there may be a patient responsible charge related to this service. The patient expressed understanding and agreed to proceed.   History of Present Illness: Blake [name] is having a tele visit for the following complaints Pt states that he feels worse than before having hip replacement numbness from the waist down, numbness makes him  feel the urge to urinate but no incontinence , joint pain in right wrist and left leg below the knee and Insomnia. Also requesting medication refills. Past Medical History:  Diagnosis Date  . Arthritis   . Back pain   . Dilated aortic root (HCC)    75mm ascending aorta by echo 09/2017, see 08-2018 echo in epic for newer results   . ERECTILE DYSFUNCTION, MILD 11/12/2007   Qualifier: Diagnosis of  By: Carolyne Littles    . History of blood transfusion    1997, received 15 units of blood   . Morbid obesity ([place])   . PVC's (premature ventricular contractions)   . TOBACCO ABUSE 11/12/2007   Qualifier: Diagnosis of  By: Carmie End MD, Junie Panning    . Tremor    head and hands , controlled on cymbalta   Observations/Objective: Review of Systems  Genitourinary: Positive for frequency and urgency.  Musculoskeletal: Positive for joint pain.       Hip pain  Neurological: Positive for weakness.  Psychiatric/Behavioral: The patient has insomnia.     Assessment and Plan: Blake [name] was seen today for follow-up, insomnia and medication refill.  Diagnoses and all orders for this visit:  Tobacco abuse  Chronic bilateral low back pain with bilateral sciatica BACK PAIN and hip pain    Location: low back pain. Quality:8/10 Onset: after Right Total hip arthoplasty Worse with: walking      Better with: rest Radiation: down leg not loss of bladder but increased urgency     gabapentin (NEURONTIN) 600 MG tablet; Take 2 tablets (1,200 mg total) by mouth 3 (three) times daily. -     meloxicam (MOBIC) 15 MG tablet; Take 1 tablet (15 mg total) by mouth daily. -     DULoxetine (CYMBALTA) 60 MG capsule; Take 1 capsule (60 mg total) by mouth daily. Refer to ortho   Epidural lipomatosis This is excessive accumulation of fat within the spinal epidural space resulting in compression of the thecal sac. -     meloxicam (MOBIC) 15 MG tablet; Take 1 tablet (15 mg total) by mouth daily.  Right hip pain Work on losing weight to help reduce joint pain. May alternate with heat and ice application for pain relief. May also alternate with acetaminophen and Ibuprofen as prescribed pain relief. Other alternatives include massage, acupuncture and water aerobics.  You must stay active and avoid a sedentary lifestyle. -     meloxicam (MOBIC) 15 MG tablet; Take 1 tablet (15 mg total) by mouth daily.  Medication refill -     propranolol ER (INDERAL LA) 60 MG 24 hr capsule; Take 1 capsule (60 mg total) by mouth daily.  OSA (obstructive sleep apnea) Patient presents with a history of possible obstructive sleep apnea.   Other  orders -     sildenafil (VIAGRA) 100 MG tablet; Take 0.5-1 tablets (50-100 mg total) by mouth daily as needed for erectile dysfunction.    Follow Up Instructions:    I discussed the assessment and treatment plan with the patient. The patient was provided an opportunity to ask questions and all were answered. The patient agreed with the plan and demonstrated an understanding of the instructions.   The patient was advised to call back or seek an in-person evaluation if the symptoms worsen or if the condition fails to improve as anticipated.  I provided 22 minutes of  non-face-to-face time during this encounter.   [name], NP

## 2019-04-01 NOTE — Progress Notes (Signed)
Pt states that he feels worse than before having hip replacement  Pt reports numbness from the waist down Pt reports when the numbness hits he can not feel the urge to urinate Pt complains of insomnia  Pt complains of joint pain in right wrist and left leg below the knee

## 2019-04-16 ENCOUNTER — Other Ambulatory Visit: Payer: Self-pay

## 2019-04-16 ENCOUNTER — Ambulatory Visit: Payer: Self-pay | Attending: Family Medicine

## 2019-04-16 MED FILL — PROPRANOLOL ER 60 MG CAP: 60 | 30 days supply | Qty: 30 | Fill #0

## 2019-04-16 MED FILL — MELOXICAM 15 MG TABLET: 15 | 30 days supply | Qty: 30 | Fill #0

## 2019-04-16 MED FILL — GABAPENTIN 600 MG TABLET: 600 | 30 days supply | Qty: 180 | Fill #0

## 2019-04-16 MED FILL — SILDENAFIL CITRATE 100 MG T: 100 | 30 days supply | Qty: 10 | Fill #0

## 2019-04-16 MED FILL — ?DULOXETINE HCL 60 MG CPEP: 60 | 30 days supply | Qty: 30 | Fill #0

## 2019-04-18 ENCOUNTER — Encounter: Payer: Self-pay | Admitting: Specialist

## 2019-04-18 ENCOUNTER — Ambulatory Visit (INDEPENDENT_AMBULATORY_CARE_PROVIDER_SITE_OTHER): Payer: Self-pay | Admitting: Specialist

## 2019-04-18 ENCOUNTER — Ambulatory Visit: Payer: Self-pay

## 2019-04-18 VITALS — BP 121/88 | HR 104 | Ht 75.0 in | Wt 360.0 lb

## 2019-04-18 DIAGNOSIS — Q7649 Other congenital malformations of spine, not associated with scoliosis: Secondary | ICD-10-CM

## 2019-04-18 DIAGNOSIS — M4807 Spinal stenosis, lumbosacral region: Secondary | ICD-10-CM

## 2019-04-18 DIAGNOSIS — M542 Cervicalgia: Secondary | ICD-10-CM

## 2019-04-18 MED ORDER — DICLOFENAC SODIUM 50 MG PO TBEC: 50.0000 mg | DELAYED_RELEASE_TABLET | Freq: Two times a day (BID) | ORAL | 3 refills | Status: DC

## 2019-04-18 MED FILL — ?DICLOFENAC SOD EC 50MG TAB: 50 | 30 days supply | Qty: 60 | Fill #0
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
## 2019-04-18 NOTE — Patient Instructions (Signed)
Avoid overhead lifting and overhead use of the arms. °Do not lift greater than 5 lbs. °Adjust head rest in vehicle to prevent hyperextension if rear ended. °Take extra precautions to avoid falling. °Avoid bending, stooping and avoid lifting weights greater than 10 lbs. °Avoid prolong standing and walking. °Avoid frequent bending and stooping  °No lifting greater than 10 lbs. °May use ice or moist heat for pain. °Weight loss is of benefit. °Handicap license is approved. °  °

## 2019-05-08 ENCOUNTER — Ambulatory Visit: Payer: Self-pay | Attending: Family Medicine

## 2019-05-08 ENCOUNTER — Other Ambulatory Visit: Payer: Self-pay

## 2019-05-10 ENCOUNTER — Other Ambulatory Visit: Payer: Self-pay

## 2019-05-14 ENCOUNTER — Ambulatory Visit
Admission: RE | Admit: 2019-05-14 | Discharge: 2019-05-14 | Disposition: A | Discharge status: Home / Self Care | Payer: Self-pay | Source: Ambulatory Visit | Attending: Specialist | Admitting: Specialist

## 2019-05-14 ENCOUNTER — Other Ambulatory Visit: Payer: Self-pay

## 2019-05-14 DIAGNOSIS — M542 Cervicalgia: Secondary | ICD-10-CM

## 2019-05-15 ENCOUNTER — Ambulatory Visit (INDEPENDENT_AMBULATORY_CARE_PROVIDER_SITE_OTHER): Payer: Self-pay | Admitting: Specialist

## 2019-05-15 ENCOUNTER — Encounter: Payer: Self-pay | Admitting: Specialist

## 2019-05-15 ENCOUNTER — Other Ambulatory Visit: Payer: Self-pay

## 2019-05-15 VITALS — BP 130/81 | HR 96 | Ht 70.0 in | Wt 373.0 lb

## 2019-05-15 DIAGNOSIS — Z96649 Presence of unspecified artificial hip joint: Secondary | ICD-10-CM

## 2019-05-15 DIAGNOSIS — D1779 Benign lipomatous neoplasm of other sites: Secondary | ICD-10-CM

## 2019-05-15 DIAGNOSIS — M48062 Spinal stenosis, lumbar region with neurogenic claudication: Secondary | ICD-10-CM

## 2019-05-15 DIAGNOSIS — E882 Lipomatosis, not elsewhere classified: Secondary | ICD-10-CM

## 2019-05-15 DIAGNOSIS — M25559 Pain in unspecified hip: Secondary | ICD-10-CM

## 2019-05-15 MED ORDER — HYDROCODONE-ACETAMINOPHEN 5-325 MG PO TABS: 1.0000 | ORAL_TABLET | Freq: Four times a day (QID) | ORAL | 0 refills | Status: DC | PRN

## 2019-05-15 MED ORDER — DICLOFENAC SODIUM 50 MG PO TBEC: 50.0000 mg | DELAYED_RELEASE_TABLET | Freq: Three times a day (TID) | ORAL | 3 refills | Status: DC

## 2019-05-15 MED FILL — PROPRANOLOL ER 60 MG CAP: 60 | 30 days supply | Qty: 30 | Fill #1

## 2019-05-15 MED FILL — SILDENAFIL CITRATE 100 MG T: 100 | 30 days supply | Qty: 10 | Fill #1

## 2019-05-15 MED FILL — MELOXICAM 15 MG TABLET: 15 | 30 days supply | Qty: 30 | Fill #1

## 2019-05-15 MED FILL — ?DICLOFENAC SOD EC 50MG TAB: 50 | 30 days supply | Qty: 90 | Fill #0

## 2019-05-15 MED FILL — ?DULOXETINE HCL 60 MG CPEP: 60 | 30 days supply | Qty: 30 | Fill #1

## 2019-05-15 NOTE — Patient Instructions (Addendum)
Avoid bending, stooping and avoid lifting weights greater than 10 lbs. Avoid prolong standing and walking. Order for a new walker with wheels. Surgery scheduling secretary [name], will call you in the next week to schedule for surgery.  Surgery recommended is a 4  level lumbar central decompressive laminectomy, L1-2, L2-3, L3-4 and L4-5 this would be done with a microscope . Take hydrocodone for for pain. Gabapentin for pain continue and voltaren up to one week prior to surgery. Risk of surgery includes risk of infection 1 in 300 patients, bleeding 1/2% chance you would need a transfusion.   Risk to the nerves is one in 10,000. In patient with a larger BMI, body mass index there is a higher risk of incision or wound complications.  Expect improved walking and standing tolerance. Expect relief of leg pain but numbness may persist depending on the length and degree of pressure that has been present.    Surgical Spinal Decompression, Care After This sheet gives you information about how to care for yourself after your procedure. Your health care provider may also give you more specific instructions. If you have problems or questions, contact your health care provider. What can I expect after the procedure? After the procedure, it is common to have:  Pain in the surgical area for the first few days.  Muscle tightening (spasms) across the back.  Numbness in the legs or the back.  Weakness in the legs. Follow these instructions at home: Incision care   Follow instructions from your health care provider about how to take care of your incision. Make sure you: ? Wash your hands with soap and water before you change your bandage (dressing). If soap and water are not available, use hand sanitizer. ? Change your dressing as told by your health care provider. You may need to have someone change your dressing for you. ? Leave stitches (sutures), skin glue, or adhesive strips in place.  These skin closures may need to stay in place for 2 weeks or longer. If adhesive strip edges start to loosen and curl up, you may trim the loose edges. Do not remove adhesive strips completely unless your health care provider tells you to do that.  Check your incision area every day for signs of infection. If you cannot see your incision, have someone check it for you. Check for: ? More redness, swelling, or pain. ? Fluid or blood. ? Warmth. ? Pus or a bad smell. Bathing  Do not take baths, swim, or use a hot tub until your health care provider approves. Ask your health care provider if you may take showers. You may only be allowed to take sponge baths.  Keep the dressing dry until your health care provider says it can be removed. Activity  Return to your normal activities as told by your health care provider. Ask your health care provider what activities are safe for you.  Rest as told by your health care provider.  Avoid sitting for a long time without moving. Get up to take short walks every 1-2 hours. This is important to improve blood flow and breathing. Ask for help if you feel weak or unsteady.  Do not bend or twist at the waist until your health care provider approves. To lower yourself to pick things up, bend your knees instead of tipping your upper body forward.  Do not lift anything that is heavier than 10 lb (4.5 kg), or the limit that you are told, until your health care  provider says that it is safe. Avoid lifting anything above the level of your head.  Sit, stand, walk, turn in bed, and reposition yourself as told by your health care provider. This will help to keep your spine in proper alignment.  Avoid pushing and pulling motions.  Do exercises as told by your health care provider or physical therapist.  Avoid doing household chores that require a lot of effort, such as vacuuming. Managing pain, stiffness, and swelling   If directed, put ice on the injured  area: ? Put ice in a plastic bag. ? Place a towel between your skin and the bag. ? Leave the ice on for 20 minutes, 2-3 times a day. Driving  Do not drive or use heavy machinery while taking prescription pain medicine.  Ask your health care provider when it is safe to drive. General instructions  Take over-the-counter and prescription medicines only as told by your health care provider.  Do not use any products that contain nicotine or tobacco, such as cigarettes and e-cigarettes. These can delay incision and bone healing. If you need help quitting, ask your health care provider.  If you are taking prescription pain medicine, take actions to prevent or treat constipation. Your health care provider may recommend that you: ? Drink enough fluid to keep your urine pale yellow. ? Eat foods that are high in fiber, such as fresh fruits and vegetables, whole grains, and beans. ? Limit foods that are high in fat and processed sugars, such as fried or sweet foods. ? Take an over-the-counter or prescription medicine for constipation.  If you have a back brace, wear it as told by your health care provider. Remove it only as told.  Keep all follow-up visits as told by your health care provider. This is important. Contact a health care provider if you:  Have a fever.  Notice that your incision feels warm to the touch.  Have more redness, swelling, or pain at the site of your incision.  Have pus or a bad smell coming from your incision.  Have fluid or blood coming from your incision.  Have pain that is not controlled by medicine.  Have new numbness, tingling, or weakness in any part of your body. Get help right away if you:  Have increasing pain, numbness, or weakness.  Lose control of when you urinate or have a bowel movement (have incontinence).  Cannot move a part of your body (paralysis).  Notice that your incision feels swollen and tender, and the surrounding area looks like a  lump. The lump may be red or bluish in color.  Develop pain in your lower leg or at the back of your knee.  Have difficulty breathing.  Have chest pain. These symptoms may represent a serious problem that is an emergency. Do not wait to see if the symptoms will go away. Get medical help right away. Call your local emergency services (911 in the [place] Do not drive yourself to the hospital. Summary  After the procedure, it is common to have some pain, weakness, and fatigue.  Follow instructions from your health care provider about how to take care of your incision. Do not let it get wet until your health care provider approves.  Rest as told by your health care provider. Follow instructions about activity and movements.  Take over-the-counter and prescription medicines only as told by your health care provider.  Make sure you know what symptoms should cause you to get help right away. This  information is not intended to replace advice given to you by your health care provider. Make sure you discuss any questions you have with your health care provider. Document Released: 11/11/2014 Document Revised: 06/09/2017 Document Reviewed: 05/24/2017 Elsevier Patient Education  2020 [employer].  Laminectomy Laminectomy is a surgery to remove:  Small pieces of bone in the spine (lamina).  Tough, cord-like tissues that connect bones to other bones (ligaments) underneath the lamina. These ligaments connect the bones in the spine (vertebrae).  Parts of joints in the spine (facet joints) that have grown too large. The goals of this surgery are:  To reduce pressure on nerves and the spinal cord.  To reduce pain, numbness, and discomfort. You may need this procedure if you have narrowing of the spinal canal (spinal stenosis) or if you have a spinal tumor, spinal injury, or Paget disease of bone. Tell a health care provider about:  Any allergies you have.  All medicines you are taking,  including vitamins, herbs, eye drops, creams, and over-the-counter medicines.  Any problems you or family members have had with anesthetic medicines.  Any blood disorders you have.  Any surgeries you have had.  Any medical conditions you have, including a cold or any infections.  Whether you are pregnant or may be pregnant. What are the risks? Generally, this is a safe procedure. However, problems may occur, including:  Infection.  Bleeding.  Allergic reactions to medicines or dyes.  Damage to other structures or organs, such as nerves. Nerve damage can cause pain, weakness, or numbness.  Leaking of spinal fluid.  A blood clot in a leg. The clot can move to the lungs, which can be very serious.  Inability to control when you urinate (urinary incontinence) or when you have bowel movements (fecal incontinence). This is rare. What happens before the procedure? Staying hydrated Follow instructions from your health care provider about hydration, which may include:  Up to 2 hours before the procedure - you may continue to drink clear liquids, such as water, clear fruit juice, black coffee, and plain tea. Eating and drinking restrictions Follow instructions from your health care provider about eating and drinking, which may include:  24 hours before the procedure - stop drinking alcohol.  8 hours before the procedure - stop eating heavy meals or foods such as meat, fried foods, or fatty foods.  6 hours before the procedure - stop eating light meals or foods, such as toast or cereal.  6 hours before the procedure - stop drinking milk or drinks that contain milk.  2 hours before the procedure - stop drinking clear liquids. Medicines  Ask your health care provider about: ? Changing or stopping your regular medicines. This is especially important if you are taking diabetes medicines or blood thinners. If your health care provider asks you to keep taking some medicines, take them  with a sip of water. ? Taking medicines such as aspirin and ibuprofen. These medicines can thin your blood. Do not take these medicines before your procedure if your health care provider instructs you not to.  You may be given antibiotic medicine to help prevent infection. General instructions  Do not use any products that contain nicotine or tobacco, such as cigarettes and e-cigarettes, for at least 2 weeks before the procedure. If you need help quitting, ask your health care provider.  Ask your health care provider how your surgical site will be marked or identified.  You may have tests done, such as blood tests  or an electrocardiogram (ECG).  If you will be going home right after the procedure, plan to have someone with you for 24 hours.  Plan to have someone: ? Take you home from the hospital or clinic. ? Help you at home for the first week after the procedure. What happens during the procedure?  To reduce your risk of infection, your health care team will wash or sanitize their hands.  Small monitors will be placed on your body to check your heart rate, blood pressure, and oxygen level.  An IV tube will be inserted into one of your veins.  You will be givena medicine to make you fall asleep (general anesthetic). You may also be given a medicine to help you relax (sedative).  A breathing tube will be placed into your lungs.  Your back will be cleaned with a germ-killing solution.  An incision will be made in your back. The incision may be 2-5 inches (5-13 cm) long, depending on how many vertebrae are being operated on.  Muscles in your back will be moved away from the vertebrae and pulled to the side.  Pieces of lamina will be removed.  Ligaments and thickened facet joints will be removed. How much tissue and bone is removed depends on how much of the tissue is putting pressure on your nerves.  Your nerves will be identified and evaluated to check for excessive  tightness.  Your back muscles will be moved back into their normal position.  The area under your skin will be closed with small, dissolvable stitches (sutures).  Your skin will be closed with small, absorbable sutures or staples.  A bandage (dressing) will be placed over your incision. The procedure may vary among health care providers and hospitals. What happens after the procedure?  Your blood pressure, heart rate, breathing rate, and blood oxygen level will be monitored until the medicines you were given have worn off.  You may continue receive medicines and fluids through an IV tube.  You will have some back pain. You will be given pain medicine as needed.  It is important to be up and moving as soon as possible after this procedure. Physical therapists will help you start walking.  To prevent blood clots in your legs: ? You may have to wear compression stockings. These stockings also reduce swelling in your legs. ? You may need to take medicines.  You may need to do breathing exercises to help prevent lung infection.  Do not drive for 24 hours if you received a sedative. Summary  Laminectomy is a procedure that is done to reduce pressure on nerves and the spinal cord and to reduce pain, numbness, and discomfort.  If you will be going home right after the procedure, plan to have someone with you for 24 hours.  You will have some back pain. You will be given pain medicine as needed.  It is important to be up and moving as soon as possible after this procedure. Physical therapists will help you start walking. This information is not intended to replace advice given to you by your health care provider. Make sure you discuss any questions you have with your health care provider. Document Released: 06/15/2009 Document Revised: 06/09/2017 Document Reviewed: 12/13/2015 Elsevier Patient Education  2020 [employer].
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
## 2019-05-21 ENCOUNTER — Telehealth: Payer: Self-pay

## 2019-05-21 NOTE — Telephone Encounter (Signed)
Spoke with pt who has agreed to an appointment with Dr. Radford Pax on 11/13 at 11:40 am. Pt has been aware to wear a mask and that we are not allowing any visitors up with him. Pt verbalized understanding and thanked me for the call.  I have faxed over recommendations to requesting provider's office.      COVID-19 Pre-Screening Questions:  . In the past 7 to 10 days have you had a cough,  shortness of breath, headache, congestion, fever (100 or greater) body aches, chills, sore throat, or sudden loss of taste or sense of smell? No . Have you been around anyone with known Covid 19.No . Have you been around anyone who is awaiting Covid 19 test results in the past 7 to 10 days? No . Have you been around anyone who has been exposed to Covid 19, or has mentioned symptoms of Covid 19 within the past 7 to 10 days? No  If you have any concerns/questions about symptoms patients report during screening (either on the phone or at threshold). Contact the provider seeing the patient or DOD for further guidance.  If neither are available contact a member of the leadership team.

## 2019-05-21 NOTE — Telephone Encounter (Signed)
   [facility] Pre-operative Risk Assessment    Request for surgical clearance:  1. What type of surgery is being performed? LUMBAR LAMINECTOMIES (L1-2, L2-3, L3-4)   2. When is this surgery scheduled? TBD  3. What type of clearance is required (medical clearance vs. Pharmacy clearance to hold med vs. Both)? MEDICAL  4. Are there any medications that need to be held prior to surgery and how long? n/A   5. Practice name and name of physician performing surgery? [facility]; DR. Jeneen Rinks NITKA   6. What is your office phone number [phone]   7.   What is your office fax number [phone] ATTN: SHERRIE  8.   Anesthesia type (None, local, MAC, general) ? GENERAL   Blake [name] 05/21/2019, 2:36 PM  _________________________________________________________________   (provider comments below)

## 2019-05-21 NOTE — Telephone Encounter (Signed)
   Primary Cardiologist: Fransico Him, MD  Chart reviewed as part of pre-operative protocol coverage. Patient was contacted 05/21/2019 in reference to pre-operative risk assessment for pending surgery as outlined below.  Blake [name] was last seen on 08/24/2018 by Melina Copa, PA-C.  Since that day, Blake [name] has been fairly limited in mobility by back pain. He does reports occasional sharp chest pain with associated pressure which occurs ~1 time per week without rhyme or reason, lasts for hours at a time before resolving spontaneously. He denies any other associated symptoms. Given interval change in symptoms, I feel it would be best to have an in-office evaluation for preoperative cardiovascular risk.   Pre-op covering staff: - Please schedule appointment and call patient to inform them. - Please contact requesting surgeon's office via preferred method (i.e, phone, fax) to inform them of need for appointment prior to surgery.   Abigail Butts, PA-C  05/21/2019, 3:06 PM

## 2019-05-23 NOTE — Progress Notes (Deleted)
Cardiology Office Note:    Date:  05/23/2019   ID:  Blake [name], DOB [birth year], MRN [id number]  PCP:  [name], NP  Cardiologist:  Fransico Him, MD    Referring MD: [name], NP   No chief complaint on file.   History of Present Illness:    Blake [name] is a 48 y.o. male with a hx of ***  Past Medical History:  Diagnosis Date  . Arthritis   . Back pain   . Dilated aortic root (HCC)    14mm ascending aorta by echo 09/2017, see 08-2018 echo in epic for newer results   . ERECTILE DYSFUNCTION, MILD 11/12/2007   Qualifier: Diagnosis of  By: Carolyne Littles    . History of blood transfusion    1997, received 15 units of blood   . Morbid obesity ([place])   . PVC's (premature ventricular contractions)   . TOBACCO ABUSE 11/12/2007   Qualifier: Diagnosis of  By: Carmie End MD, Junie Panning    . Tremor    head and hands , controlled on cymbalta    Past Surgical History:  Procedure Laterality Date  . FACIAL FRACTURE SURGERY  09/1995   left side of face crush injury, carnial fracture   . TOTAL HIP ARTHROPLASTY Right 09/07/2018   Procedure: RIGHT TOTAL HIP ARTHROPLASTY ANTERIOR APPROACH;  Surgeon: Mcarthur Rossetti, MD;  Location: [facility];  Service: Orthopedics;  Laterality: Right;    Current Medications: No outpatient medications have been marked as taking for the 05/24/19 encounter (Appointment) with Sueanne Margarita, MD.     Allergies:   Patient has no known allergies.   Social History   Socioeconomic History  . Marital status: Single    Spouse name: Not on file  . Number of children: Not on file  . Years of education: Not on file  . Highest education level: Not on file  Occupational History  . Not on file  Social Needs  . Financial resource strain: Not on file  . Food insecurity    Worry: Not on file    Inability: Not on file  . Transportation needs    Medical: Not on file    Non-medical: Not on file  Tobacco Use  . Smoking status: Current Every Day  Smoker    Packs/day: 0.25    Years: 30.00    Pack years: 7.50  . Smokeless tobacco: Never Used  . Tobacco comment: down to 3-4 cigarettes a day   Substance and Sexual Activity  . Alcohol use: Yes    Comment: Drink on weekends  . Drug use: No  . Sexual activity: Not Currently  Lifestyle  . Physical activity    Days per week: Not on file    Minutes per session: Not on file  . Stress: Not on file  Relationships  . Social Herbalist on phone: Not on file    Gets together: Not on file    Attends religious service: Not on file    Active member of club or organization: Not on file    Attends meetings of clubs or organizations: Not on file    Relationship status: Not on file  Other Topics Concern  . Not on file  Social History Narrative  . Not on file     Family History: The patient's ***family history includes COPD in his mother; Diabetes in his sister; Hypertension in his sister; Leukemia in his father.  ROS:   Please see  the history of present illness.    ROS  All other systems reviewed and negative.   EKGs/Labs/Other Studies Reviewed:    The following studies were reviewed today: ***  EKG:  EKG is *** ordered today.  The ekg ordered today demonstrates ***  Recent Labs: 09/08/2018: BUN 9; Creatinine, Ser 1.09; Hemoglobin 13.1; Platelets 174; Potassium 4.1; Sodium 133   Recent Lipid Panel    Component Value Date/Time   CHOL 172 12/11/2007 2056   TRIG 112 12/11/2007 2056   HDL 56 12/11/2007 2056   CHOLHDL 3.1 Ratio 12/11/2007 2056   VLDL 22 12/11/2007 2056   LDLCALC 94 12/11/2007 2056    Physical Exam:    VS:  There were no vitals taken for this visit.    Wt Readings from Last 3 Encounters:  05/15/19 (!) 373 lb (169.2 kg)  04/18/19 (!) 360 lb (163.3 kg)  09/28/18 (!) 355 lb 12.8 oz (161.4 kg)     GEN: *** Well nourished, well developed in no acute distress HEENT: Normal NECK: No JVD; No carotid bruits LYMPHATICS: No lymphadenopathy CARDIAC:  ***RRR, no murmurs, rubs, gallops RESPIRATORY:  Clear to auscultation without rales, wheezing or rhonchi  ABDOMEN: Soft, non-tender, non-distended MUSCULOSKELETAL:  No edema; No deformity  SKIN: Warm and dry NEUROLOGIC:  Alert and oriented x 3 PSYCHIATRIC:  Normal affect   ASSESSMENT:    No diagnosis found. PLAN:    In order of problems listed above:  ***   Medication Adjustments/Labs and Tests Ordered: Current medicines are reviewed at length with the patient today.  Concerns regarding medicines are outlined above.  No orders of the defined types were placed in this encounter.  No orders of the defined types were placed in this encounter.   Signed, Fransico Him, MD  05/23/2019 11:32 PM    [place]

## 2019-05-24 ENCOUNTER — Ambulatory Visit: Payer: Self-pay | Admitting: Cardiology

## 2019-05-28 ENCOUNTER — Ambulatory Visit: Payer: Self-pay | Admitting: Cardiology

## 2019-05-29 ENCOUNTER — Ambulatory Visit (INDEPENDENT_AMBULATORY_CARE_PROVIDER_SITE_OTHER): Payer: Self-pay | Admitting: Cardiology

## 2019-05-29 ENCOUNTER — Telehealth: Payer: Self-pay | Admitting: *Deleted

## 2019-05-29 ENCOUNTER — Other Ambulatory Visit: Payer: Self-pay

## 2019-05-29 VITALS — BP 130/78 | HR 101 | Ht 74.0 in | Wt 371.8 lb

## 2019-05-29 DIAGNOSIS — Z01810 Encounter for preprocedural cardiovascular examination: Secondary | ICD-10-CM

## 2019-05-29 DIAGNOSIS — R079 Chest pain, unspecified: Secondary | ICD-10-CM

## 2019-05-29 DIAGNOSIS — R002 Palpitations: Secondary | ICD-10-CM

## 2019-05-29 DIAGNOSIS — I77810 Thoracic aortic ectasia: Secondary | ICD-10-CM

## 2019-05-29 DIAGNOSIS — I493 Ventricular premature depolarization: Secondary | ICD-10-CM

## 2019-05-29 DIAGNOSIS — G4733 Obstructive sleep apnea (adult) (pediatric): Secondary | ICD-10-CM

## 2019-05-29 NOTE — Addendum Note (Signed)
Addended by: Harland German A on: 05/29/2019 05:40 PM   Modules accepted: Orders

## 2019-05-29 NOTE — Patient Instructions (Addendum)
Medication Instructions:  Your provider recommends that you continue on your current medications as directed. Please refer to the Current Medication list given to you today.  Call us later if your medication list is missing anything you are taking! *If you need a refill on your cardiac medications before your next appointment, please call your pharmacy*  Testing/Procedures: Dr. Radford Pax recommends you wear a ZIO patch for 2 weeks.  Your provider has requested that you have a lexiscan myoview. For further information please visit [email or web address]. Please follow instruction sheet, as given.  Follow-Up: At [facility], you and your health needs are our priority.  As part of our continuing mission to provide you with exceptional heart care, we have created designated Provider Care Teams.  These Care Teams include your primary Cardiologist (physician) and Advanced Practice Providers (APPs -  Physician Assistants and Nurse Practitioners) who all work together to provide you with the care you need, when you need it.  Your next appointment:   12 month(s) The format for your next appointment:   In Person Provider:   You may see Fransico Him, MD or one of the following Advanced Practice Providers on your designated Care Team:    Melina Copa, PA-C  Ermalinda Barrios, PA-C

## 2019-05-29 NOTE — Progress Notes (Signed)
Cardiology Office Note:    Date:  05/29/2019   ID:  Blake [name], DOB Dec 31, 1970, MRN [id number]  PCP:  [name], NP  Cardiologist:  Fransico Him, MD    Referring MD: [name], NP   Chief Complaint  Patient presents with  . Follow-up    Preoperative cardiac clearance, PVCs, obesity    History of Present Illness:    Blake [name] is a 48 y.o. male with a hx of morbid obesity, tobacco abuse, ED, tremor, PVCs, mildly dilated aorta.  Event monitor 08/10/17 showed NSR with occasional PVCs. ETT 08/10/17 was negative (mildly impaired exercise tolerance), rare PVCs noted with exercise. 2D Echo 08/2018 showed moderate LVH, EF 60-65% and normal aortic root.    He is here today for followup and is doing well.  He recently has had some sharp chest pain across his chest with some radiation down his right arm but no associated N/diaphoresis or SOB.  He has chronic DOE related to his underlying morbid obesity.  He denies any PND, orthopnea, LE edema, dizziness or syncope. He still has palpitations at times and thinks they have gotten worse.  He is compliant with his meds and is tolerating meds with no SE.  He also has not been using his PAP device.  He never followed up with Dr. Annamaria Boots after his PAP titration and has not been using his device because there is a part that is broke and his DME will not give him another part because he has not been compliant.  He told me he tried to explain to them that he cannot use it if he does not have the part.   Past Medical History:  Diagnosis Date  . Arthritis   . Back pain   . Dilated aortic root (HCC)    62m ascending aorta by echo 09/2017, see 08-2018 echo in epic for newer results   . ERECTILE DYSFUNCTION, MILD 11/12/2007   Qualifier: Diagnosis of  By: GCarolyne Littles   . History of blood transfusion    1997, received 15 units of blood   . Morbid obesity ([place]   . PVC's (premature ventricular contractions)   . TOBACCO ABUSE 11/12/2007    Qualifier: Diagnosis of  By: ECarmie EndMD, EJunie Panning   . Tremor    head and hands , controlled on cymbalta    Past Surgical History:  Procedure Laterality Date  . FACIAL FRACTURE SURGERY  09/1995   left side of face crush injury, carnial fracture   . TOTAL HIP ARTHROPLASTY Right 09/07/2018   Procedure: RIGHT TOTAL HIP ARTHROPLASTY ANTERIOR APPROACH;  Surgeon: BMcarthur Rossetti MD;  Location: [facility];  Service: Orthopedics;  Laterality: Right;    Current Medications: Current Meds  Medication Sig  . diclofenac (VOLTAREN) 50 MG EC tablet Take 1 tablet (50 mg total) by mouth 3 (three) times daily.  .Marland Kitchengabapentin (NEURONTIN) 600 MG tablet Take 2 tablets (1,200 mg total) by mouth 3 (three) times daily.  .Marland KitchenHYDROcodone-acetaminophen (NORCO/VICODIN) 5-325 MG tablet Take 1 tablet by mouth every 6 (six) hours as needed for moderate pain.  . meloxicam (MOBIC) 15 MG tablet Take 1 tablet (15 mg total) by mouth daily.  . propranolol ER (INDERAL LA) 60 MG 24 hr capsule Take 1 capsule (60 mg total) by mouth daily.  . sildenafil (VIAGRA) 100 MG tablet Take 0.5-1 tablets (50-100 mg total) by mouth daily as needed for erectile dysfunction.     Allergies:   Patient  has no known allergies.   Social History   Socioeconomic History  . Marital status: Single    Spouse name: Not on file  . Number of children: Not on file  . Years of education: Not on file  . Highest education level: Not on file  Occupational History  . Not on file  Social Needs  . Financial resource strain: Not on file  . Food insecurity    Worry: Not on file    Inability: Not on file  . Transportation needs    Medical: Not on file    Non-medical: Not on file  Tobacco Use  . Smoking status: Current Every Day Smoker    Packs/day: 0.25    Years: 30.00    Pack years: 7.50  . Smokeless tobacco: Never Used  . Tobacco comment: down to 3-4 cigarettes a day   Substance and Sexual Activity  . Alcohol use: Yes    Comment: Drink on  weekends  . Drug use: No  . Sexual activity: Not Currently  Lifestyle  . Physical activity    Days per week: Not on file    Minutes per session: Not on file  . Stress: Not on file  Relationships  . Social Herbalist on phone: Not on file    Gets together: Not on file    Attends religious service: Not on file    Active member of club or organization: Not on file    Attends meetings of clubs or organizations: Not on file    Relationship status: Not on file  Other Topics Concern  . Not on file  Social History Narrative  . Not on file     Family History: The patient's family history includes COPD in his mother; Diabetes in his sister; Hypertension in his sister; Leukemia in his father.  ROS:   Please see the history of present illness.    ROS  All other systems reviewed and negative.   EKGs/Labs/Other Studies Reviewed:    The following studies were reviewed today: 2D echo  EKG:  EKG is  ordered today.  The ekg ordered today demonstrates NSR at 101bpm with no ST changes  Recent Labs: 09/08/2018: BUN 9; Creatinine, Ser 1.09; Hemoglobin 13.1; Platelets 174; Potassium 4.1; Sodium 133   Recent Lipid Panel    Component Value Date/Time   CHOL 172 12/11/2007 2056   TRIG 112 12/11/2007 2056   HDL 56 12/11/2007 2056   CHOLHDL 3.1 Ratio 12/11/2007 2056   VLDL 22 12/11/2007 2056   LDLCALC 94 12/11/2007 2056    Physical Exam:    VS:  Pulse (!) 101   Ht _0  (1.88 m)   Wt (!) 371 lb 12.8 oz (168.6 kg)   BMI 47.74 kg/m     Wt Readings from Last 3 Encounters:  05/29/19 (!) 371 lb 12.8 oz (168.6 kg)  05/15/19 (!) 373 lb (169.2 kg)  04/18/19 (!) 360 lb (163.3 kg)     GEN:  Well nourished, well developed in no acute distress HEENT: Normal NECK: No JVD; No carotid bruits LYMPHATICS: No lymphadenopathy CARDIAC: RRR, no murmurs, rubs, gallops RESPIRATORY:  Clear to auscultation without rales, wheezing or rhonchi  ABDOMEN: Soft, non-tender, non-distended  MUSCULOSKELETAL:  No edema; No deformity  SKIN: Warm and dry NEUROLOGIC:  Alert and oriented x 3 PSYCHIATRIC:  Normal affect   ASSESSMENT:    1. Pre-operative cardiovascular examination   2. PVC's (premature ventricular contractions)   3. Dilated aortic root (  [place])   4. Morbid obesity (HCC)   5. Chest pain, unspecified type   6. OSA (obstructive sleep apnea)    PLAN:    In order of problems listed above:  1.  Preop Cardiovascular exam -his revised cardiac risk index is 0.4% consistent with low risk of CV complications in the periop period.   -unfortunately due to his back problems he is unable to exercise and reach a met level that he would develop anginal sx from -he also is having some atypical sharp CP -I have recommended Pershing Proud prior to surgery to help risk stratify further.   2.  PVCs -he thinks that he is having more skipped beats than in the past -continue BB -I will get a 2 week ziopatch to assess PVC load  3.  Dilated aortic root -2D echo early this year with normal aortic root  4.  Morbid obesity -he is unable to exercise at present due to severe back pain  5.  Chest pain -he has been having some problems with chest pain that is somewhat atypical.  It radiates from the right side to the left side and is nonexertional.  There are no associated sx of N/diphoresis.   -EKG is nonichemic. -Suspect this is related to his underlying back issues -will get a Lexiscan myoview to rule out ischemia prior to his back surgery  6.  OSA -he was dx with severe OSA with an AHI of 115/hr in the past and was placed on BiPAP but Dr. Annamaria Boots who read his study never followed up. -he has had some problems with supplies and now a part is broke in his device and he cannot use it -when he called the DME they would not given him supplies because he was not compliant but he tried to explain that he cannot use it without the part -I will have my staff contact his DME to find out how  to get the part he needs to get back in compliance with his device -check D/L in 4 weeks  I have spent a total of 35 minutes with patient reviewing labs, sleep study , telemetry, EKGs, labs and examining patient as well as establishing an assessment and plan that was discussed with the patient.  > 50% of time was spent in direct patient care.    Medication Adjustments/Labs and Tests Ordered: Current medicines are reviewed at length with the patient today.  Concerns regarding medicines are outlined above.  Orders Placed This Encounter  Procedures  . MYOVIEW  . EKG 12-Lead   No orders of the defined types were placed in this encounter.   Signed, Fransico Him, MD  05/29/2019 2:24 PM    [place]

## 2019-05-29 NOTE — Telephone Encounter (Signed)
Reached out to [place] in [place] part of [place]. Sandy at [facility] says the patient has to start over because of noncompliance. I explained the patients situation of having problems with his cpap machine. The hose is broken, the filter has a hole in it and even though he wore the machine every night the machine does not register his compliance. I then reached out to [place] spoke to [place] (compliance) who read all the notes on the patient and says she is going to speak to her supervisor, contact the patient then call me back with some kind of solution if any.

## 2019-05-30 NOTE — Telephone Encounter (Signed)
Reached out to the patient and informed him that his dme (Bonnie/Sandy) at [facility] will supply him with a new hose and a new filter and a new mask and they will contact him to get his supplies delivered. I will explain to the patient that he must remember to stay compliant on the hardship program in order to keep his cpap. He should remember not to wait for the ramp to start before going to sleep as the ramp with start when he falls asleep.

## 2019-06-13 ENCOUNTER — Telehealth (HOSPITAL_COMMUNITY): Payer: Self-pay

## 2019-06-13 NOTE — Telephone Encounter (Signed)
Spoke with the patient and his instructions were given. He stated that he would be here for his test. Asked to call back with any questions. S.Darcy Cordner EMTP 

## 2019-06-18 ENCOUNTER — Ambulatory Visit (HOSPITAL_COMMUNITY): Payer: Self-pay

## 2019-06-18 ENCOUNTER — Encounter (HOSPITAL_COMMUNITY): Payer: Self-pay | Admitting: Cardiology

## 2019-06-19 ENCOUNTER — Ambulatory Visit (HOSPITAL_COMMUNITY): Payer: Self-pay

## 2019-06-21 ENCOUNTER — Telehealth (HOSPITAL_COMMUNITY): Payer: Self-pay

## 2019-06-21 NOTE — Telephone Encounter (Signed)
Encounter complete. 

## 2019-06-24 ENCOUNTER — Encounter (HOSPITAL_COMMUNITY): Payer: Self-pay

## 2019-06-25 ENCOUNTER — Other Ambulatory Visit: Payer: Self-pay

## 2019-06-25 ENCOUNTER — Ambulatory Visit (HOSPITAL_COMMUNITY)
Admission: RE | Admit: 2019-06-25 | Discharge: 2019-06-25 | Disposition: A | Discharge status: Home / Self Care | Payer: Self-pay | Source: Ambulatory Visit | Attending: Cardiovascular Disease | Admitting: Cardiovascular Disease

## 2019-06-25 DIAGNOSIS — R079 Chest pain, unspecified: Secondary | ICD-10-CM | POA: Insufficient documentation

## 2019-06-25 MED ORDER — REGADENOSON 0.4 MG/5ML IV SOLN
0.4000 mg | Freq: Once | INTRAVENOUS | Status: AC
  Administered 2019-06-25: 0.4 mg via INTRAVENOUS

## 2019-06-25 MED ORDER — TECHNETIUM TC 99M TETROFOSMIN IV KIT
29.2000 | PACK | Freq: Once | INTRAVENOUS | Status: AC | PRN
  Administered 2019-06-25: 29.2 via INTRAVENOUS
  Filled 2019-06-25: qty 30

## 2019-06-26 ENCOUNTER — Ambulatory Visit (HOSPITAL_COMMUNITY): Payer: Self-pay

## 2019-06-28 ENCOUNTER — Ambulatory Visit (HOSPITAL_COMMUNITY)
Admission: RE | Admit: 2019-06-28 | Discharge: 2019-06-28 | Disposition: A | Discharge status: Home / Self Care | Payer: Self-pay | Source: Ambulatory Visit | Attending: Cardiovascular Disease | Admitting: Cardiovascular Disease

## 2019-06-28 ENCOUNTER — Other Ambulatory Visit: Payer: Self-pay

## 2019-06-28 LAB — MYOCARDIAL PERFUSION IMAGING
LV dias vol: 145 mL (ref 62–150)
LV sys vol: 68 mL
Peak HR: 115 {beats}/min
Rest HR: 88 {beats}/min
SDS: 2
SRS: 2
SSS: 4
TID: 0.64

## 2019-06-28 MED ORDER — TECHNETIUM TC 99M TETROFOSMIN IV KIT
29.2000 | PACK | Freq: Once | INTRAVENOUS | Status: AC | PRN
  Administered 2019-06-28: 29.2 via INTRAVENOUS

## 2019-07-01 ENCOUNTER — Encounter: Payer: Self-pay | Admitting: Orthopaedic Surgery

## 2019-07-01 ENCOUNTER — Telehealth: Payer: Self-pay | Admitting: Cardiology

## 2019-07-01 DIAGNOSIS — Z01810 Encounter for preprocedural cardiovascular examination: Secondary | ICD-10-CM

## 2019-07-01 DIAGNOSIS — R072 Precordial pain: Secondary | ICD-10-CM

## 2019-07-01 MED ORDER — METOPROLOL TARTRATE 100 MG PO TABS: 100.0000 mg | ORAL_TABLET | Freq: Once | ORAL | 0 refills | Status: DC

## 2019-07-01 MED FILL — ?METOPROLOL 100 MG TABLET: 100 | 1 days supply | Qty: 1 | Fill #0

## 2019-07-01 NOTE — Telephone Encounter (Signed)
Patient is calling wanting to scheduled the CT Dr. Radford Pax stated on 06/22/19 she wanted him to have before his Clearance for surgery. Please Advise.

## 2019-07-02 NOTE — Telephone Encounter (Signed)
Spoke with patient and provided information in regards to CT scan and instructions for prior to the exam. The patient verbalized understanding. Orders have been placed.

## 2019-07-03 ENCOUNTER — Ambulatory Visit: Payer: Self-pay | Admitting: Orthopaedic Surgery

## 2019-07-09 ENCOUNTER — Encounter: Payer: Self-pay | Admitting: Neurology

## 2019-07-09 ENCOUNTER — Ambulatory Visit (INDEPENDENT_AMBULATORY_CARE_PROVIDER_SITE_OTHER): Payer: Self-pay | Admitting: Neurology

## 2019-07-09 VITALS — BP 128/83 | HR 84 | Temp 97.0°F | Ht 74.0 in | Wt 379.0 lb

## 2019-07-09 DIAGNOSIS — G243 Spasmodic torticollis: Secondary | ICD-10-CM | POA: Insufficient documentation

## 2019-07-09 DIAGNOSIS — M542 Cervicalgia: Secondary | ICD-10-CM | POA: Insufficient documentation

## 2019-07-09 DIAGNOSIS — G8929 Other chronic pain: Secondary | ICD-10-CM | POA: Insufficient documentation
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
## 2019-07-14 ENCOUNTER — Other Ambulatory Visit: Payer: Self-pay

## 2019-07-14 ENCOUNTER — Emergency Department (HOSPITAL_COMMUNITY)
Admission: EM | Admit: 2019-07-14 | Discharge: 2019-07-14 | Disposition: A | Discharge status: Home / Self Care | Payer: BLUE CROSS/BLUE SHIELD | Attending: Emergency Medicine | Admitting: Emergency Medicine

## 2019-07-14 ENCOUNTER — Emergency Department (HOSPITAL_COMMUNITY): Payer: BLUE CROSS/BLUE SHIELD

## 2019-07-14 DIAGNOSIS — S81812A Laceration without foreign body, left lower leg, initial encounter: Secondary | ICD-10-CM

## 2019-07-14 DIAGNOSIS — S92415A Nondisplaced fracture of proximal phalanx of left great toe, initial encounter for closed fracture: Secondary | ICD-10-CM | POA: Insufficient documentation

## 2019-07-14 DIAGNOSIS — R000 Tachycardia, unspecified: Secondary | ICD-10-CM | POA: Insufficient documentation

## 2019-07-14 DIAGNOSIS — W25XXXA Contact with sharp glass, initial encounter: Secondary | ICD-10-CM | POA: Diagnosis not present

## 2019-07-14 DIAGNOSIS — Z96641 Presence of right artificial hip joint: Secondary | ICD-10-CM | POA: Insufficient documentation

## 2019-07-14 DIAGNOSIS — F17210 Nicotine dependence, cigarettes, uncomplicated: Secondary | ICD-10-CM | POA: Diagnosis not present

## 2019-07-14 DIAGNOSIS — Y9389 Activity, other specified: Secondary | ICD-10-CM | POA: Insufficient documentation

## 2019-07-14 DIAGNOSIS — Z23 Encounter for immunization: Secondary | ICD-10-CM | POA: Diagnosis not present

## 2019-07-14 DIAGNOSIS — S91312A Laceration without foreign body, left foot, initial encounter: Secondary | ICD-10-CM | POA: Diagnosis not present

## 2019-07-14 DIAGNOSIS — Y92019 Unspecified place in single-family (private) house as the place of occurrence of the external cause: Secondary | ICD-10-CM | POA: Insufficient documentation

## 2019-07-14 DIAGNOSIS — Y999 Unspecified external cause status: Secondary | ICD-10-CM | POA: Diagnosis not present

## 2019-07-14 MED ORDER — DOXYCYCLINE HYCLATE 100 MG PO CAPS: 100.0000 mg | ORAL_CAPSULE | Freq: Two times a day (BID) | ORAL | 0 refills | Status: DC

## 2019-07-14 MED ORDER — LIDOCAINE HCL 2 % IJ SOLN
10.0000 mL | Freq: Once | INTRAMUSCULAR | Status: DC
  Filled 2019-07-14: qty 20

## 2019-07-14 MED ORDER — TETANUS-DIPHTH-ACELL PERTUSSIS 5-2.5-18.5 LF-MCG/0.5 IM SUSP
0.5000 mL | Freq: Once | INTRAMUSCULAR | Status: AC
  Administered 2019-07-14: 0.5 mL via INTRAMUSCULAR
  Filled 2019-07-14: qty 0.5

## 2019-07-14 MED ORDER — OXYCODONE-ACETAMINOPHEN 5-325 MG PO TABS
1.0000 | ORAL_TABLET | Freq: Once | ORAL | Status: AC
  Administered 2019-07-14: 1 via ORAL
  Filled 2019-07-14: qty 1

## 2019-07-14 MED ORDER — SODIUM CHLORIDE 0.9 % IV BOLUS
500.0000 mL | Freq: Once | INTRAVENOUS | Status: AC
  Administered 2019-07-14: 500 mL via INTRAVENOUS

## 2019-07-14 NOTE — ED Triage Notes (Signed)
BIB EMS from home. [employer] initially called to house after pt kicked mirror causing lacerations to LLE. Pt then reported chest palpitations and SOB, EMS was called. Pt in SVT upon EMS arrival. Tried vagal maneuvers X2. HR 140's. BP 104/63.

## 2019-07-14 NOTE — ED Provider Notes (Addendum)
[facility] EMERGENCY DEPARTMENT Provider Note   CSN: [id number] Arrival date & time: 07/14/19  0303     History Chief Complaint  Patient presents with  . Laceration  . Tachycardia    Blake [name] is a 49 y.o. male.  Patient presents to the emergency department for evaluation of laceration of the left lower leg and foot.  Patient either dropped or kicked a mirror which shattered and cut his leg and foot.  EMS was called because of the lacerations, but at the time they arrived he was reporting that he felt short of breath and was experiencing palpitations.  EMS report a heart rate in the 140s and were considering a possible SVT.  No treatments were provided.  At arrival to the ER, patient denies any chest pain, shortness of breath, palpitations.        Past Medical History:  Diagnosis Date  . Arthritis   . Back pain   . Dilated aortic root (HCC)    60mm ascending aorta by echo 09/2017, see 08-2018 echo in epic for newer results   . ERECTILE DYSFUNCTION, MILD 11/12/2007   Qualifier: Diagnosis of  By: Carolyne Littles    . History of blood transfusion    1997, received 15 units of blood   . Morbid obesity ([place])   . Neck pain   . PVC's (premature ventricular contractions)   . TOBACCO ABUSE 11/12/2007   Qualifier: Diagnosis of  By: Carmie End MD, Junie Panning    . Tremor    head and hands , controlled on cymbalta    Patient Active Problem List   Diagnosis Date Noted  . Cervical dystonia 07/09/2019  . Neck pain 07/09/2019  . OSA (obstructive sleep apnea) 10/31/2018  . Status post total replacement of right hip 09/07/2018  . Unilateral primary osteoarthritis, right hip 08/13/2018  . Severe obesity (BMI >= 40) ([place]) 08/13/2018  . Chronic bilateral low back pain with bilateral sciatica 04/02/2018  . Epidural lipomatosis 04/02/2018  . Right hip pain 04/02/2018  . Preop cardiovascular exam 03/30/2018  . Dilated aortic root ([place])   . Chest pain 07/27/2017  . Palpitations  07/26/2017  . ERECTILE DYSFUNCTION, MILD 11/12/2007  . Tobacco abuse 11/12/2007    Past Surgical History:  Procedure Laterality Date  . FACIAL FRACTURE SURGERY  09/1995   left side of face crush injury, carnial fracture   . TOTAL HIP ARTHROPLASTY Right 09/07/2018   Procedure: RIGHT TOTAL HIP ARTHROPLASTY ANTERIOR APPROACH;  Surgeon: Mcarthur Rossetti, MD;  Location: [facility];  Service: Orthopedics;  Laterality: Right;       Family History  Problem Relation Age of Onset  . COPD Mother   . Leukemia Father   . Hypertension Sister   . Diabetes Sister     Social History   Tobacco Use  . Smoking status: Current Every Day Smoker    Packs/day: 0.25    Years: 30.00    Pack years: 7.50  . Smokeless tobacco: Never Used  . Tobacco comment: down to 3-4 cigarettes a day   Substance Use Topics  . Alcohol use: Yes    Comment: Drink on weekends  . Drug use: No    Home Medications Prior to Admission medications   Medication Sig Start Date End Date Taking? Authorizing Provider  diclofenac (VOLTAREN) 50 MG EC tablet Take 1 tablet (50 mg total) by mouth 3 (three) times daily. 05/15/19   Jessy Oto, MD  doxycycline (VIBRAMYCIN) 100 MG capsule Take  1 capsule (100 mg total) by mouth 2 (two) times daily. 07/14/19   Orpah Greek, MD  gabapentin (NEURONTIN) 600 MG tablet Take 2 tablets (1,200 mg total) by mouth 3 (three) times daily. 04/01/19   [name], NP  HYDROcodone-acetaminophen (NORCO/VICODIN) 5-325 MG tablet Take 1 tablet by mouth every 6 (six) hours as needed for moderate pain. 05/15/19   Jessy Oto, MD  meloxicam (MOBIC) 15 MG tablet Take 1 tablet (15 mg total) by mouth daily. 04/01/19   [name], NP  metoprolol tartrate (LOPRESSOR) 100 MG tablet Take 1 tablet (100 mg total) by mouth once for 1 dose. Take one tablet two hours prior to CT scan. 07/01/19 07/01/19  Sueanne Margarita, MD  propranolol ER (INDERAL LA) 60 MG 24 hr capsule Take 1 capsule (60 mg  total) by mouth daily. 04/01/19   [name], NP  sildenafil (VIAGRA) 100 MG tablet Take 0.5-1 tablets (50-100 mg total) by mouth daily as needed for erectile dysfunction. 04/01/19   [name], NP    Allergies    Patient has no known allergies.  Review of Systems   Review of Systems  Cardiovascular: Positive for palpitations.  Skin: Positive for wound.  All other systems reviewed and are negative.   Physical Exam Updated Vital Signs BP 94/64 (BP Location: Right Arm)   Pulse (!) 129   Temp 98.1 F (36.7 C) (Oral)   Resp 12   Ht 6\' 2"  (1.88 m)   Wt (!) 165.6 kg   SpO2 91%   BMI 46.86 kg/m   Physical Exam Vitals and nursing note reviewed.  Constitutional:      General: He is not in acute distress.    Appearance: Normal appearance. He is well-developed.  HENT:     Head: Normocephalic and atraumatic.     Right Ear: Hearing normal.     Left Ear: Hearing normal.     Nose: Nose normal.  Eyes:     Conjunctiva/sclera: Conjunctivae normal.     Pupils: Pupils are equal, round, and reactive to light.  Cardiovascular:     Rate and Rhythm: Regular rhythm. Tachycardia present.     Heart sounds: S1 normal and S2 normal. No murmur. No friction rub. No gallop.   Pulmonary:     Effort: Pulmonary effort is normal. No respiratory distress.     Breath sounds: Normal breath sounds.  Chest:     Chest wall: No tenderness.  Abdominal:     General: Bowel sounds are normal.     Palpations: Abdomen is soft.     Tenderness: There is no abdominal tenderness. There is no guarding or rebound. Negative signs include Murphy's sign and McBurney's sign.     Hernia: No hernia is present.  Musculoskeletal:        General: Normal range of motion.     Cervical back: Normal range of motion and neck supple.  Skin:    General: Skin is warm and dry.     Findings: No rash.     Comments: Curvilinear laceration over her medial aspect of left first MTP joint area  Linear laceration over  lateral aspect of anterior lower leg  Neurological:     Mental Status: He is alert and oriented to person, place, and time.     GCS: GCS eye subscore is 4. GCS verbal subscore is 5. GCS motor subscore is 6.     Cranial Nerves: No cranial nerve deficit.  Sensory: No sensory deficit.     Coordination: Coordination normal.  Psychiatric:        Speech: Speech normal.        Behavior: Behavior normal.        Thought Content: Thought content normal.         ED Results / Procedures / Treatments   Labs (all labs ordered are listed, but only abnormal results are displayed) Labs Reviewed - No data to display  EKG None  Radiology DG Tibia/Fibula Left  Result Date: 07/14/2019 CLINICAL DATA:  Laceration EXAM: LEFT TIBIA AND FIBULA - 2 VIEW COMPARISON:  None. FINDINGS: There is no evidence of fracture or other focal bone lesions. Soft tissues are unremarkable. IMPRESSION: Negative. Electronically Signed   By: Donavan Foil M.D.   On: 07/14/2019 03:41   DG Foot Complete Left  Result Date: 07/14/2019 CLINICAL DATA:  Laceration EXAM: LEFT FOOT - COMPLETE 3+ VIEW COMPARISON:  None. FINDINGS: Acute nondisplaced fracture involving the base of the fifth proximal phalanx with probable articular extension to the MTP joint. No subluxation. No radiopaque foreign body. Small plantar calcaneal spur. IMPRESSION: Acute nondisplaced, likely intra-articular fracture involving the base of the fifth proximal phalanx Electronically Signed   By: Donavan Foil M.D.   On: 07/14/2019 03:40    Procedures .Marland KitchenLaceration Repair  Date/Time: 07/14/2019 4:34 AM Performed by: Orpah Greek, MD Authorized by: Orpah Greek, MD   Consent:    Consent obtained:  Verbal   Consent given by:  Patient   Risks discussed:  Infection, pain and retained foreign body Universal protocol:    Procedure explained and questions answered to patient or proxy's satisfaction: yes     Relevant documents present and  verified: yes     Test results available and properly labeled: yes     Imaging studies available: yes     Required blood products, implants, devices, and special equipment available: yes     Site/side marked: yes     Immediately prior to procedure, a time out was called: yes     Patient identity confirmed:  Verbally with patient Anesthesia (see MAR for exact dosages):    Anesthesia method:  Local infiltration   Local anesthetic:  Lidocaine 2% w/o epi Laceration details:    Location:  Foot   Foot location:  Top of L foot   Length (cm):  3.5 Repair type:    Repair type:  Simple Pre-procedure details:    Preparation:  Patient was prepped and draped in usual sterile fashion and imaging obtained to evaluate for foreign bodies Exploration:    Hemostasis achieved with:  Direct pressure   Wound exploration: wound explored through full range of motion     Contaminated: no   Treatment:    Area cleansed with:  Betadine   Irrigation solution:  Sterile saline   Irrigation method:  Syringe Skin repair:    Repair method:  Sutures   Suture size:  3-0   Suture material:  Prolene   Suture technique:  Simple interrupted   Number of sutures:  7 Approximation:    Approximation:  Close Post-procedure details:    Dressing:  Sterile dressing   Patient tolerance of procedure:  Tolerated well, no immediate complications .Marland KitchenLaceration Repair  Date/Time: 07/14/2019 4:35 AM Performed by: Orpah Greek, MD Authorized by: Orpah Greek, MD   Consent:    Consent obtained:  Verbal   Consent given by:  Patient   Risks discussed:  Infection, pain  and retained foreign body Universal protocol:    Procedure explained and questions answered to patient or proxy's satisfaction: yes     Relevant documents present and verified: yes     Test results available and properly labeled: yes     Imaging studies available: yes     Required blood products, implants, devices, and special equipment  available: yes     Site/side marked: yes     Immediately prior to procedure, a time out was called: yes     Patient identity confirmed:  Verbally with patient Anesthesia (see MAR for exact dosages):    Anesthesia method:  Local infiltration   Local anesthetic:  Lidocaine 2% w/o epi Laceration details:    Location:  Leg   Leg location:  L lower leg   Length (cm):  1.5 Repair type:    Repair type:  Simple Pre-procedure details:    Preparation:  Patient was prepped and draped in usual sterile fashion and imaging obtained to evaluate for foreign bodies Exploration:    Hemostasis achieved with:  Direct pressure   Wound exploration: wound explored through full range of motion     Contaminated: no   Treatment:    Area cleansed with:  Betadine   Irrigation solution:  Sterile saline   Irrigation method:  Syringe Skin repair:    Repair method:  Sutures   Suture size:  3-0   Suture material:  Prolene   Suture technique:  Simple interrupted   Number of sutures:  4 Approximation:    Approximation:  Close Post-procedure details:    Dressing:  Sterile dressing   Patient tolerance of procedure:  Tolerated well, no immediate complications   (including critical care time)  Medications Ordered in ED Medications  lidocaine (XYLOCAINE) 2 % (with pres) injection 200 mg (has no administration in time range)  Tdap (BOOSTRIX) injection 0.5 mL (0.5 mLs Intramuscular Given 07/14/19 0344)  sodium chloride 0.9 % bolus 500 mL (0 mLs Intravenous Stopped 07/14/19 0413)  oxyCODONE-acetaminophen (PERCOCET/ROXICET) 5-325 MG per tablet 1 tablet (1 tablet Oral Given 07/14/19 0343)    ED Course  I have reviewed the triage vital signs and the nursing notes.  Pertinent labs & imaging results that were available during my care of the patient were reviewed by me and considered in my medical decision making (see chart for details).    MDM Rules/Calculators/A&P                      Patient presents to the  emergency department for evaluation of left lower leg injury.  Patient cut his foot and lower leg on broken glass.  No evidence of neurovascular injury.  Wounds were sutured and sutures will need to be removed in 10 days.  He does have a fracture at the base of the left great toe.  This is not immediately adjacent to the laceration, but will cover with antibiotics.  Follow-up with his orthopedic surgeon, Dr. Ninfa Linden.  Patient noted to be tachycardic at arrival.  This is slowly improving.  EMS raise concern for possible SVT but all I have seen in the patient is sinus tachycardia.  This is likely multifactorial.  He does have occasional PVCs but no other arrhythmia noted.  I reviewed the records from cardiology.  He has had a thorough cardiovascular work-up and no abnormality is noted.  He is awaiting outpatient cardiac monitoring, but no further work-up necessary at this time. Final Clinical Impression(s) / ED Diagnoses Final  diagnoses:  Closed nondisplaced fracture of proximal phalanx of left great toe, initial encounter  Laceration of left foot, initial encounter  Leg laceration, left, initial encounter    Rx / DC Orders ED Discharge Orders         Ordered    doxycycline (VIBRAMYCIN) 100 MG capsule  2 times daily     07/14/19 0431           Orpah Greek, MD 07/14/19 [id number]    Orpah Greek, MD 07/14/19 [phone]

## 2019-07-15 MED FILL — ?DOXYCYCLINE HYCLATE 100MG: 100 | 7 days supply | Qty: 14 | Fill #0

## 2019-07-23 ENCOUNTER — Telehealth: Payer: Self-pay | Admitting: Primary Care

## 2019-07-23 NOTE — Telephone Encounter (Signed)
Pt call to find out what he need to do since he just got the Denied letter from [facility], tha application,Pt applied for CAFA, application and documents scanned onbase and dropped account # [id number] into [place] for HB to review for eligibility.PBalance, Pt has Denied letter from [facility] attach, also Pt get Section 8 income attach  Patient submitted application for financial assistance however account is pending potential Medicaid no further action needed at this time. 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

## 2019-08-06 ENCOUNTER — Telehealth: Payer: Self-pay | Admitting: *Deleted

## 2019-08-06 NOTE — Telephone Encounter (Signed)
Discussed the Patient financial assistance program available at [facility]. Patient enrolled for [employer] to mail a 14 day ZIO XT long term holter monitor to his home. Patient to call [employer] at [phone], select option 4 and then option 2.  Patient to ask to apply for financial assistance for ZIO XT monitor.  Patient will need to supply household income and family size. If assistance is not sufficient , patient can just mail the monitor back in the blue box with Prepaid postage on it.  Patient will not be charged for a monitor that does not have data on it/ was not applied.

## 2019-08-16 MED FILL — GABAPENTIN 600 MG TABLET: 600 | 30 days supply | Qty: 180 | Fill #1

## 2019-08-19 ENCOUNTER — Encounter (HOSPITAL_COMMUNITY): Payer: Self-pay

## 2019-08-19 ENCOUNTER — Telehealth (HOSPITAL_COMMUNITY): Payer: Self-pay | Admitting: Emergency Medicine

## 2019-08-19 MED FILL — ?METOPROLOL 100 MG TABLET: 100 | 1 days supply | Qty: 1 | Fill #0

## 2019-08-19 NOTE — Telephone Encounter (Signed)
Reaching out to patient to offer assistance regarding upcoming cardiac imaging study; pt verbalizes understanding of appt date/time, parking situation and where to check in, pre-test NPO status and medications ordered, and verified current allergies; name and call back number provided for further questions should they arise Marchia Bond RN Navigator Cardiac Imaging [facility] [phone] office [phone] cell.   Pt states he has not had his Viagra in several days leading up to tomorrows appt.

## 2019-08-20 ENCOUNTER — Ambulatory Visit (HOSPITAL_COMMUNITY)
Admission: RE | Admit: 2019-08-20 | Discharge: 2019-08-20 | Disposition: A | Discharge status: Home / Self Care | Payer: BLUE CROSS/BLUE SHIELD | Source: Ambulatory Visit | Attending: Cardiology | Admitting: Cardiology

## 2019-08-20 ENCOUNTER — Other Ambulatory Visit: Payer: Self-pay

## 2019-08-20 ENCOUNTER — Encounter (HOSPITAL_COMMUNITY): Payer: Self-pay

## 2019-08-20 DIAGNOSIS — Z006 Encounter for examination for normal comparison and control in clinical research program: Secondary | ICD-10-CM

## 2019-08-20 DIAGNOSIS — R072 Precordial pain: Secondary | ICD-10-CM

## 2019-08-20 MED ORDER — METOPROLOL TARTRATE 5 MG/5ML IV SOLN
INTRAVENOUS | Status: AC
  Filled 2019-08-20: qty 10

## 2019-08-20 MED ORDER — NITROGLYCERIN 0.4 MG SL SUBL
SUBLINGUAL_TABLET | SUBLINGUAL | Status: AC
  Filled 2019-08-20: qty 2

## 2019-08-20 MED ORDER — NITROGLYCERIN 0.4 MG SL SUBL: 0.8000 mg | SUBLINGUAL_TABLET | Freq: Once | SUBLINGUAL | Status: DC

## 2019-08-20 MED ORDER — IOHEXOL 350 MG/ML SOLN
100.0000 mL | Freq: Once | INTRAVENOUS | Status: AC | PRN
  Administered 2019-08-20: 100 mL via INTRAVENOUS

## 2019-08-20 MED ORDER — METOPROLOL TARTRATE 5 MG/5ML IV SOLN
5.0000 mg | INTRAVENOUS | Status: DC | PRN
  Administered 2019-08-20 (×2): 5 mg via INTRAVENOUS

## 2019-08-20 MED FILL — PROPRANOLOL ER 60 MG CAP: 60 | 30 days supply | Qty: 30 | Fill #2

## 2019-08-20 MED FILL — SILDENAFIL CITRATE 100 MG T: 100 | 30 days supply | Qty: 10 | Fill #2

## 2019-08-20 MED FILL — ?DICLOFENAC SOD EC 50MG TAB: 50 | 30 days supply | Qty: 90 | Fill #1

## 2019-08-20 NOTE — Research (Signed)
Cadfem Informed Consent    Patient Name: Blake [name]    Subject met inclusion and exclusion criteria.  The informed consent form, study requirements and expectations were reviewed with the subject and questions and concerns were addressed prior to the signing of the consent form.  The subject verbalized understanding of the trail requirements.  The subject agreed to participate in the CADFEM trial and signed the informed consent.  The informed consent was obtained prior to performance of any protocol-specific procedures for the subject.  A copy of the signed informed consent was given to the subject and a copy was placed in the subject's medical record.   [name]

## 2019-08-22 ENCOUNTER — Ambulatory Visit (HOSPITAL_COMMUNITY)
Admission: RE | Admit: 2019-08-22 | Discharge: 2019-08-22 | Disposition: A | Discharge status: Home / Self Care | Payer: BLUE CROSS/BLUE SHIELD | Source: Ambulatory Visit | Attending: Cardiology | Admitting: Cardiology

## 2019-08-22 DIAGNOSIS — R072 Precordial pain: Secondary | ICD-10-CM | POA: Diagnosis not present

## 2019-08-23 ENCOUNTER — Telehealth: Payer: Self-pay

## 2019-08-23 MED ORDER — ASPIRIN EC 81 MG PO TBEC: 81.0000 mg | DELAYED_RELEASE_TABLET | Freq: Every day | ORAL | 3 refills | Status: AC

## 2019-08-23 NOTE — Telephone Encounter (Signed)
-----   Message from Sueanne Margarita, MD sent at 08/22/2019  1:21 PM EST ----- Please let patient know that coronary CTA shows mild to moderate plaque in his coronary arteries c/w CAD.  Please start ASA 81mg  daily. Please get a copy of last FLP, HbA1C and ALT from PCP.  We sent the study for FFR to determine if there is reduced blood flow in the RCA and LAD

## 2019-08-24 DIAGNOSIS — R072 Precordial pain: Secondary | ICD-10-CM | POA: Diagnosis not present

## 2019-08-27 ENCOUNTER — Telehealth: Payer: Self-pay

## 2019-08-27 DIAGNOSIS — R072 Precordial pain: Secondary | ICD-10-CM

## 2019-08-27 DIAGNOSIS — G4733 Obstructive sleep apnea (adult) (pediatric): Secondary | ICD-10-CM

## 2019-08-27 DIAGNOSIS — R079 Chest pain, unspecified: Secondary | ICD-10-CM

## 2019-08-27 NOTE — Telephone Encounter (Signed)
Patient states he will hold off on starting Imdur. He is always waiting to have back surgery done towards the end of March and wants to make sure he is cleared for it.

## 2019-08-27 NOTE — Telephone Encounter (Signed)
new mask and mask fitting ordered via community message through [place] a for [place] a part of [employer].

## 2019-08-27 NOTE — Telephone Encounter (Signed)
Patient states that he will start Imdur if Dr. Radford Pax thinks it is necessary but wanted to know if there were any other options. He states that his chest pain does not bother him that much and it is just every once in a while.

## 2019-08-27 NOTE — Telephone Encounter (Signed)
If pain is not a bother then hold off with meds.  He has 1 small branch that has reduced flow but I'm not convinced that this is causing his CP - folowup with me in 3 months

## 2019-08-27 NOTE — Telephone Encounter (Signed)
Blake [name]  Please address the mask issue

## 2019-08-27 NOTE — Telephone Encounter (Signed)
-----   Message from Sueanne Margarita, MD sent at 08/23/2019  5:33 PM EST ----- Please order a HbA1C, FLP and CMET  Traci ----- Message ----- From: Antonieta Iba, RN Sent: 08/23/2019  12:41 PM EST To: Sueanne Margarita, MD  The patient has been notified of the result and verbalized understanding.  All questions (if any) were answered. Antonieta Iba, RN 08/23/2019 12:39 PM   Patient has not had any lab work done in the past year.

## 2019-08-27 NOTE — Telephone Encounter (Signed)
Patient will come in tomorrow for lab work.   Patient also states that he has not received his new mask so he has not been using his CPAP. Also states that he was supposed to be set up for a fitting which has not happened.

## 2019-08-27 NOTE — Telephone Encounter (Signed)
-----   Message from Sueanne Margarita, MD sent at 08/27/2019 12:06 PM EST ----- We could order Imdur for CP but he would have to stop his Viagra  Please find out what he wants to do  Traci ----- Message ----- From: Antonieta Iba, RN Sent: 08/27/2019  11:53 AM EST To: Sueanne Margarita, MD  The patient has been notified of the result and verbalized understanding.  All questions (if any) were answered. Antonieta Iba, RN 08/27/2019 11:52 AM   Patient states he is only having chest pain every once in a while.

## 2019-08-28 ENCOUNTER — Other Ambulatory Visit: Payer: BLUE CROSS/BLUE SHIELD

## 2019-08-30 NOTE — Telephone Encounter (Signed)
Sandy from [facility] called back to repot the patient has has an appointment scheduled 08/30/19 at 9:30.

## 2019-08-30 NOTE — Telephone Encounter (Signed)
Patient confirms he has an appointment.

## 2019-09-04 MED FILL — ?DOXYCYCLINE HYCLATE 100MG: 100 | 7 days supply | Qty: 14 | Fill #0

## 2019-09-04 MED FILL — PROPRANOLOL ER 60 MG CAP: 60 | 30 days supply | Qty: 30 | Fill #2

## 2019-09-04 MED FILL — ?DICLOFENAC SOD EC 50MG TAB: 50 | 30 days supply | Qty: 90 | Fill #1

## 2019-09-04 MED FILL — MELOXICAM 15 MG TABLET: 15 | 30 days supply | Qty: 30 | Fill #2

## 2019-09-04 MED FILL — SILDENAFIL CITRATE 100 MG T: 100 | 30 days supply | Qty: 10 | Fill #2

## 2019-09-11 ENCOUNTER — Telehealth: Payer: Self-pay | Admitting: Cardiology

## 2019-09-11 NOTE — Telephone Encounter (Signed)
Patient calling stating he went to his sleep study and his mask is pending approval.

## 2019-09-13 ENCOUNTER — Other Ambulatory Visit: Payer: Self-pay

## 2019-09-13 ENCOUNTER — Other Ambulatory Visit: Payer: BLUE CROSS/BLUE SHIELD | Admitting: *Deleted

## 2019-09-13 DIAGNOSIS — R079 Chest pain, unspecified: Secondary | ICD-10-CM

## 2019-09-13 DIAGNOSIS — G4733 Obstructive sleep apnea (adult) (pediatric): Secondary | ICD-10-CM

## 2019-09-13 DIAGNOSIS — R072 Precordial pain: Secondary | ICD-10-CM

## 2019-09-14 LAB — COMPREHENSIVE METABOLIC PANEL
ALT: 15 IU/L (ref 0–44)
AST: 16 IU/L (ref 0–40)
Albumin/Globulin Ratio: 1.7 (ref 1.2–2.2)
Albumin: 4 g/dL (ref 4.0–5.0)
Alkaline Phosphatase: 71 IU/L (ref 39–117)
BUN/Creatinine Ratio: 13 (ref 9–20)
BUN: 13 mg/dL (ref 6–24)
Bilirubin Total: 0.2 mg/dL (ref 0.0–1.2)
CO2: 19 mmol/L — ABNORMAL LOW (ref 20–29)
Calcium: 9 mg/dL (ref 8.7–10.2)
Chloride: 107 mmol/L — ABNORMAL HIGH (ref 96–106)
Creatinine, Ser: 0.98 mg/dL (ref 0.76–1.27)
GFR calc Af Amer: 105 mL/min/{1.73_m2} (ref >59)
GFR calc non Af Amer: 91 mL/min/{1.73_m2} (ref >59)
Globulin, Total: 2.3 g/dL (ref 1.5–4.5)
Glucose: 88 mg/dL (ref 65–99)
Potassium: 4.7 mmol/L (ref 3.5–5.2)
Sodium: 141 mmol/L (ref 134–144)
Total Protein: 6.3 g/dL (ref 6.0–8.5)

## 2019-09-14 LAB — LIPID PANEL
Chol/HDL Ratio: 3.9 ratio (ref 0.0–5.0)
Cholesterol, Total: 154 mg/dL (ref 100–199)
HDL: 40 mg/dL (ref >39)
LDL Chol Calc (NIH): 88 mg/dL (ref 0–99)
Triglycerides: 146 mg/dL (ref 0–149)
VLDL Cholesterol Cal: 26 mg/dL (ref 5–40)

## 2019-09-14 LAB — HEMOGLOBIN A1C
Est. average glucose Bld gHb Est-mCnc: 114 mg/dL
Hgb A1c MFr Bld: 5.6 % (ref 4.8–5.6)

## 2019-09-16 ENCOUNTER — Telehealth: Payer: Self-pay

## 2019-09-16 DIAGNOSIS — Z79899 Other long term (current) drug therapy: Secondary | ICD-10-CM

## 2019-09-16 MED ORDER — ATORVASTATIN CALCIUM 20 MG PO TABS: 20.0000 mg | ORAL_TABLET | Freq: Every day | ORAL | 3 refills | Status: DC

## 2019-09-16 MED FILL — ATORVASTATIN CALCIUM 20 MG: 20 | 30 days supply | Qty: 30 | Fill #0

## 2019-09-16 NOTE — Telephone Encounter (Signed)
The patient has been notified of the result and verbalized understanding.  All questions (if any) were answered. Antonieta Iba, RN 09/16/2019 10:24 AM

## 2019-09-16 NOTE — Telephone Encounter (Signed)
-----   Message from Sueanne Margarita, MD sent at 09/14/2019 11:55 PM EST ----- Due to elevated coronary Ca score his LDL goal is < 70.  Please start Lipitor 20mg  daily and repeat FLP and ALT in 6 weeks

## 2019-09-30 NOTE — Telephone Encounter (Signed)
error 

## 2019-10-04 ENCOUNTER — Telehealth: Payer: Self-pay | Admitting: *Deleted

## 2019-10-04 NOTE — Telephone Encounter (Signed)
   [facility] Pre-operative Risk Assessment    Request for surgical clearance:  1. What type of surgery is being performed? L1-2, L2-3, L3-4 LUMBAR LAMINECTOMY   2. When is this surgery scheduled? TBD   3. What type of clearance is required (medical clearance vs. Pharmacy clearance to hold med vs. Both)? MEDICAL  4. Are there any medications that need to be held prior to surgery and how long? ASA    5. Practice name and name of physician performing surgery? [facility] [place]; DR. Jeneen Rinks NITKA  6. What is your office phone number [phone]    7.   What is your office fax number [phone] ATTN: SHERRIE  8.   Anesthesia type (None, local, MAC, general) ? GENERAL    Julaine Hua 10/04/2019, 2:05 PM  _________________________________________________________________   (provider comments below)

## 2019-10-07 ENCOUNTER — Encounter: Payer: Self-pay | Admitting: Specialist

## 2019-10-07 ENCOUNTER — Other Ambulatory Visit: Payer: Self-pay

## 2019-10-07 ENCOUNTER — Ambulatory Visit (INDEPENDENT_AMBULATORY_CARE_PROVIDER_SITE_OTHER): Payer: BLUE CROSS/BLUE SHIELD | Admitting: Specialist

## 2019-10-07 DIAGNOSIS — M48062 Spinal stenosis, lumbar region with neurogenic claudication: Secondary | ICD-10-CM | POA: Diagnosis not present

## 2019-10-07 DIAGNOSIS — D1779 Benign lipomatous neoplasm of other sites: Secondary | ICD-10-CM | POA: Diagnosis not present

## 2019-10-07 MED ORDER — HYDROCODONE-ACETAMINOPHEN 5-325 MG PO TABS: 1.0000 | ORAL_TABLET | Freq: Four times a day (QID) | ORAL | 0 refills | Status: DC | PRN

## 2019-10-07 NOTE — Telephone Encounter (Signed)
Yes ok to proceed with surgery but the surgeon and patient need to understand that he is at least moderate risk for a major periop cardiac event.  Traci

## 2019-10-07 NOTE — Telephone Encounter (Signed)
Dr. Radford Pax you saw pt back in 05/2019 for pre-op clearance for back surgery, he had chest pain so had nuc which was abnormal then cardiac CTA with distal disease medical treatment , rare pain now - just wanted to make sure ok to clear for surgery?  Thanks.  Mickel Baas,  Pre-op
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
## 2019-10-07 NOTE — Patient Instructions (Signed)
Avoid bending, stooping and avoid lifting weights greater than 10 lbs. Avoid prolong standing and walking. Order for a new walker with wheels. Surgery scheduling secretary [name], will call you in the next week to schedule for surgery.  Surgery recommended is a one level lumbar decompression L4-5 with bilateral partial hemilaminectomies. Take hydrocodone for for pain. Risk of surgery includes risk of infection 1 in 200 patients, bleeding 1/2% chance you would need a transfusion.   Risk to the nerves is one in 10,000. Expect improved walking and standing tolerance. Expect relief of leg pain but numbness may persist depending on the length and degree of pressure that has been present.

## 2019-10-07 NOTE — Addendum Note (Signed)
Addended by: Basil Dess on: 10/07/2019 11:24 AM   Modules accepted: Orders

## 2019-10-08 ENCOUNTER — Telehealth: Payer: Self-pay | Admitting: Specialist

## 2019-10-08 NOTE — Telephone Encounter (Signed)
Rx was sent to [facility] and they do not fill opioids. Needs to be sent to [facility] [address].   [phone]

## 2019-10-08 NOTE — Telephone Encounter (Signed)
   Primary Cardiologist: Fransico Him, MD  Chart reviewed as part of pre-operative protocol coverage. Given past medical history and time since last visit, based on ACC/AHA guidelines, Blake [name] would be at acceptable risk for the planned procedure without further cardiovascular testing. Per Dr. Radford Pax, this patient is a MODERATE risk for cardiac complications peri-operatively.   I will route this recommendation to the requesting party via Epic fax function and remove from pre-op pool.  Please call with questions.  Phill Myron. Kanesha Cadle DNP, ANP, AACC  10/08/2019, 9:30 AM

## 2019-10-09 ENCOUNTER — Telehealth: Payer: Self-pay | Admitting: Specialist

## 2019-10-09 ENCOUNTER — Other Ambulatory Visit: Payer: Self-pay | Admitting: Specialist

## 2019-10-09 MED ORDER — HYDROCODONE-ACETAMINOPHEN 5-325 MG PO TABS: 1.0000 | ORAL_TABLET | Freq: Four times a day (QID) | ORAL | 0 refills | Status: DC | PRN

## 2019-10-09 NOTE — Telephone Encounter (Signed)
Sent to [facility] [address].

## 2019-10-09 NOTE — Telephone Encounter (Signed)
Duplicatre message pending repsonse from Dr. [name]

## 2019-10-09 NOTE — Telephone Encounter (Signed)
Patient has called again to state medication was sent to [facility] and [facility] have [facility] [address].  Please call patient to advise.

## 2019-10-09 NOTE — Telephone Encounter (Signed)
Rx was sent to [facility] and they do not fill opioids. Needs to be [facility] [address].

## 2019-10-09 NOTE — Telephone Encounter (Signed)
I called and advised that [facility]

## 2019-10-10 NOTE — Telephone Encounter (Signed)
[facility] has not received monitor back from patient. They have attempted to contact the patient on 3 occasions to request monitor be returned to [facility].  CANCELLED ORDER 

## 2019-10-18 ENCOUNTER — Telehealth: Payer: Self-pay | Admitting: *Deleted

## 2019-10-18 DIAGNOSIS — G4733 Obstructive sleep apnea (adult) (pediatric): Secondary | ICD-10-CM

## 2019-10-18 NOTE — Telephone Encounter (Signed)
Supply order sent to re-supply team [facility]) at [phone].

## 2019-10-18 NOTE — Telephone Encounter (Signed)
Supplies ordered and faxed to [facility]: [facility] @ [phone].

## 2019-10-30 ENCOUNTER — Other Ambulatory Visit: Payer: BLUE CROSS/BLUE SHIELD

## 2019-11-06 ENCOUNTER — Ambulatory Visit (INDEPENDENT_AMBULATORY_CARE_PROVIDER_SITE_OTHER): Payer: BLUE CROSS/BLUE SHIELD | Admitting: Specialist

## 2019-11-06 ENCOUNTER — Encounter: Payer: Self-pay | Admitting: Specialist

## 2019-11-06 ENCOUNTER — Ambulatory Visit: Payer: Self-pay

## 2019-11-06 ENCOUNTER — Other Ambulatory Visit: Payer: Self-pay

## 2019-11-06 VITALS — BP 134/101 | HR 86 | Ht 74.0 in | Wt 372.0 lb

## 2019-11-06 DIAGNOSIS — D1779 Benign lipomatous neoplasm of other sites: Secondary | ICD-10-CM | POA: Diagnosis not present

## 2019-11-06 DIAGNOSIS — M79604 Pain in right leg: Secondary | ICD-10-CM | POA: Diagnosis not present

## 2019-11-06 DIAGNOSIS — M48062 Spinal stenosis, lumbar region with neurogenic claudication: Secondary | ICD-10-CM

## 2019-11-06 DIAGNOSIS — M4815 Ankylosing hyperostosis [Forestier], thoracolumbar region: Secondary | ICD-10-CM

## 2019-11-06 DIAGNOSIS — M546 Pain in thoracic spine: Secondary | ICD-10-CM

## 2019-11-06 MED ORDER — OXAPROZIN 600 MG PO TABS: 600.0000 mg | ORAL_TABLET | Freq: Every day | ORAL | 3 refills | Status: DC

## 2019-11-06 MED FILL — OXAPROZIN 600 MG TABLET: 600 | 30 days supply | Qty: 30 | Fill #0

## 2019-11-06 NOTE — Patient Instructions (Addendum)
Plan: Avoid bending, stooping and avoid lifting weights greater than 10 lbs. Avoid prolong standing and walking. Order for a new walker with wheels. Surgery scheduling secretary [name], will call you in the next week to schedule for surgery.  Surgery recommended is a one level lumbar decompression L4-5 with bilateral partial hemilaminectomies. Take hydrocodone for for pain. Risk of surgery includes risk of infection 1 in 200 patients, bleeding 1/2% chance you would need a transfusion.   Risk to the nerves is one in 10,000. Expect improved walking and standing tolerance. Expect relief of leg pain but numbness may persist depending on the length and degree of pressure that has been present The information on [facility] is supplied for you to read and understand. Some of your back pain is due to a gradual stiffening of the spine (ankylosis) this is a chronic condition and surgery will not relieve the pain due to arthritis only that due to spinal canal narrowing.   may be of benefit. Hemp CBD capsules, amazon.com 5,000-7,000 mg per bottle, 60 capsules per bottle, take one capsule twice a day. Cane in the right hand to use with right leg weight bearing. Follow-Up Instructions: No follow-ups on file.

## 2019-11-06 NOTE — Progress Notes (Signed)
Office Visit Note   Patient: Blake [name]           Date of Birth: 04-Sep-1970           MRN: [id number] Visit Date: 11/06/2019              Requested by: [name], NP [address] [place],  [place] [address] PCP: [name], NP   Assessment & Plan: Visit Diagnoses:  1. Pain in thoracic spine   2. Spinal stenosis of lumbar region with neurogenic claudication   3. Epidural lipomatosis   4. Pain in right leg   5. Forestier's disease of thoracolumbar region     Plan:Plan: Avoid bending, stooping and avoid lifting weights greater than 10 lbs. Avoid prolong standing and walking. Order for a new walker with wheels. Surgery scheduling secretary [name], will call you in the next week to schedule for surgery.  Surgery recommended is a one level lumbar decompression L4-5 with bilateral partial hemilaminectomies. Take hydrocodone for for pain. Risk of surgery includes risk of infection 1 in 200 patients, bleeding 1/2% chance you would need a transfusion.   Risk to the nerves is one in 10,000. Expect improved walking and standing tolerance. Expect relief of leg pain but numbness may persist depending on the length and degree of pressure that has been present The information on DISH is supplied for you to read and understand. Some of your back pain is due to a gradual stiffening of the spine (ankylosis) this is a chronic condition and surgery will not relieve the pain due to arthritis only that due to spinal canal narrowing.   Follow-Up Instructions: Return in about 4 weeks (around 12/04/2019).   Orders:  Orders Placed This Encounter  Procedures  . XR Thoracic Spine 2 View   No orders of the defined types were placed in this encounter.     Procedures: No procedures performed   Clinical Data: No additional findings.   Subjective: Chief Complaint  Patient presents with  . Lower Back - Follow-up    49 year old male with history of lumbar spinal  stenosis and spondylosis pain. He has pain that never stops hurting. He has pain in the mid back and pain in the lumbar spine. It is worse with standing and walking. There is some improvement and it is more manageable with sitting and use of a cane. He has complaints of right femur bone having pain and has trouble lying on the right side. He has leg weakness sometimes and today he has been having pain in the area of the upper back below the scapulas. He has pain constantly and it never stops. Has not had cevical ESI as yet.    Review of Systems  Constitutional: Negative.   HENT: Negative.   Respiratory: Negative.   Cardiovascular: Negative.   Gastrointestinal: Negative.   Endocrine: Negative.   Genitourinary: Negative.   Musculoskeletal: Positive for arthralgias, back pain and gait problem. Negative for joint swelling, myalgias, neck pain and neck stiffness.  Skin: Negative.  Negative for color change, pallor, rash and wound.  Allergic/Immunologic: Negative.  Negative for environmental allergies, food allergies and immunocompromised state.  Neurological: Negative for dizziness, tremors, seizures, syncope, facial asymmetry, speech difficulty, weakness, light-headedness, numbness and headaches.  Hematological: Negative.  Negative for adenopathy. Does not bruise/bleed easily.  Psychiatric/Behavioral: Negative.  Negative for agitation, behavioral problems, confusion, decreased concentration, dysphoric mood, hallucinations, self-injury, sleep disturbance and suicidal ideas. The patient is not nervous/anxious  and is not hyperactive.      Objective: Vital Signs: BP (!) 134/101 (BP Location: Left Arm, Patient Position: Sitting)   Pulse 86   Ht 6\' 2"  (1.88 m)   Wt (!) 372 lb (168.7 kg)   BMI 47.76 kg/m   Physical Exam Constitutional:      Appearance: He is well-developed.  HENT:     Head: Normocephalic and atraumatic.  Eyes:     Pupils: Pupils are equal, round, and reactive to light.    Pulmonary:     Effort: Pulmonary effort is normal.     Breath sounds: Normal breath sounds.  Abdominal:     General: Bowel sounds are normal.     Palpations: Abdomen is soft.  Musculoskeletal:     Cervical back: Normal range of motion and neck supple.     Lumbar back: Negative right straight leg raise test and negative left straight leg raise test.  Skin:    General: Skin is warm and dry.  Neurological:     Mental Status: He is alert and oriented to person, place, and time.  Psychiatric:        Behavior: Behavior normal.        Thought Content: Thought content normal.        Judgment: Judgment normal.     Back Exam   Tenderness  The patient is experiencing tenderness in the lumbar.  Range of Motion  Extension: abnormal  Flexion: abnormal  Lateral bend right: abnormal  Lateral bend left: abnormal  Rotation right: abnormal  Rotation left: abnormal   Muscle Strength  Right Quadriceps:  5/5  Left Quadriceps:  5/5  Right Hamstrings:  5/5  Left Hamstrings:  5/5   Tests  Straight leg raise right: negative Straight leg raise left: negative  Reflexes  Patellar: 0/4 Achilles: 0/4 Babinski's sign: normal   Other  Toe walk: normal Heel walk: normal Sensation: normal      Specialty Comments:  No specialty comments available.  Imaging: XR Thoracic Spine 2 View  Result Date: 11/06/2019 AP and lateral radiographs show anterior bridging syndesmophytes across the mid and lower thoracic spine with disc narrowing. The osteophytes are large and represent Diffuse Idiopathic Spinal Hyperostosis. The osteophytes are primarily right sided and anterior and coalescing. No acute finding.    PMFS History: Patient Active Problem List   Diagnosis Date Noted  . Cervical dystonia 07/09/2019  . Neck pain 07/09/2019  . OSA (obstructive sleep apnea) 10/31/2018  . Status post total replacement of right hip 09/07/2018  . Unilateral primary osteoarthritis, right hip 08/13/2018   . Severe obesity (BMI >= 40) ([facility]) 08/13/2018  . Chronic bilateral low back pain with bilateral sciatica 04/02/2018  . Epidural lipomatosis 04/02/2018  . Right hip pain 04/02/2018  . Preop cardiovascular exam 03/30/2018  . Dilated aortic root ([facility])   . Chest pain 07/27/2017  . Palpitations 07/26/2017  . ERECTILE DYSFUNCTION, MILD 11/12/2007  . Tobacco abuse 11/12/2007   Past Medical History:  Diagnosis Date  . Arthritis   . Back pain   . Dilated aortic root ([facility])    77mm ascending aorta by echo 09/2017, see 08-2018 echo in epic for newer results   . ERECTILE DYSFUNCTION, MILD 11/12/2007   Qualifier: Diagnosis of  By: Carolyne Littles    . History of blood transfusion    1997, received 15 units of blood   . Morbid obesity ([facility])   . Neck pain   . PVC's (premature ventricular contractions)   .  TOBACCO ABUSE 11/12/2007   Qualifier: Diagnosis of  By: Carmie End MD, Junie Panning    . Tremor    head and hands , controlled on cymbalta    Family History  Problem Relation Age of Onset  . COPD Mother   . Leukemia Father   . Hypertension Sister   . Diabetes Sister     Past Surgical History:  Procedure Laterality Date  . FACIAL FRACTURE SURGERY  09/1995   left side of face crush injury, carnial fracture   . TOTAL HIP ARTHROPLASTY Right 09/07/2018   Procedure: RIGHT TOTAL HIP ARTHROPLASTY ANTERIOR APPROACH;  Surgeon: Mcarthur Rossetti, MD;  Location: [facility];  Service: Orthopedics;  Laterality: Right;   Social History   Occupational History  . Occupation: waiting on disability  Tobacco Use  . Smoking status: Current Every Day Smoker    Packs/day: 0.25    Years: 30.00    Pack years: 7.50  . Smokeless tobacco: Never Used  . Tobacco comment: down to 3-4 cigarettes a day   Substance and Sexual Activity  . Alcohol use: Yes    Comment: Drink on weekends  . Drug use: No  . Sexual activity: Not Currently

## 2019-11-14 ENCOUNTER — Other Ambulatory Visit: Payer: Self-pay

## 2019-11-20 ENCOUNTER — Ambulatory Visit: Payer: BLUE CROSS/BLUE SHIELD | Admitting: Surgery

## 2019-11-21 ENCOUNTER — Ambulatory Visit (INDEPENDENT_AMBULATORY_CARE_PROVIDER_SITE_OTHER): Payer: BLUE CROSS/BLUE SHIELD | Admitting: Surgery

## 2019-11-21 ENCOUNTER — Other Ambulatory Visit: Payer: Self-pay

## 2019-11-21 ENCOUNTER — Other Ambulatory Visit (HOSPITAL_COMMUNITY): Payer: BLUE CROSS/BLUE SHIELD

## 2019-11-21 ENCOUNTER — Encounter (HOSPITAL_COMMUNITY): Payer: Self-pay

## 2019-11-21 ENCOUNTER — Encounter: Payer: Self-pay | Admitting: Surgery

## 2019-11-21 ENCOUNTER — Ambulatory Visit (HOSPITAL_COMMUNITY)
Admission: RE | Admit: 2019-11-21 | Discharge: 2019-11-21 | Disposition: A | Discharge status: Home / Self Care | Payer: BLUE CROSS/BLUE SHIELD | Source: Ambulatory Visit | Attending: Surgery | Admitting: Surgery

## 2019-11-21 ENCOUNTER — Encounter (HOSPITAL_COMMUNITY)
Admission: RE | Admit: 2019-11-21 | Discharge: 2019-11-21 | Disposition: A | Discharge status: Home / Self Care | Payer: BLUE CROSS/BLUE SHIELD | Source: Ambulatory Visit | Attending: Specialist | Admitting: Specialist

## 2019-11-21 VITALS — BP 126/86 | HR 83 | Ht 74.0 in | Wt 369.0 lb

## 2019-11-21 DIAGNOSIS — Z01818 Encounter for other preprocedural examination: Secondary | ICD-10-CM

## 2019-11-21 DIAGNOSIS — M48062 Spinal stenosis, lumbar region with neurogenic claudication: Secondary | ICD-10-CM

## 2019-11-21 HISTORY — DX: Cardiac murmur, unspecified: R01.1

## 2019-11-21 LAB — URINALYSIS, ROUTINE W REFLEX MICROSCOPIC
Bilirubin Urine: NEGATIVE
Glucose, UA: NEGATIVE mg/dL
Hgb urine dipstick: NEGATIVE
Ketones, ur: NEGATIVE mg/dL
Leukocytes,Ua: NEGATIVE
Nitrite: NEGATIVE
Protein, ur: NEGATIVE mg/dL
Specific Gravity, Urine: 1.018 (ref 1.005–1.030)
pH: 5 (ref 5.0–8.0)

## 2019-11-21 LAB — CBC
HCT: 52.8 % — ABNORMAL HIGH (ref 39.0–52.0)
Hemoglobin: 17.2 g/dL — ABNORMAL HIGH (ref 13.0–17.0)
MCH: 31.8 pg (ref 26.0–34.0)
MCHC: 32.6 g/dL (ref 30.0–36.0)
MCV: 97.6 fL (ref 80.0–100.0)
Platelets: 273 10*3/uL (ref 150–400)
RBC: 5.41 MIL/uL (ref 4.22–5.81)
RDW: 15.5 % (ref 11.5–15.5)
WBC: 7.5 10*3/uL (ref 4.0–10.5)
nRBC: 0 % (ref 0.0–0.2)

## 2019-11-21 LAB — COMPREHENSIVE METABOLIC PANEL
ALT: 17 U/L (ref 0–44)
AST: 18 U/L (ref 15–41)
Albumin: 3.4 g/dL — ABNORMAL LOW (ref 3.5–5.0)
Alkaline Phosphatase: 56 U/L (ref 38–126)
Anion gap: 10 (ref 5–15)
BUN: 9 mg/dL (ref 6–20)
CO2: 20 mmol/L — ABNORMAL LOW (ref 22–32)
Calcium: 8.5 mg/dL — ABNORMAL LOW (ref 8.9–10.3)
Chloride: 109 mmol/L (ref 98–111)
Creatinine, Ser: 1.14 mg/dL (ref 0.61–1.24)
GFR calc Af Amer: >60 mL/min (ref >60)
GFR calc non Af Amer: >60 mL/min (ref >60)
Glucose, Bld: 100 mg/dL — ABNORMAL HIGH (ref 70–99)
Potassium: 4.7 mmol/L (ref 3.5–5.1)
Sodium: 139 mmol/L (ref 135–145)
Total Bilirubin: 0.4 mg/dL (ref 0.3–1.2)
Total Protein: 5.9 g/dL — ABNORMAL LOW (ref 6.5–8.1)

## 2019-11-21 LAB — SURGICAL PCR SCREEN
MRSA, PCR: NEGATIVE
Staphylococcus aureus: NEGATIVE

## 2019-11-21 LAB — PROTIME-INR
INR: 0.9 (ref 0.8–1.2)
Prothrombin Time: 12 seconds (ref 11.4–15.2)

## 2019-11-21 LAB — HEMOGLOBIN A1C
Hgb A1c MFr Bld: 5.8 % — ABNORMAL HIGH (ref 4.8–5.6)
Mean Plasma Glucose: 119.76 mg/dL

## 2019-11-21 NOTE — Pre-Procedure Instructions (Signed)
Blake [name]  11/21/2019      Your procedure is scheduled on Monday, May 17                Report to [facility], Main Entrance or Entrance "A" at 5:30 AM                       Your surgery or procedure is scheduled for 7:30 AM.   Call this number if you have problems the morning of surgery: [phone]  This is the number for the Pre- Surgical Desk.                   For any other questions, please call [phone], Monday - Friday 8 AM - 4 PM.   Remember:  Do not eat  after midnight.  You may drink clear liquids until 4:30 AM  .  Clear liquids allowed are:     Water, Juice (non-citric and without pulp - diabetics please choose diet or no sugar options), Carbonated beverages - (diabetics please choose diet or no sugar options), Clear Tea, Black Coffee only (no creamer, milk or cream including half and half) and Plain Jell-O only (diabetics please choose diet or no sugar options) Gatorade    Take these medicines the morning of surgery with A SIP OF WATER :  propranolol ER (INDERAL LA)             metoprolol tartrate (LOPRESSOR) If Needed: acetaminophen (TYLENOL) or HYDROcodone-acetaminophen (NORCO/VICODIN)      atorvastatin (LIPITOR)     gabapentin (NEURONTIN)   Follow your surgeon's instructions regarding Aspirin. STOP taking Aspirin Products (Goody Powder, Excedrin Migraine), Ibuprofen (Advil), Naproxen (Aleve), Vitamins and Herbal Products (ie Fish Oil).  Special instructions: No Smoking within 24 hours of surgery.   [place]- Preparing For Surgery  Before surgery, you can play an important role. Because skin is not sterile, your skin needs to be as free of germs as possible. You can reduce the number of germs on your skin by washing with CHG (chlorahexidine gluconate) Soap before surgery.  CHG is an antiseptic cleaner which kills germs and bonds with the skin to continue killing germs even after washing.    Oral Hygiene is also important to reduce your risk  of infection.  Remember - BRUSH YOUR TEETH THE MORNING OF SURGERY WITH YOUR REGULAR TOOTHPASTE  Please do not use if you have an allergy to CHG or antibacterial soaps. If your skin becomes reddened/irritated stop using the CHG.  Do not shave (including legs and underarms) for at least 48 hours prior to first CHG shower. It is OK to shave your face.  Please follow these instructions carefully.   1. Shower the NIGHT BEFORE SURGERY and the MORNING OF SURGERY with CHG.   2. If you chose to wash your hair, wash your hair first as usual with your normal shampoo.  3. After you shampoo, wash your face and private area with the soap you use at home, then rinse your hair and body thoroughly to remove the shampoo and soap.rinse your hair and body thoroughly to remove the shampoo.  4. Use CHG as you would any other liquid soap. You can apply CHG directly to the skin and wash gently with a scrungie or a clean washcloth.   5. Apply the CHG Soap to your body ONLY FROM THE NECK DOWN.  Do not use on open wounds or open sores. Avoid contact  with your eyes, ears, mouth and genitals (private parts).   6. Wash thoroughly, paying special attention to the area where your surgery will be performed.  7. Thoroughly rinse your body with warm water from the neck down.  8. DO NOT shower/wash with your normal soap after using and rinsing off the CHG Soap.  9. Pat yourself dry with a CLEAN TOWEL.  10. Wear CLEAN PAJAMAS to bed the night before surgery, wear comfortable clothes the morning of surgery  11. Place CLEAN SHEETS on your bed the night of your first shower and DO NOT SLEEP WITH PETS.  Day of Surgery: Shower as instructed above  Do not wear lotions, powders, or perfumes, or deodorant. Please wear clean clothes to the hospital/surgery center.   Remember to brush your teeth WITH YOUR REGULAR TOOTHPASTE.  Do not wear jewelry, make-up or nail polish.  Do not wear lotions, powders, or colognes, or  deodorant.  Men may shave face and neck.  Do not bring valuables to the hospital.  [facility] is not responsible for any belongings or valuables.  Contacts, dentures or bridgework may not be worn into surgery.  Leave your suitcase in the car.  After surgery it may be brought to your room.  For patients admitted to the hospital, discharge time will be determined by your treatment team.  Patients discharged the day of surgery will not be allowed to drive home.   Please read over  fact sheets that you were given.

## 2019-11-21 NOTE — Progress Notes (Signed)
PCP - Juluis Mire, NP  Cardiologist -  Dr. Ashok Norris  Chest x-ray - 11/19/2019  EKG - 07/15/19  Stress Test - 06/28/2019  ECHO - 08/31/2018  Cardiac Cath - no  Sleep Study - yes CPAP - was not set up with instructions, CPAP has been removed from home  LABS:CBC, CMP,U/A  ASA-Stopped 11/16/2019  ERAS-yes  HA1C- 11/21/2019- ordered by Surgeon- not a known diabetic Fasting Blood Sugar - no Checks Blood Sugar ____no_ times a day  Anesthesia-  Pt denies having chest pain, sob, or fever at this time. All instructions explained to the pt, with a verbal understanding of the material. Pt agrees to go over the instructions while at home for a better understanding. Pt also instructed to self quarantine after being tested for COVID-19. The opportunity to ask questions was provided.

## 2019-11-21 NOTE — Progress Notes (Signed)
49 year old black male history of L4-5 stenosis, back pain and lower extremity radiculopathy comes in for preop evaluation.  States that symptoms unchanged from previous visit.  He is wanting to proceed with L4-5 decompression as scheduled.  Today history and physical performed.  Review of systems negative.  Procedure discussed along potential hospital stay.  All questions answered.

## 2019-11-22 ENCOUNTER — Other Ambulatory Visit (HOSPITAL_COMMUNITY)
Admission: RE | Admit: 2019-11-22 | Discharge: 2019-11-22 | Disposition: A | Discharge status: Home / Self Care | Payer: BLUE CROSS/BLUE SHIELD | Source: Ambulatory Visit | Attending: Specialist | Admitting: Specialist

## 2019-11-22 DIAGNOSIS — Z20822 Contact with and (suspected) exposure to covid-19: Secondary | ICD-10-CM | POA: Insufficient documentation

## 2019-11-22 DIAGNOSIS — Z01812 Encounter for preprocedural laboratory examination: Secondary | ICD-10-CM | POA: Insufficient documentation

## 2019-11-22 LAB — SARS CORONAVIRUS 2 (TAT 6-24 HRS): SARS Coronavirus 2: NEGATIVE

## 2019-11-22 MED ORDER — BUPIVACAINE LIPOSOME 1.3 % IJ SUSP
20.0000 mL | Freq: Once | INTRAMUSCULAR | Status: DC
  Filled 2019-11-22: qty 20

## 2019-11-22 MED ORDER — DEXTROSE 5 % IV SOLN
3.0000 g | INTRAVENOUS | Status: AC
  Administered 2019-11-25: 3 g via INTRAVENOUS
  Filled 2019-11-22: qty 3
  Filled 2019-11-22: qty 3000

## 2019-11-22 NOTE — Anesthesia Preprocedure Evaluation (Addendum)
Anesthesia Evaluation  Patient identified by MRN, date of birth, ID band Patient awake    Reviewed: Allergy & Precautions, NPO status , Patient's Chart, lab work & pertinent test results  Airway Mallampati: II  TM Distance: >3 FB Neck ROM: Full    Dental  (+) Missing, Dental Advisory Given, Poor Dentition   Pulmonary sleep apnea , Current Smoker and Patient abstained from smoking.,    Pulmonary exam normal breath sounds clear to auscultation       Cardiovascular Normal cardiovascular exam+ Valvular Problems/Murmurs  Rhythm:Regular Rate:Normal  Echo 1. The left ventricle has normal systolic function with an ejection fraction of 60-65%. The cavity size was normal. There is moderately increased left ventricular wall thickness. Left ventricular diastolic parameters were normal.  2. The right ventricle has normal systolic function. The cavity was normal. There is no increase in right ventricular wall thickness.  3. Right atrial size was mildly dilated.  4. The mitral valve is normal in structure.  5. The tricuspid valve is normal in structure.  6. The aortic valve is normal in structure.  7. The aortic root and ascending aorta are normal in size and structure.  8. The interatrial septum was not assessed.    Neuro/Psych negative neurological ROS  negative psych ROS   GI/Hepatic negative GI ROS, Neg liver ROS,   Endo/Other  Morbid obesity  Renal/GU negative Renal ROS     Musculoskeletal  (+) Arthritis ,   Abdominal (+) + obese,   Peds  Hematology negative hematology ROS (+)   Anesthesia Other Findings   Reproductive/Obstetrics negative OB ROS                                                           Anesthesia Evaluation  Patient identified by MRN, date of birth, ID band Patient awake    Reviewed: Allergy & Precautions, NPO status , Patient's Chart, lab work & pertinent test  results  Airway Mallampati: II  TM Distance: >3 FB Neck ROM: Full    Dental   Pulmonary Current Smoker,    Pulmonary exam normal        Cardiovascular Normal cardiovascular exam     Neuro/Psych    GI/Hepatic   Endo/Other    Renal/GU      Musculoskeletal   Abdominal   Peds  Hematology   Anesthesia Other Findings   Reproductive/Obstetrics                             Anesthesia Physical Anesthesia Plan  ASA: II  Anesthesia Plan: Spinal   Post-op Pain Management:    Induction: Intravenous  PONV Risk Score and Plan:   Airway Management Planned: Simple Face Mask  Additional Equipment:   Intra-op Plan:   Post-operative Plan:   Informed Consent: I have reviewed the patients History and Physical, chart, labs and discussed the procedure including the risks, benefits and alternatives for the proposed anesthesia with the patient or authorized representative who has indicated his/her understanding and acceptance.       Plan Discussed with: CRNA and Surgeon  Anesthesia Plan Comments:         Anesthesia Quick Evaluation  Anesthesia Physical Anesthesia Plan  ASA: III  Anesthesia Plan: General   Post-op Pain  Management:    Induction: Intravenous  PONV Risk Score and Plan: 2 and Ondansetron, Dexamethasone, Midazolam and Treatment may vary due to age or medical condition  Airway Management Planned: Oral ETT  Additional Equipment: None  Intra-op Plan:   Post-operative Plan: Extubation in OR  Informed Consent: I have reviewed the patients History and Physical, chart, labs and discussed the procedure including the risks, benefits and alternatives for the proposed anesthesia with the patient or authorized representative who has indicated his/her understanding and acceptance.     Dental advisory given  Plan Discussed with: CRNA  Anesthesia Plan Comments: (PAT note by Karoline Caldwell, PA-C: Pt had preop eval  by cardiology 05/29/19 due to hx of obesity, PVCs, mildly dilated aorta, chest pain. Per Dr. Theodosia Blender note 05/29/19 the pt's chest pain was felt to be somewhat atypical, EKG nonischemic. Lexiscan was ordered to rule out ischemia prior to back surgery. Stress test showed possible inferior ischemia. Dr. Radford Pax ordered CTA with FFR to further eval. CT FFR showed no significant flow limiting lesions. Medical therapy recommended. Dr Radford Pax commented on clearance in telephone encounter 10/07/19, "Yes ok to proceed with surgery but the surgeon and patient need to understand that he is at least moderate risk for a major periop cardiac event."  Preop labs reviewed, unremarkable.   Most recent EKG 07/14/19 shows sinus tach with HR 140 and was done during ED admission for LE lacerations due to broken mirror. Prior EKG done by Dr. Radford Pax 11/18 20 showed sinus tach rate 101.   CHEST - 2 VIEW 11/21/19:  COMPARISON:  Report from prior chest radiograph 12/05/2001 (images unavailable).  FINDINGS: There is mild linear atelectasis and/or scarring within the left lung base. Lungs otherwise clear. No evidence of pleural effusion or pneumothorax. Heart size within normal limits. Aortic atherosclerosis. No acute bony abnormality identified. Thoracic spondylosis.  IMPRESSION: Mild linear atelectasis and/or scarring within the left lung base. Lungs otherwise clear.  Aortic Atherosclerosis (ICD10-I70.0).  CT FFR 08/22/19: IMPRESSION: 1. CT FFR flow analysis demonstrates no significant flow limiting lesions in epicardial vessels. There is reduced flow in the distal aspect of the middle branch of a trifurcating diagonal which likely represents distal tapering of the vessel.  2.  Medical management recommended  Nuclear stress 06/28/19: Nuclear stress EF: 53%. The left ventricular ejection fraction is mildly decreased (45-54%). There was no ST segment deviation noted during stress. No T wave inversion was  noted during stress. Defect 1: There is a medium defect of moderate severity present in the basal inferior, mid inferior and apical inferior location. Findings consistent with inferior ischemia. This pattern may also be seen in diaphragmatic attenuation. This is an intermediate risk study.   TTE 08/31/18: IMPRESSIONS: 1. The left ventricle has normal systolic function with an ejection  fraction of 60-65%. The cavity size was normal. There is moderately  increased left ventricular wall thickness. Left ventricular diastolic  parameters were normal.  2. The right ventricle has normal systolic function. The cavity was  normal. There is no increase in right ventricular wall thickness.  3. Right atrial size was mildly dilated.  4. The mitral valve is normal in structure.  5. The tricuspid valve is normal in structure.  6. The aortic valve is normal in structure.  7. The aortic root and ascending aorta are normal in size and structure.  8. The interatrial septum was not assessed. )       Anesthesia Quick Evaluation

## 2019-11-22 NOTE — Progress Notes (Signed)
Anesthesia Chart Review:  Pt had preop eval by cardiology 05/29/19 due to hx of obesity, PVCs, mildly dilated aorta, chest pain. Per Dr. Theodosia Blender note 05/29/19 the pt's chest pain was felt to be somewhat atypical, EKG nonischemic. Lexiscan was ordered to rule out ischemia prior to back surgery. Stress test showed possible inferior ischemia. Dr. Radford Pax ordered CTA with FFR to further eval. CT FFR showed no significant flow limiting lesions. Medical therapy recommended. Dr Radford Pax commented on clearance in telephone encounter 10/07/19, "Yes ok to proceed with surgery but the surgeon and patient need to understand that he is at least moderate risk for a major periop cardiac event."  Preop labs reviewed, unremarkable.   Most recent EKG 07/14/19 shows sinus tach with HR 140 and was done during ED admission for LE lacerations due to broken mirror. Prior EKG done by Dr. Radford Pax 11/18 20 showed sinus tach rate 101.   CHEST - 2 VIEW 11/21/19:  COMPARISON:  Report from prior chest radiograph 12/05/2001 (images unavailable).  FINDINGS: There is mild linear atelectasis and/or scarring within the left lung base. Lungs otherwise clear. No evidence of pleural effusion or pneumothorax. Heart size within normal limits. Aortic atherosclerosis. No acute bony abnormality identified. Thoracic spondylosis.  IMPRESSION: Mild linear atelectasis and/or scarring within the left lung base. Lungs otherwise clear.  Aortic Atherosclerosis (ICD10-I70.0).  CT FFR 08/22/19: IMPRESSION: 1. CT FFR flow analysis demonstrates no significant flow limiting lesions in epicardial vessels. There is reduced flow in the distal aspect of the middle branch of a trifurcating diagonal which likely represents distal tapering of the vessel.  2.  Medical management recommended  Nuclear stress 06/28/19:  Nuclear stress EF: 53%.  The left ventricular ejection fraction is mildly decreased (45-54%).  There was no ST segment  deviation noted during stress.  No T wave inversion was noted during stress.  Defect 1: There is a medium defect of moderate severity present in the basal inferior, mid inferior and apical inferior location.  Findings consistent with inferior ischemia. This pattern may also be seen in diaphragmatic attenuation.  This is an intermediate risk study.   TTE 08/31/18: IMPRESSIONS: 1. The left ventricle has normal systolic function with an ejection  fraction of 60-65%. The cavity size was normal. There is moderately  increased left ventricular wall thickness. Left ventricular diastolic  parameters were normal.  2. The right ventricle has normal systolic function. The cavity was  normal. There is no increase in right ventricular wall thickness.  3. Right atrial size was mildly dilated.  4. The mitral valve is normal in structure.  5. The tricuspid valve is normal in structure.  6. The aortic valve is normal in structure.  7. The aortic root and ascending aorta are normal in size and structure.  8. The interatrial septum was not assessed.    Wynonia Musty [facility] Short Stay Center/Anesthesiology Phone [phone] 11/22/2019 11:39 AM

## 2019-11-25 ENCOUNTER — Other Ambulatory Visit: Payer: Self-pay

## 2019-11-25 ENCOUNTER — Inpatient Hospital Stay (HOSPITAL_COMMUNITY): Payer: BLUE CROSS/BLUE SHIELD | Admitting: Physician Assistant

## 2019-11-25 ENCOUNTER — Inpatient Hospital Stay (HOSPITAL_COMMUNITY)
Admission: RE | Admit: 2019-11-25 | Discharge: 2019-11-26 | DRG: 519 | Disposition: A | Discharge status: Home Health | Payer: BLUE CROSS/BLUE SHIELD | Attending: Specialist | Admitting: Specialist

## 2019-11-25 ENCOUNTER — Inpatient Hospital Stay (HOSPITAL_COMMUNITY)
Admission: RE | Disposition: A | Discharge status: Home / Self Care | Payer: Self-pay | Source: Home / Self Care | Attending: Specialist

## 2019-11-25 ENCOUNTER — Inpatient Hospital Stay (HOSPITAL_COMMUNITY): Payer: BLUE CROSS/BLUE SHIELD | Admitting: Anesthesiology

## 2019-11-25 ENCOUNTER — Inpatient Hospital Stay (HOSPITAL_COMMUNITY): Payer: BLUE CROSS/BLUE SHIELD

## 2019-11-25 DIAGNOSIS — Z20822 Contact with and (suspected) exposure to covid-19: Secondary | ICD-10-CM | POA: Diagnosis present

## 2019-11-25 DIAGNOSIS — Z791 Long term (current) use of non-steroidal anti-inflammatories (NSAID): Secondary | ICD-10-CM

## 2019-11-25 DIAGNOSIS — Z806 Family history of leukemia: Secondary | ICD-10-CM

## 2019-11-25 DIAGNOSIS — M48062 Spinal stenosis, lumbar region with neurogenic claudication: Secondary | ICD-10-CM | POA: Diagnosis present

## 2019-11-25 DIAGNOSIS — M549 Dorsalgia, unspecified: Secondary | ICD-10-CM | POA: Diagnosis present

## 2019-11-25 DIAGNOSIS — Z419 Encounter for procedure for purposes other than remedying health state, unspecified: Secondary | ICD-10-CM

## 2019-11-25 DIAGNOSIS — Z6841 Body Mass Index (BMI) 40.0 and over, adult: Secondary | ICD-10-CM | POA: Diagnosis not present

## 2019-11-25 DIAGNOSIS — Z825 Family history of asthma and other chronic lower respiratory diseases: Secondary | ICD-10-CM

## 2019-11-25 DIAGNOSIS — D1779 Benign lipomatous neoplasm of other sites: Secondary | ICD-10-CM | POA: Diagnosis not present

## 2019-11-25 DIAGNOSIS — F17210 Nicotine dependence, cigarettes, uncomplicated: Secondary | ICD-10-CM | POA: Diagnosis present

## 2019-11-25 DIAGNOSIS — Z96641 Presence of right artificial hip joint: Secondary | ICD-10-CM | POA: Diagnosis present

## 2019-11-25 DIAGNOSIS — Z7982 Long term (current) use of aspirin: Secondary | ICD-10-CM

## 2019-11-25 DIAGNOSIS — Z833 Family history of diabetes mellitus: Secondary | ICD-10-CM

## 2019-11-25 DIAGNOSIS — Z8249 Family history of ischemic heart disease and other diseases of the circulatory system: Secondary | ICD-10-CM | POA: Diagnosis not present

## 2019-11-25 DIAGNOSIS — Z79899 Other long term (current) drug therapy: Secondary | ICD-10-CM | POA: Diagnosis not present

## 2019-11-25 DIAGNOSIS — M1990 Unspecified osteoarthritis, unspecified site: Secondary | ICD-10-CM | POA: Diagnosis present

## 2019-11-25 HISTORY — PX: LUMBAR LAMINECTOMY/DECOMPRESSION MICRODISCECTOMY: SHX5026

## 2019-11-25 SURGERY — LUMBAR LAMINECTOMY/DECOMPRESSION MICRODISCECTOMY
Anesthesia: General | Site: Spine Lumbar

## 2019-11-25 MED ORDER — METHOCARBAMOL 500 MG PO TABS
ORAL_TABLET | ORAL | Status: AC
  Filled 2019-11-25: qty 1

## 2019-11-25 MED ORDER — LIDOCAINE 2% (20 MG/ML) 5 ML SYRINGE: INTRAMUSCULAR | Status: DC | PRN

## 2019-11-25 MED ORDER — PHENYLEPHRINE 40 MCG/ML (10ML) SYRINGE FOR IV PUSH (FOR BLOOD PRESSURE SUPPORT)
PREFILLED_SYRINGE | INTRAVENOUS | Status: AC
  Filled 2019-11-25: qty 10

## 2019-11-25 MED ORDER — ACETAMINOPHEN 160 MG/5ML PO SOLN: 960.0000 mg | Freq: Once | ORAL | Status: AC

## 2019-11-25 MED ORDER — METHOCARBAMOL 1000 MG/10ML IJ SOLN
500.0000 mg | Freq: Four times a day (QID) | INTRAVENOUS | Status: DC | PRN
  Filled 2019-11-25: qty 5

## 2019-11-25 MED ORDER — PROMETHAZINE HCL 25 MG/ML IJ SOLN: 6.2500 mg | INTRAMUSCULAR | Status: DC | PRN

## 2019-11-25 MED ORDER — MENTHOL 3 MG MT LOZG
1.0000 | LOZENGE | OROMUCOSAL | Status: DC | PRN
  Filled 2019-11-25: qty 9

## 2019-11-25 MED ORDER — LIDOCAINE 2% (20 MG/ML) 5 ML SYRINGE
INTRAMUSCULAR | Status: AC
  Filled 2019-11-25: qty 5

## 2019-11-25 MED ORDER — LACTATED RINGERS IV SOLN: INTRAVENOUS | Status: DC | PRN

## 2019-11-25 MED ORDER — MORPHINE SULFATE (PF) 2 MG/ML IV SOLN: 2.0000 mg | INTRAVENOUS | Status: DC | PRN

## 2019-11-25 MED ORDER — FLEET ENEMA 7-19 GM/118ML RE ENEM: 1.0000 | ENEMA | Freq: Once | RECTAL | Status: DC | PRN

## 2019-11-25 MED ORDER — DEXAMETHASONE SODIUM PHOSPHATE 10 MG/ML IJ SOLN
INTRAMUSCULAR | Status: DC | PRN
  Administered 2019-11-25: 10 mg via INTRAVENOUS

## 2019-11-25 MED ORDER — SODIUM CHLORIDE 0.9% FLUSH
3.0000 mL | Freq: Two times a day (BID) | INTRAVENOUS | Status: DC
  Administered 2019-11-25 (×2): 3 mL via INTRAVENOUS

## 2019-11-25 MED ORDER — HYDROCODONE-ACETAMINOPHEN 10-325 MG PO TABS
2.0000 | ORAL_TABLET | ORAL | Status: DC | PRN
  Administered 2019-11-25 – 2019-11-26 (×4): 2 via ORAL
  Filled 2019-11-25 (×4): qty 2

## 2019-11-25 MED ORDER — ONDANSETRON HCL 4 MG/2ML IJ SOLN
INTRAMUSCULAR | Status: DC | PRN
  Administered 2019-11-25: 4 mg via INTRAVENOUS

## 2019-11-25 MED ORDER — PROPOFOL 500 MG/50ML IV EMUL
INTRAVENOUS | Status: DC | PRN
  Administered 2019-11-25: 50 ug/kg/min via INTRAVENOUS

## 2019-11-25 MED ORDER — CELECOXIB 200 MG PO CAPS
200.0000 mg | ORAL_CAPSULE | Freq: Once | ORAL | Status: AC | PRN
  Administered 2019-11-25: 200 mg via ORAL
  Filled 2019-11-25: qty 1

## 2019-11-25 MED ORDER — EPHEDRINE SULFATE 50 MG/ML IJ SOLN
INTRAMUSCULAR | Status: DC | PRN
Start: 2019-11-25 — End: 2019-11-25
  Administered 2019-11-25: 5 mg via INTRAVENOUS
  Administered 2019-11-25: 20 mg via INTRAVENOUS
  Administered 2019-11-25: 10 mg via INTRAVENOUS
  Administered 2019-11-25: 5 mg via INTRAVENOUS

## 2019-11-25 MED ORDER — PHENOL 1.4 % MT LIQD: 1.0000 | OROMUCOSAL | Status: DC | PRN

## 2019-11-25 MED ORDER — SODIUM CHLORIDE 0.9% FLUSH: 3.0000 mL | INTRAVENOUS | Status: DC | PRN

## 2019-11-25 MED ORDER — ACETAMINOPHEN 500 MG PO TABS
1000.0000 mg | ORAL_TABLET | Freq: Once | ORAL | Status: AC
  Administered 2019-11-25: 1000 mg via ORAL
  Filled 2019-11-25: qty 2

## 2019-11-25 MED ORDER — MIDAZOLAM HCL 2 MG/2ML IJ SOLN
INTRAMUSCULAR | Status: AC
  Filled 2019-11-25: qty 2

## 2019-11-25 MED ORDER — MIDAZOLAM HCL 5 MG/5ML IJ SOLN
INTRAMUSCULAR | Status: DC | PRN
  Administered 2019-11-25: 2 mg via INTRAVENOUS

## 2019-11-25 MED ORDER — BUPIVACAINE HCL (PF) 0.5 % IJ SOLN
INTRAMUSCULAR | Status: AC
  Filled 2019-11-25: qty 30

## 2019-11-25 MED ORDER — SUGAMMADEX SODIUM 500 MG/5ML IV SOLN
INTRAVENOUS | Status: DC | PRN
Start: 2019-11-25 — End: 2019-11-25
  Administered 2019-11-25: 400 mg via INTRAVENOUS

## 2019-11-25 MED ORDER — ONDANSETRON HCL 4 MG/2ML IJ SOLN
INTRAMUSCULAR | Status: AC
  Filled 2019-11-25: qty 2

## 2019-11-25 MED ORDER — METHOCARBAMOL 500 MG PO TABS
500.0000 mg | ORAL_TABLET | Freq: Four times a day (QID) | ORAL | Status: DC | PRN
  Administered 2019-11-25 – 2019-11-26 (×3): 500 mg via ORAL
  Filled 2019-11-25 (×2): qty 1

## 2019-11-25 MED ORDER — DEXAMETHASONE SODIUM PHOSPHATE 10 MG/ML IJ SOLN
INTRAMUSCULAR | Status: AC
  Filled 2019-11-25: qty 1

## 2019-11-25 MED ORDER — ACETAMINOPHEN 325 MG PO TABS: 650.0000 mg | ORAL_TABLET | ORAL | Status: DC | PRN

## 2019-11-25 MED ORDER — PHENYLEPHRINE HCL-NACL 10-0.9 MG/250ML-% IV SOLN
INTRAVENOUS | Status: DC | PRN
  Administered 2019-11-25: 50 ug/min via INTRAVENOUS

## 2019-11-25 MED ORDER — FENTANYL CITRATE (PF) 250 MCG/5ML IJ SOLN
INTRAMUSCULAR | Status: AC
  Filled 2019-11-25: qty 5

## 2019-11-25 MED ORDER — BUPIVACAINE LIPOSOME 1.3 % IJ SUSP
INTRAMUSCULAR | Status: DC | PRN
  Administered 2019-11-25: 20 mL

## 2019-11-25 MED ORDER — GABAPENTIN 600 MG PO TABS
1200.0000 mg | ORAL_TABLET | Freq: Three times a day (TID) | ORAL | Status: DC
  Administered 2019-11-25 – 2019-11-26 (×3): 1200 mg via ORAL
  Filled 2019-11-25 (×3): qty 2

## 2019-11-25 MED ORDER — ASPIRIN EC 81 MG PO TBEC
81.0000 mg | DELAYED_RELEASE_TABLET | Freq: Every day | ORAL | Status: DC
  Administered 2019-11-26: 81 mg via ORAL
  Filled 2019-11-25: qty 1

## 2019-11-25 MED ORDER — DOCUSATE SODIUM 100 MG PO CAPS
100.0000 mg | ORAL_CAPSULE | Freq: Two times a day (BID) | ORAL | Status: DC
  Administered 2019-11-25 – 2019-11-26 (×2): 100 mg via ORAL
  Filled 2019-11-25 (×2): qty 1

## 2019-11-25 MED ORDER — HYDROCODONE-ACETAMINOPHEN 10-325 MG PO TABS
1.0000 | ORAL_TABLET | ORAL | Status: DC | PRN
  Administered 2019-11-25: 1 via ORAL
  Filled 2019-11-25: qty 1

## 2019-11-25 MED ORDER — ALUM & MAG HYDROXIDE-SIMETH 200-200-20 MG/5ML PO SUSP: 30.0000 mL | Freq: Four times a day (QID) | ORAL | Status: DC | PRN

## 2019-11-25 MED ORDER — PHENYLEPHRINE HCL (PRESSORS) 10 MG/ML IV SOLN
INTRAVENOUS | Status: DC | PRN
Start: 2019-11-25 — End: 2019-11-25
  Administered 2019-11-25 (×2): 80 ug via INTRAVENOUS
  Administered 2019-11-25: 120 ug via INTRAVENOUS
  Administered 2019-11-25: 80 ug via INTRAVENOUS

## 2019-11-25 MED ORDER — CEFAZOLIN SODIUM-DEXTROSE 2-4 GM/100ML-% IV SOLN
2.0000 g | Freq: Three times a day (TID) | INTRAVENOUS | Status: AC
  Administered 2019-11-25 (×2): 2 g via INTRAVENOUS
  Filled 2019-11-25 (×2): qty 100

## 2019-11-25 MED ORDER — BUPIVACAINE HCL 0.5 % IJ SOLN
INTRAMUSCULAR | Status: DC | PRN
  Administered 2019-11-25: 20 mL

## 2019-11-25 MED ORDER — ACETAMINOPHEN 650 MG RE SUPP: 650.0000 mg | RECTAL | Status: DC | PRN

## 2019-11-25 MED ORDER — ONDANSETRON HCL 4 MG PO TABS: 4.0000 mg | ORAL_TABLET | Freq: Four times a day (QID) | ORAL | Status: DC | PRN

## 2019-11-25 MED ORDER — EPHEDRINE 5 MG/ML INJ
INTRAVENOUS | Status: AC
  Filled 2019-11-25: qty 10

## 2019-11-25 MED ORDER — BISACODYL 5 MG PO TBEC: 5.0000 mg | DELAYED_RELEASE_TABLET | Freq: Every day | ORAL | Status: DC | PRN

## 2019-11-25 MED ORDER — ATORVASTATIN CALCIUM 10 MG PO TABS
20.0000 mg | ORAL_TABLET | Freq: Every day | ORAL | Status: DC
  Administered 2019-11-25: 20 mg via ORAL
  Filled 2019-11-25: qty 2

## 2019-11-25 MED ORDER — SUCCINYLCHOLINE CHLORIDE 200 MG/10ML IV SOSY
PREFILLED_SYRINGE | INTRAVENOUS | Status: AC
  Filled 2019-11-25: qty 10

## 2019-11-25 MED ORDER — PROPOFOL 10 MG/ML IV BOLUS
INTRAVENOUS | Status: DC | PRN
Start: 2019-11-25 — End: 2019-11-25
  Administered 2019-11-25: 260 mg via INTRAVENOUS

## 2019-11-25 MED ORDER — SUGAMMADEX SODIUM 500 MG/5ML IV SOLN
INTRAVENOUS | Status: AC
  Filled 2019-11-25: qty 5

## 2019-11-25 MED ORDER — ALBUMIN HUMAN 5 % IV SOLN
INTRAVENOUS | Status: DC | PRN
Start: 2019-11-25 — End: 2019-11-25

## 2019-11-25 MED ORDER — PROPOFOL 10 MG/ML IV BOLUS
INTRAVENOUS | Status: AC
  Filled 2019-11-25: qty 40

## 2019-11-25 MED ORDER — SODIUM CHLORIDE 0.9 % IV SOLN: INTRAVENOUS | Status: DC

## 2019-11-25 MED ORDER — ONDANSETRON HCL 4 MG/2ML IJ SOLN: 4.0000 mg | Freq: Four times a day (QID) | INTRAMUSCULAR | Status: DC | PRN

## 2019-11-25 MED ORDER — HYDROMORPHONE HCL 1 MG/ML IJ SOLN
INTRAMUSCULAR | Status: AC
  Filled 2019-11-25: qty 1

## 2019-11-25 MED ORDER — THROMBIN 20000 UNITS EX SOLR: CUTANEOUS | Status: DC | PRN

## 2019-11-25 MED ORDER — PROPRANOLOL HCL ER 60 MG PO CP24
60.0000 mg | ORAL_CAPSULE | Freq: Every day | ORAL | Status: DC
  Administered 2019-11-26: 60 mg via ORAL
  Filled 2019-11-25: qty 1

## 2019-11-25 MED ORDER — FENTANYL CITRATE (PF) 250 MCG/5ML IJ SOLN
INTRAMUSCULAR | Status: DC | PRN
  Administered 2019-11-25 (×5): 50 ug via INTRAVENOUS

## 2019-11-25 MED ORDER — ROCURONIUM BROMIDE 10 MG/ML (PF) SYRINGE
PREFILLED_SYRINGE | INTRAVENOUS | Status: AC
  Filled 2019-11-25: qty 10

## 2019-11-25 MED ORDER — HYDROCODONE-ACETAMINOPHEN 7.5-325 MG PO TABS
1.0000 | ORAL_TABLET | Freq: Four times a day (QID) | ORAL | Status: DC
  Administered 2019-11-25: 1 via ORAL
  Filled 2019-11-25: qty 1

## 2019-11-25 MED ORDER — SUCCINYLCHOLINE CHLORIDE 20 MG/ML IJ SOLN
INTRAMUSCULAR | Status: DC | PRN
  Administered 2019-11-25: 180 mg via INTRAVENOUS

## 2019-11-25 MED ORDER — HYDROMORPHONE HCL 1 MG/ML IJ SOLN
0.2500 mg | INTRAMUSCULAR | Status: DC | PRN
  Administered 2019-11-25 (×4): 0.5 mg via INTRAVENOUS

## 2019-11-25 MED ORDER — DICLOFENAC SODIUM 25 MG PO TBEC
50.0000 mg | DELAYED_RELEASE_TABLET | Freq: Three times a day (TID) | ORAL | Status: DC
  Administered 2019-11-26: 50 mg via ORAL
  Filled 2019-11-25 (×2): qty 1
  Filled 2019-11-25: qty 2
  Filled 2019-11-25: qty 1
  Filled 2019-11-25 (×2): qty 2

## 2019-11-25 MED ORDER — LIDOCAINE HCL (CARDIAC) PF 100 MG/5ML IV SOSY
PREFILLED_SYRINGE | INTRAVENOUS | Status: DC | PRN
  Administered 2019-11-25: 100 mg via INTRATRACHEAL

## 2019-11-25 MED ORDER — POLYETHYLENE GLYCOL 3350 17 G PO PACK: 17.0000 g | PACK | Freq: Every day | ORAL | Status: DC | PRN

## 2019-11-25 MED ORDER — GABAPENTIN 300 MG PO CAPS
300.0000 mg | ORAL_CAPSULE | Freq: Once | ORAL | Status: DC | PRN
  Filled 2019-11-25: qty 1

## 2019-11-25 MED ORDER — THROMBIN (RECOMBINANT) 20000 UNITS EX SOLR
CUTANEOUS | Status: AC
  Filled 2019-11-25: qty 20000

## 2019-11-25 MED ORDER — MEPERIDINE HCL 25 MG/ML IJ SOLN: 6.2500 mg | INTRAMUSCULAR | Status: DC | PRN

## 2019-11-25 MED ORDER — ROCURONIUM BROMIDE 10 MG/ML (PF) SYRINGE
PREFILLED_SYRINGE | INTRAVENOUS | Status: DC | PRN
  Administered 2019-11-25: 60 mg via INTRAVENOUS
  Administered 2019-11-25: 20 mg via INTRAVENOUS

## 2019-11-25 MED ORDER — 0.9 % SODIUM CHLORIDE (POUR BTL) OPTIME
TOPICAL | Status: DC | PRN
  Administered 2019-11-25: 1000 mL

## 2019-11-25 SURGICAL SUPPLY — 49 items
ADH SKN CLS APL DERMABOND .7 (GAUZE/BANDAGES/DRESSINGS) ×1
BUR SABER RD CUTTING 3.0 (BURR) IMPLANT
CANISTER SUCT 3000ML PPV (MISCELLANEOUS) ×2 IMPLANT
COVER SURGICAL LIGHT HANDLE (MISCELLANEOUS) ×2 IMPLANT
COVER WAND RF STERILE (DRAPES) ×2 IMPLANT
DERMABOND ADVANCED (GAUZE/BANDAGES/DRESSINGS) ×1
DERMABOND ADVANCED .7 DNX12 (GAUZE/BANDAGES/DRESSINGS) ×1 IMPLANT
DRAPE HALF SHEET 40X57 (DRAPES) IMPLANT
DRAPE INCISE IOBAN 66X45 STRL (DRAPES) IMPLANT
DRAPE MICROSCOPE LEICA (MISCELLANEOUS) ×2 IMPLANT
DRAPE SURG 17X23 STRL (DRAPES) ×8 IMPLANT
DRSG MEPILEX BORDER 4X4 (GAUZE/BANDAGES/DRESSINGS) IMPLANT
DRSG MEPILEX BORDER 4X8 (GAUZE/BANDAGES/DRESSINGS) ×1 IMPLANT
DURAPREP 26ML APPLICATOR (WOUND CARE) ×2 IMPLANT
ELECT REM PT RETURN 9FT ADLT (ELECTROSURGICAL) ×2
ELECTRODE REM PT RTRN 9FT ADLT (ELECTROSURGICAL) ×1 IMPLANT
EVACUATOR 1/8 PVC DRAIN (DRAIN) IMPLANT
GLOVE BIOGEL PI IND STRL 8 (GLOVE) ×1 IMPLANT
GLOVE BIOGEL PI INDICATOR 8 (GLOVE) ×1
GLOVE ECLIPSE 9.0 STRL (GLOVE) ×2 IMPLANT
GLOVE ORTHO TXT STRL SZ7.5 (GLOVE) ×2 IMPLANT
GLOVE SURG 8.5 LATEX PF (GLOVE) ×2 IMPLANT
GOWN STRL REUS W/ TWL LRG LVL3 (GOWN DISPOSABLE) ×1 IMPLANT
GOWN STRL REUS W/TWL 2XL LVL3 (GOWN DISPOSABLE) ×4 IMPLANT
GOWN STRL REUS W/TWL LRG LVL3 (GOWN DISPOSABLE) ×2
KIT BASIN OR (CUSTOM PROCEDURE TRAY) ×2 IMPLANT
KIT TURNOVER KIT B (KITS) ×2 IMPLANT
NDL SPNL 18GX3.5 QUINCKE PK (NEEDLE) ×2 IMPLANT
NEEDLE SPNL 18GX3.5 QUINCKE PK (NEEDLE) ×4 IMPLANT
NS IRRIG 1000ML POUR BTL (IV SOLUTION) ×2 IMPLANT
PACK LAMINECTOMY ORTHO (CUSTOM PROCEDURE TRAY) ×2 IMPLANT
PAD ARMBOARD 7.5X6 YLW CONV (MISCELLANEOUS) ×4 IMPLANT
PATTIES SURGICAL .5 X.5 (GAUZE/BANDAGES/DRESSINGS) IMPLANT
PATTIES SURGICAL .75X.75 (GAUZE/BANDAGES/DRESSINGS) IMPLANT
PATTIES SURGICAL 1X1 (DISPOSABLE) IMPLANT
SPONGE LAP 4X18 RFD (DISPOSABLE) IMPLANT
SPONGE SURGIFOAM ABS GEL 100 (HEMOSTASIS) IMPLANT
SUT VIC AB 0 CT1 27 (SUTURE)
SUT VIC AB 0 CT1 27XBRD ANBCTR (SUTURE) IMPLANT
SUT VIC AB 1 CT1 27 (SUTURE)
SUT VIC AB 1 CT1 27XBRD ANBCTR (SUTURE) IMPLANT
SUT VIC AB 2-0 CT1 27 (SUTURE)
SUT VIC AB 2-0 CT1 TAPERPNT 27 (SUTURE) IMPLANT
SUT VIC AB 3-0 X1 27 (SUTURE) ×2 IMPLANT
SUT VICRYL 0 UR6 27IN ABS (SUTURE) ×2 IMPLANT
TOWEL GREEN STERILE (TOWEL DISPOSABLE) ×2 IMPLANT
TOWEL GREEN STERILE FF (TOWEL DISPOSABLE) ×2 IMPLANT
TRAY FOLEY MTR SLVR 16FR STAT (SET/KITS/TRAYS/PACK) IMPLANT
WATER STERILE IRR 1000ML POUR (IV SOLUTION) ×2 IMPLANT

## 2019-11-25 NOTE — Op Note (Signed)
11/25/2019  11:08 AM  PATIENT:  Blake [name]  49 y.o. 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   PROCEDURE:The patient was met in the holding area, and the appropriate bilateral L4-5 identified and marked with "x" and my initials.The patient was then transported to OR and was placed under general anesthesia without difficulty. The patient received appropriate preoperative antibiotic prophylaxis. Foley catheter was placed sterilely. The patient after intubation atraumatically was transferred to the operating room table, prone position, Wilson frame, sliding OR table. All pressure points were well padded. The arms in 90-90 well-padded at the elbows. Standard prep with DuraPrep solution lower dorsal spine to the mid sacral segment. Draped in the usual manner iodine Vi-Drape was used. Time-out procedure was called and correct. 2x 18-gauge spinal needle was then inserted at the expected L4-5 level. Cross table lateral radiograph was draped sterilely to the field and used to identify the spinal needles positions. The needle was at the lower aspect of the lamina of L4 Skin inferior to this was then infiltrated with Marcaine 0.5% 1:1 exparel 1.3% total of 20 cc used. An incision approximately two inches in length was then made through skin and subcutaneous layers in the midline just superior to the lower spinal needle entry point. An incision made into the bilateral lumbosacral fascia approximately an inch and a half in  length .  Cobb elevator was then introduced into the incision site and used to carefully form subperiosteal movement of the bilateral paralumbar muscles off of the posterior lamina of the expected L4-5 level. The depth measured off of the elevator at about 90 mm and 90 mm retractors and placed on the scaffolding for the [facility] equipment and guided over dilators down to and docking on the posterior aspect of the lamina at the expected L4-5 level. Cross table radiograph was identified a Kocher clamp at the superior L5 spinous process. The operating room microscope sterilely draped brought into the field. Under the operating room microscope, the L4-5 interspace carefully debrided the small amount of muscle attachment here and high-speed bur used to drill the medial aspect of the inferior articular process of L4 bilaterally  Approximately 20%. The spinous process of L4 then marked with zero vicryl suture. The right laminotomy then carried out using a 3 and 4 mm Kerrison to debride the inferior margin of L4 and the ligamentum flavum attachment there. The ligamentum flavum the resected with kerrisons off the medial right L4-5 facet. Foraminotomy was then performed over the L5 nerve root. The medial 10% superior articular process of L5 and then resected using a 3 mm Kerrison. This allowed for identification of the thecal sac. Penfield 4 was then used to carefully mobilize the thecal sac medially and the L5 nerve root identified within the lateral recess with an underlying posterior disc protrusion. Carefully the lateral aspect of the L5 nerve root was identified and a Penfield 4 was used to mobilize the nerve medially such that the disc was visible with microscope.  Further foraminotomies was  performed over the L4 nerve root the nerve root was noted to be decompressed. The nerve root able to be retracted along the medial aspect of the L5 pedicle and no disc herniation was found. Gel foam thrombin soaked and  cottonoid placed. The left L4-5 ligamentum flavum was further debrided. Had a moderate amount of further resection of the left L4 lamina inferiorly was performed. The left sided L4-5 laminotomy was the performed with the use of the OR microscope. The disc space at left L4-5 was easily visualized. Retracting the left lateral thecal sac at L4-5 the left L4-5 disc noted to be herniated greater than on the right. Protecting the left L5 nerve root the medial reflected portion of the L4-5 ligmametum flavum attachement to the facet was resected.  Ligamentum flavum was debrided and lateral recess along the medial aspect L4-5 facet no further decompression was necessary. Ball tip nerve probe was then able to carefully palpate the neuroforamen for L4 and L5 finding these to be well decompressed. Attention then turned to the right L4-5 level which was easily visualized with the microscope.The lateral recess right L4-5 decompressed using 2 and 3 mm Kerrisons sizing hypertrophic reflected ligamentum flavum extending superiorly. From was resected off the ventral aspect of the inferior margin of the L4 lamina. Hockey-stick nerve probe could then be passed out the L4 neuroforamen and the L5 neuroforamen. Venous bleeding encountered. Thrombin-soaked Gelfoam used to control this following this then the sac and the L5 nerve root were mobilized medially and the L4-5 disc examined and no disc herniation was found. Irrigation was carried out down to this bleeding controlled with Gelfoam. Gelfoam was then removed. Irrigation carried careful examination demonstrated no active bleeding present. Retractors were then carefully removed Since carefully then the Bleeding was then controlled using thrombin-soaked Gelfoam small cottonoids. Small amount of bleeding within the soft tissue mass the laminotomy area was controlled using bipolar electrocautery. Irrigation was carried out using copious amounts of irrigant solution. All Gelfoam were then  removed. No significant active bleeding present at the time of removal. All instruments sponge counts were correct, traction system was then carefully removed with only bipolar electrocautery of any small bleeders. Lumbodorsal fascia was then carefully approximated with interrupted 0 Vicryl sutures, UR 6 needle deep subcutaneous layers were approximated with interrupted 0 Vicryl sutures on UR 6 the appear subcutaneous layers approximated with interrupted 2-0 Vicryl sutures and the skin closed with a running subcutaneous stitch of 4-0 Vicryl. Dermabond was applied allowed to dry and then Mepilex bandage applied. Patient was then carefully returned to supine position on a stretcher, reactivated and extubated. He was then returned to recovery room in satisfactory condition.  Benjiman Core, PA-C perform the duties of assistant surgeon during this case. he was present from the beginning of the case to the end of the case assisting in transfer the patient from his stretcher to the OR table and back to the stretcher at the end of the case. Assisted in careful retraction and suction of the laminectomy site delicate neural structures operating under the operating room microscope. He performed closure of the incision from the fascia to the skin applying the dressing.     Basil Dess  11/25/2019, 11:08 AM

## 2019-11-25 NOTE — Anesthesia Procedure Notes (Signed)
Procedure Name: Intubation Date/Time: 11/25/2019 7:53 AM Performed by: Scheryl Darter, CRNA Pre-anesthesia Checklist: Patient identified, Emergency Drugs available, Suction available and Patient being monitored Patient Re-evaluated:Patient Re-evaluated prior to induction Oxygen Delivery Method: Circle System Utilized Preoxygenation: Pre-oxygenation with 100% oxygen Induction Type: IV induction Ventilation: Mask ventilation without difficulty Laryngoscope Size: Mac and 4 Grade View: Grade II Tube type: Oral Tube size: 7.5 mm Number of attempts: 1 Airway Equipment and Method: Stylet and Oral airway Placement Confirmation: ETT inserted through vocal cords under direct vision,  positive ETCO2 and breath sounds checked- equal and bilateral Secured at: 23 cm Tube secured with: Tape Dental Injury: Teeth and Oropharynx as per pre-operative assessment

## 2019-11-25 NOTE — Anesthesia Postprocedure Evaluation (Signed)
Anesthesia Post Note  Patient: Blake [name]  Procedure(s) Performed: BILATERAL PARTIAL LUMBAR HEMILAMINECTOMIES LUMBAR FOUR-FIVE (N/A Spine Lumbar)     Patient location during evaluation: PACU Anesthesia Type: General Level of consciousness: awake and alert Pain management: pain level controlled Vital Signs Assessment: post-procedure vital signs reviewed and stable Respiratory status: spontaneous breathing, nonlabored ventilation, respiratory function stable and patient connected to nasal cannula oxygen Cardiovascular status: blood pressure returned to baseline and stable Postop Assessment: no apparent nausea or vomiting Anesthetic complications: no    Last Vitals:  Vitals:   11/25/19 1238 11/25/19 1603  BP: 111/77 (!) 107/50  Pulse: 67 69  Resp: 19 19  Temp: 36.9 C 36.9 C  SpO2: 96% 93%    Last Pain:  Vitals:   11/25/19 1603  TempSrc: Oral  PainSc:                  Osric Klopf COKER

## 2019-11-25 NOTE — Discharge Instructions (Addendum)
    No lifting greater than 10 lbs. Avoid bending, stooping and twisting. Walk in house for first week them may start to get out slowly increasing distance up to one mile by 3 weeks post op. Keep incision dry for 3 days, may use tegaderm or similar water impervious dressing. No driving.  Ok to shower 5 days postop.  No tub soaking.  Do not apply any creams or ointments to incision.

## 2019-11-25 NOTE — Interval H&P Note (Signed)
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  The various methods of treatment have been discussed with the patient and family. After consideration of risks, benefits and other options for treatment, the patient has consented to  Procedure(s): BILATERAL PARTIAL LUMBAR HEMILAMINECTOMIES L4-5 (N/A) as a surgical intervention.  The patient's history has been reviewed, patient examined, no change in status, stable for surgery.  I have reviewed the patient's chart and labs.  Questions were answered to the patient's satisfaction.     Basil Dess

## 2019-11-25 NOTE — Transfer of Care (Signed)
Immediate Anesthesia Transfer of Care Note  Patient: Blake [name]  Procedure(s) Performed: BILATERAL PARTIAL LUMBAR HEMILAMINECTOMIES LUMBAR FOUR-FIVE (N/A Spine Lumbar)  Patient Location: PACU  Anesthesia Type:General  Level of Consciousness: awake, alert , oriented and sedated  Airway & Oxygen Therapy: Patient Spontanous Breathing and Patient connected to face mask oxygen  Post-op Assessment: Report given to RN, Post -op Vital signs reviewed and stable and Patient moving all extremities  Post vital signs: vital signs stable  Last Vitals:  Vitals Value Taken Time  BP 102/69 11/25/19 1100  Temp    Pulse 80 11/25/19 1103  Resp 20 11/25/19 1103  SpO2 92 % 11/25/19 1103  Vitals shown include unvalidated device data.  Last Pain:  Vitals:   11/25/19 0653  TempSrc:   PainSc: 6       Patients Stated Pain Goal: 3 ([id number])  Complications: No apparent anesthesia complications

## 2019-11-25 NOTE — H&P (Signed)
Blake [name] is an 49 y.o. male.   Chief Complaint: back pain and leg pain HPI: 49 year old black male history of L4-5 stenosis, back pain and lower extremity radiculopathy comes in for preop evaluation.  States that symptoms unchanged from previous visit.  He is wanting to proceed with L4-5 decompression as scheduled.   Past Medical History:  Diagnosis Date  . Arthritis   . Back pain   . Dilated aortic root (HCC)    70mm ascending aorta by echo 09/2017, see 08-2018 echo in epic for newer results   . ERECTILE DYSFUNCTION, MILD 11/12/2007   Qualifier: Diagnosis of  By: Carolyne Littles    . Head injury with loss of consciousness ([place]) 1997   unconsciousness  . Heart murmur    as a child  . History of blood transfusion    1997, received 15 units of blood   . Morbid obesity ([place])   . Neck pain   . PVC's (premature ventricular contractions)   . TOBACCO ABUSE 11/12/2007   Qualifier: Diagnosis of  By: Carmie End MD, Junie Panning    . Tremor    head and hands , controlled on cymbalta    Past Surgical History:  Procedure Laterality Date  . FACIAL FRACTURE SURGERY  09/1995   left side of face crush injury, carnial fracture   . 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 COPD Mother   . Leukemia Father   . Hypertension Sister   . Diabetes Sister    Social History:  reports that he has been smoking. He has a 16.50 pack-year smoking history. He has never used smokeless tobacco. He reports current alcohol use of about 1.0 standard drinks of alcohol per week. He reports that he does not use drugs.  Allergies: No Known Allergies  Medications Prior to Admission  Medication Sig Dispense Refill  . acetaminophen (TYLENOL) 500 MG tablet Take 500-1,000 mg by mouth every 6 (six) hours as needed (for pain.).     Marland Kitchen aspirin EC 81 MG tablet Take 1 tablet (81 mg total) by mouth daily. 90 tablet 3  . atorvastatin (LIPITOR) 20 MG tablet Take 1 tablet (20 mg total) by mouth daily. 90 tablet 3  . diclofenac (VOLTAREN) 50 MG EC tablet Take 1 tablet (50 mg total) by mouth 3 (three) times daily. 180 tablet 3  . gabapentin (NEURONTIN) 600 MG tablet Take 2 tablets (1,200 mg total) by mouth 3 (three) times daily. 180 tablet 0  . HYDROcodone-acetaminophen (NORCO/VICODIN) 5-325 MG tablet Take 1 tablet by mouth every 6 (six) hours as needed for moderate pain. 30 tablet 0  . meloxicam (MOBIC) 15 MG tablet Take 1 tablet (15 mg total) by mouth daily. 30 tablet 3  . oxaprozin (DAYPRO) 600 MG tablet Take 1 tablet (600 mg total) by mouth daily. 30 tablet 3  . propranolol ER (INDERAL LA) 60 MG 24 hr capsule Take 1 capsule (60 mg total) by mouth daily. 30 capsule 3  . sildenafil (VIAGRA) 100 MG tablet Take 0.5-1 tablets (50-100 mg total) by mouth daily as needed for erectile dysfunction. 10 tablet 3  . doxycycline (VIBRAMYCIN) 100 MG capsule Take 1 capsule (100 mg total) by mouth 2 (two) times daily. (Patient not taking: Reported on 11/12/2019) 14 capsule 0  . metoprolol tartrate (LOPRESSOR)  100 MG tablet Take 1 tablet (100 mg total) by mouth once for 1 dose. Take one tablet two hours prior to CT scan. (Patient not taking: Reported on 11/12/2019) 1 tablet 0    No results found for this or any previous visit (from the past 48 hour(s)). No results found.  Review of Systems  Constitutional: Positive for activity change.  HENT: Negative.   Respiratory: Negative.   Cardiovascular: Negative.   Gastrointestinal: Negative.   Genitourinary: Negative.   Musculoskeletal: Positive for back pain.  Neurological: Positive for numbness.  Psychiatric/Behavioral: Negative.     Blood pressure 127/79, pulse 87, temperature 99.1 F (37.3 C), temperature source Oral, resp. rate 18, height 6\' 2"  (1.88 m), weight (!) 167.4 kg, SpO2 96  %. Physical Exam  Constitutional: He is oriented to person, place, and time. He appears well-nourished. No distress.  HENT:  Head: Normocephalic and atraumatic.  Eyes: Pupils are equal, round, and reactive to light. EOM are normal.  Cardiovascular: Normal heart sounds.  Respiratory: Effort normal and breath sounds normal. No respiratory distress.  GI: He exhibits no distension.  Musculoskeletal:        General: Tenderness present.     Cervical back: Normal range of motion.  Neurological: He is alert and oriented to person, place, and time.  Skin: Skin is warm and dry.  Psychiatric: He has a normal mood and affect.     Assessment/Plan L4-5 stenosis  We will proceed with surgery as scheduled  Procedure discussed along with possible hospital stay.  All questions answered and he wishes to proceed.    Benjiman Core, PA-C 11/25/2019, 6:15 AM

## 2019-11-25 NOTE — Brief Op Note (Signed)
11/25/2019  10:58 AM  PATIENT:  [name]  49 y.o. 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   Delay start of Pharmacological VTE agent (>24hrs) due to surgical blood loss or risk of bleeding: yes

## 2019-11-26 LAB — BASIC METABOLIC PANEL
Anion gap: 9 (ref 5–15)
BUN: 11 mg/dL (ref 6–20)
CO2: 22 mmol/L (ref 22–32)
Calcium: 8.6 mg/dL — ABNORMAL LOW (ref 8.9–10.3)
Chloride: 105 mmol/L (ref 98–111)
Creatinine, Ser: 0.95 mg/dL (ref 0.61–1.24)
GFR calc Af Amer: >60 mL/min (ref >60)
GFR calc non Af Amer: >60 mL/min (ref >60)
Glucose, Bld: 117 mg/dL — ABNORMAL HIGH (ref 70–99)
Potassium: 4.5 mmol/L (ref 3.5–5.1)
Sodium: 136 mmol/L (ref 135–145)

## 2019-11-26 LAB — CBC
HCT: 45.4 % (ref 39.0–52.0)
Hemoglobin: 14.8 g/dL (ref 13.0–17.0)
MCH: 31.8 pg (ref 26.0–34.0)
MCHC: 32.6 g/dL (ref 30.0–36.0)
MCV: 97.4 fL (ref 80.0–100.0)
Platelets: 242 10*3/uL (ref 150–400)
RBC: 4.66 MIL/uL (ref 4.22–5.81)
RDW: 14.9 % (ref 11.5–15.5)
WBC: 13.1 10*3/uL — ABNORMAL HIGH (ref 4.0–10.5)
nRBC: 0 % (ref 0.0–0.2)

## 2019-11-26 MED ORDER — METHOCARBAMOL 500 MG PO TABS: 500.0000 mg | ORAL_TABLET | Freq: Four times a day (QID) | ORAL | 0 refills | Status: DC | PRN

## 2019-11-26 MED ORDER — OXYCODONE-ACETAMINOPHEN 7.5-325 MG PO TABS: 1.0000 | ORAL_TABLET | ORAL | 0 refills | Status: DC | PRN

## 2019-11-26 MED FILL — Thrombin (Recombinant) For Soln 20000 Unit: CUTANEOUS | Qty: 1 | Status: AC

## 2019-11-26 NOTE — Discharge Summary (Signed)
Physician Discharge Summary      Patient ID: Blake [name] MRN: [id number] DOB/AGE: 49-Dec-1972 48 y.o.  Admit date: 11/25/2019 Discharge date: 11/26/2019  Admission Diagnoses:  Principal Problem:   Spinal stenosis, lumbar region with neurogenic claudication Active Problems:   Epidural lipomatosis   Spinal stenosis of lumbar region with neurogenic claudication   Discharge Diagnoses:  Same  Past Medical History:  Diagnosis Date  . Arthritis   . Back pain   . Dilated aortic root (HCC)    90mm ascending aorta by echo 09/2017, see 08-2018 echo in epic for newer results   . ERECTILE DYSFUNCTION, MILD 11/12/2007   Qualifier: Diagnosis of  By: Carolyne Littles    . Head injury with loss of consciousness ([facility]) 1997   unconsciousness  . Heart murmur    as a child  . History of blood transfusion    1997, received 15 units of blood   . Morbid obesity ([facility])   . Neck pain   . PVC's (premature ventricular contractions)   . TOBACCO ABUSE 11/12/2007   Qualifier: Diagnosis of  By: Carmie End MD, Junie Panning    . Tremor    head and hands , controlled on cymbalta    Surgeries: Procedure(s): BILATERAL PARTIAL LUMBAR HEMILAMINECTOMIES LUMBAR FOUR-FIVE on 11/25/2019   Consultants:   Discharged Condition: Improved  Hospital Course: Blake [name] is an 49 y.o. male who was admitted 11/25/2019 with a chief complaint of No chief complaint on file. , and found to have a diagnosis of Spinal stenosis, lumbar region with neurogenic claudication.  He was  brought to the operating room on 11/25/2019 and underwent the above named procedures.    He was given perioperative antibiotics:  Anti-infectives (From admission, onward)   Start     Dose/Rate Route Frequency Ordered Stop   11/25/19 1600  ceFAZolin (ANCEF) IVPB 2g/100 mL premix     2 g 200 mL/hr over 30 Minutes Intravenous Every 8 hours 11/25/19 1237 11/25/19 2327   11/25/19 0600  ceFAZolin (ANCEF) 3 g in dextrose 5 % 50 mL IVPB     3 g 100 mL/hr over 30  Minutes Intravenous On call to O.R. 11/22/19 0751 11/25/19 0800    He recovered in the PACU uneventfully and was discharged to [facility] med surg with recovery post op. He had a in and out catheterization at the end of the surgical case and was able to begin voiding without difficulty post op. He was able to stand and ambulate with assistance on the evening of surgery and the was walking in the morning of post op day #1. POD#1 he was awake alert and oriented x 4. Dressing was dry and was changed. His neurologic exam was normal. He was discharged home on POD#1. Advised not to take the amount of NSAIDs he had been using in the past which included meloxicam, oxaprazin, and diclofenac. He will walk for exercise. He was given sequential compression devices, early ambulation, and chemoprophylaxis for DVT prophylaxis.  He benefited maximally from their hospital stay and there were no complications.    Recent vital signs:  Vitals:   11/26/19 0418 11/26/19 0752  BP: 120/77 139/60  Pulse: 66 67  Resp: 20 17  Temp: 98 F (36.7 C) 98.8 F (37.1 C)  SpO2: 99% 96%    Recent laboratory studies:  Results for orders placed or performed during the hospital encounter of 11/25/19  CBC  Result Value Ref Range   WBC 13.1 (H) 4.0 - 10.5  K/uL   RBC 4.66 4.22 - 5.81 MIL/uL   Hemoglobin 14.8 13.0 - 17.0 g/dL   HCT 45.4 39.0 - 52.0 %   MCV 97.4 80.0 - 100.0 fL   MCH 31.8 26.0 - 34.0 pg   MCHC 32.6 30.0 - 36.0 g/dL   RDW 14.9 11.5 - 15.5 %   Platelets 242 150 - 400 K/uL   nRBC 0.0 0.0 - 0.2 %  Basic Metabolic Panel  Result Value Ref Range   Sodium 136 135 - 145 mmol/L   Potassium 4.5 3.5 - 5.1 mmol/L   Chloride 105 98 - 111 mmol/L   CO2 22 22 - 32 mmol/L   Glucose, Bld 117 (H) 70 - 99 mg/dL   BUN 11 6 - 20 mg/dL   Creatinine, Ser 0.95 0.61 - 1.24 mg/dL   Calcium 8.6 (L) 8.9 - 10.3 mg/dL   GFR calc non Af Amer >60 >60 mL/min   GFR calc Af Amer >60 >60 mL/min   Anion gap 9 5 - 15    Discharge  Medications:   Allergies as of 11/26/2019   No Known Allergies     Medication List    STOP taking these medications   diclofenac 50 MG EC tablet Commonly known as: VOLTAREN   doxycycline 100 MG capsule Commonly known as: VIBRAMYCIN   HYDROcodone-acetaminophen 5-325 MG tablet Commonly known as: NORCO/VICODIN   meloxicam 15 MG tablet Commonly known as: MOBIC   metoprolol tartrate 100 MG tablet Commonly known as: LOPRESSOR   oxaprozin 600 MG tablet Commonly known as: Daypro     TAKE these medications   aspirin EC 81 MG tablet Take 1 tablet (81 mg total) by mouth daily.   atorvastatin 20 MG tablet Commonly known as: LIPITOR Take 1 tablet (20 mg total) by mouth daily.   gabapentin 600 MG tablet Commonly known as: Neurontin Take 2 tablets (1,200 mg total) by mouth 3 (three) times daily.   methocarbamol 500 MG tablet Commonly known as: Robaxin Take 1 tablet (500 mg total) by mouth every 6 (six) hours as needed for muscle spasms.   oxyCODONE-acetaminophen 7.5-325 MG tablet Commonly known as: Percocet Take 1 tablet by mouth every 4 (four) hours as needed for severe pain.   propranolol ER 60 MG 24 hr capsule Commonly known as: INDERAL LA Take 1 capsule (60 mg total) by mouth daily.   sildenafil 100 MG tablet Commonly known as: Viagra Take 0.5-1 tablets (50-100 mg total) by mouth daily as needed for erectile dysfunction.       Diagnostic Studies: DG Chest 2 View  Result Date: 11/22/2019 CLINICAL DATA:  Preop testing. Additional history provided: Pre-admission for lumbar surgery. EXAM: CHEST - 2 VIEW COMPARISON:  Report from prior chest radiograph 12/05/2001 (images unavailable). FINDINGS: There is mild linear atelectasis and/or scarring within the left lung base. Lungs otherwise clear. No evidence of pleural effusion or pneumothorax. Heart size within normal limits. Aortic atherosclerosis. No acute bony abnormality identified. Thoracic spondylosis. IMPRESSION: Mild  linear atelectasis and/or scarring within the left lung base. Lungs otherwise clear. Aortic Atherosclerosis (ICD10-I70.0). Electronically Signed   By: Kellie Simmering DO   On: 11/22/2019 08:11   DG Lumbar Spine 2-3 Views  Result Date: 11/25/2019 CLINICAL DATA:  Portable lateral lumbar spine imaging for surgical localization. EXAM: LUMBAR SPINE - 2-3 VIEW COMPARISON:  04/18/2019 FINDINGS: Initial image shows 2 spinal needles, the more superior projecting over the L3 spinous process, 4.8 cm posterior to the posterior margin of the L3  vertebra. The more inferior needle projects posterior to the posterior margin of the L4 spinous process, 7.3 cm posterior to the lower posterior margin of the L4 vertebra. Second image shows placement of a surgical probe, tip projecting 3.4 cm posterior to the posterior mid aspect of the L5 vertebral body. IMPRESSION: Surgical localization imaging as detailed. Electronically Signed   By: Lajean Manes M.D.   On: 11/25/2019 11:48   XR Thoracic Spine 2 View  Result Date: 11/06/2019 AP and lateral radiographs show anterior bridging syndesmophytes across the mid and lower thoracic spine with disc narrowing. The osteophytes are large and represent Diffuse Idiopathic Spinal Hyperostosis. The osteophytes are primarily right sided and anterior and coalescing. No acute finding.   Disposition: Discharge disposition: 01-Home or Self Care       Discharge Instructions    Ambulatory referral to Physical Therapy   Complete by: As directed       Follow-up Information    Jessy Oto, MD In 2 weeks.   Specialty: Orthopedic Surgery Why: For wound re-check Contact information: [place] [address] [phone]            Signed: Basil Dess 11/26/2019, 8:05 PM

## 2019-11-26 NOTE — Plan of Care (Signed)
Patient alert and oriented, mae's well, voiding adequate amount of urine, swallowing without difficulty, no c/o pain at time of discharge. Patient discharged home with family. Script and discharged instructions given to patient. Patient and family stated understanding of instructions given. Patient has an appointment with Dr. Nitka 

## 2019-11-26 NOTE — Progress Notes (Signed)
Subjective: Patient doing well.  Pain controlled.  No voiding issues.  Has ambulated without difficulty.  Ready to go home.    Objective: Vital signs in last 24 hours: Temp:  [97 F (36.1 C)-98.8 F (37.1 C)] 98.8 F (37.1 C) (05/18 0752) Pulse Rate:  [66-84] 67 (05/18 0752) Resp:  [13-20] 17 (05/18 0752) BP: (102-140)/(50-94) 139/60 (05/18 0752) SpO2:  [92 %-99 %] 96 % (05/18 0752)  Intake/Output from previous day: 05/17 0701 - 05/18 0700 In: 2620 [P.O.:720; I.V.:1600; IV Piggyback:300] Out: 1130 [Urine:1000; Blood:130] Intake/Output this shift: Total I/O In: 600 [P.O.:600] Out: -   Recent Labs    11/26/19 0640  HGB 14.8   Recent Labs    11/26/19 0640  WBC 13.1*  RBC 4.66  HCT 45.4  PLT 242   Recent Labs    11/26/19 0640  NA 136  K 4.5  CL 105  CO2 22  BUN 11  CREATININE 0.95  GLUCOSE 117*  CALCIUM 8.6*   No results for input(s): LABPT, INR in the last 72 hours.  Exam: Pleasant male, alert and oriented. NAD. Dressing c/d/i.  New dressing applied by RN.  Neurologically intact.     Assessment/Plan: Will d/c home today.  Scripts sent in for percocet and robaxin.  Patient's medication list shows that he has been taking at times 4 different NSAID's. I advised patient that he absolutely should NOT be doing this and made him aware of the risks and complications involved with doing so.  He stated that he has had some issue with lower GI bleeding in the past but nothing recent.  Follow up with Dr [name] as scheduled.     Benjiman Core 11/26/2019, 9:30 AM

## 2019-11-26 NOTE — Evaluation (Signed)
Physical Therapy Evaluation Patient Details Name: Blake [name] MRN: [id number] DOB: 11-Mar-1971 Today's Date: 11/26/2019   History of Present Illness  Patient is a 49 y/o male who presents s/p bil partial hemilaminectomies L4-5 11/25/19. PMH includes tremor, tobacco abuse, PVCs, dilated aortic root.  Clinical Impression  Patient presents with pain and post surgical deficits s/p above surgery. Pt reports being independent with ADLs PTA. Today, pt tolerated bed mobility, transfers, gait training and stair training with supervision for safety. Initially requiring min guard for ambulation due to pain but this improved with use of RW for support. Encouraged pt to use RW at home for stability. Reports worsened pain post ambulation. Education re: back precautions, positioning, log roll technique, walking program etc. Will follow acutely to maximize independence and mobility prior to return home.    Follow Up Recommendations Follow surgeon's recommendation for DC plan and follow-up therapies;Supervision - Intermittent(when medically cleared by MD)    Equipment Recommendations  None recommended by PT    Recommendations for Other Services       Precautions / Restrictions Precautions Precautions: Back Precaution Booklet Issued: Yes (comment) Precaution Comments: reviewed handout and precautions Restrictions Weight Bearing Restrictions: No      Mobility  Bed Mobility Overal bed mobility: Needs Assistance Bed Mobility: Rolling;Sidelying to Sit;Sit to Sidelying Rolling: Modified independent (Device/Increase time) Sidelying to sit: Modified independent (Device/Increase time)     Sit to sidelying: Modified independent (Device/Increase time) General bed mobility comments: Good demo of log roll technique without use of rail and HOB mostly flat.  Transfers Overall transfer level: Modified independent Equipment used: None             General transfer comment: Slow to rise due to pain, no  assist needed.  Ambulation/Gait Ambulation/Gait assistance: Min guard;Supervision Gait Distance (Feet): 400 Feet Assistive device: Rolling walker (2 wheeled);None Gait Pattern/deviations: Step-through pattern;Decreased stance time - right;Decreased step length - left;Antalgic Gait velocity: decreased Gait velocity interpretation: <1.31 ft/sec, indicative of household ambulator General Gait Details: Slow, antalgic like gait due to hx of hip replacement; initially guarded with decreased step lengths bilaterally staying close to rail; provided RW and gait sequence/balance improved. Reliance of UEs on RW for support.  Stairs Stairs: Yes Stairs assistance: Min guard Stair Management: One rail Right Number of Stairs: 5 General stair comments: Cues for technique and safety.  Wheelchair Mobility    Modified Rankin (Stroke Patients Only)       Balance Overall balance assessment: Mild deficits observed, not formally tested                                           Pertinent Vitals/Pain Pain Assessment: Faces Faces Pain Scale: Hurts even more Pain Location: back Pain Descriptors / Indicators: Sore;Operative site guarding;Grimacing;Guarding Pain Intervention(s): Repositioned;Monitored during session    [name] expects to be discharged to:: Private residence Living Arrangements: Alone Available Help at Discharge: Family;Available PRN/intermittently Type of Home: Apartment Home Access: Stairs to enter Entrance Stairs-Rails: Can reach both Entrance Stairs-Number of Steps: 4 Home Layout: One level Home Equipment: Walker - 2 wheels;Cane - single point      Prior Function Level of Independence: Independent         Comments: uses the cane when walking lately     Hand Dominance   Dominant Hand: Right    Extremity/Trunk Assessment   Upper Extremity Assessment  Upper Extremity Assessment: Defer to OT evaluation    Lower Extremity  Assessment Lower Extremity Assessment: Overall WFL for tasks assessed(Hx of hip replacement on RLE)    Cervical / Trunk Assessment Cervical / Trunk Assessment: Other exceptions Cervical / Trunk Exceptions: s/p back surgery  Communication   Communication: No difficulties  Cognition Arousal/Alertness: Awake/alert Behavior During Therapy: WFL for tasks assessed/performed Overall Cognitive Status: Within Functional Limits for tasks assessed                                        General Comments General comments (skin integrity, edema, etc.): Dressing/incision-clean, dry and intact. pt reporting numbness in LE post walking. MD aware.    Exercises     Assessment/Plan    PT Assessment Patient needs continued PT services  PT Problem List Decreased mobility;Decreased knowledge of precautions;Decreased activity tolerance;Pain;Impaired sensation;Decreased balance;Decreased skin integrity       PT Treatment Interventions Therapeutic activities;Gait training;Stair training;Balance training;Therapeutic exercise;Patient/family education    PT Goals (Current goals can be found in the Care Plan section)  Acute Rehab PT Goals Patient Stated Goal: less pain PT Goal Formulation: With patient Time For Goal Achievement: 12/10/19 Potential to Achieve Goals: Good    Frequency Min 5X/week   Barriers to discharge Decreased caregiver support lives alone    Co-evaluation               AM-PAC PT "6 Clicks" Mobility  Outcome Measure Help needed turning from your back to your side while in a flat bed without using bedrails?: None Help needed moving from lying on your back to sitting on the side of a flat bed without using bedrails?: None Help needed moving to and from a bed to a chair (including a wheelchair)?: None Help needed standing up from a chair using your arms (e.g., wheelchair or bedside chair)?: None Help needed to walk in hospital room?: A Little Help needed  climbing 3-5 steps with a railing? : A Little 6 Click Score: 22    End of Session   Activity Tolerance: Patient limited by pain;Patient tolerated treatment well Patient left: in bed;with call bell/phone within reach;Other (comment)(sitting EOB) Nurse Communication: Mobility status PT Visit Diagnosis: Pain;Difficulty in walking, not elsewhere classified (R26.2) Pain - part of body: (back)    Time: [id number] PT Time Calculation (min) (ACUTE ONLY): 22 min   Charges:   PT Evaluation $PT Eval Moderate Complexity: 1 Mod          Marisa Severin, PT, DPT Acute Rehabilitation Services Pager [phone] Office [phone]      [place] 11/26/2019, 11:18 AM

## 2019-11-26 NOTE — Evaluation (Signed)
Occupational Therapy Evaluation Patient Details Name: Blake [name] MRN: [id number] DOB: Dec 22, 1970 Today's Date: 11/26/2019    History of Present Illness Patient is a 49 y/o male who presents s/p bil partial hemilaminectomies L4-5 11/25/19. PMH includes tremor, tobacco abuse, PVCs, dilated aortic root.   Clinical Impression   Patient evaluated by Occupational Therapy with no further acute OT needs identified. All education has been completed and the patient has no further questions. See below for any follow-up Occupational Therapy or equipment needs. OT to sign off. Thank you for referral.      Follow Up Recommendations  No OT follow up    Equipment Recommendations  None recommended by OT    Recommendations for Other Services       Precautions / Restrictions Precautions Precautions: Back      Mobility Bed Mobility Overal bed mobility: Modified Independent             General bed mobility comments: educated on sequence to maintain log rolling. pt completed task a second time with much improved sequence  Transfers Overall transfer level: Modified independent                    Balance                                           ADL either performed or assessed with clinical judgement   ADL Overall ADL's : Modified independent                                       General ADL Comments: able to figure 4 cross and fix socks during session   Back handout provided and reviewed adls in detail. Pt educated on:  avoid sitting for long periods of time, correct bed positioning for sleeping, correct sequence for bed mobility, avoiding lifting more than 5 pounds and never wash directly over incision. All education is complete and patient indicates understanding.    Vision Baseline Vision/History: No visual deficits       Perception     Praxis      Pertinent Vitals/Pain Pain Assessment: No/denies pain     Hand Dominance  Right   Extremity/Trunk Assessment Upper Extremity Assessment Upper Extremity Assessment: Overall WFL for tasks assessed   Lower Extremity Assessment Lower Extremity Assessment: Defer to PT evaluation   Cervical / Trunk Assessment Cervical / Trunk Assessment: Other exceptions(s/p surg)   Communication Communication Communication: No difficulties   Cognition Arousal/Alertness: Awake/alert Behavior During Therapy: WFL for tasks assessed/performed Overall Cognitive Status: Within Functional Limits for tasks assessed                                     General Comments  dressing is wet and bandage is loose. Rn notified after session     Exercises     Shoulder Instructions      Home Living Family/patient expects to be discharged to:: Private residence Living Arrangements: Alone Available Help at Discharge: Family;Available PRN/intermittently Type of Home: Apartment Home Access: Stairs to enter Entrance Stairs-Number of Steps: 4 Entrance Stairs-Rails: Can reach both Home Layout: One level     Bathroom Shower/Tub: Teacher, early years/pre: Standard  Home Equipment: [place] - 2 wheels;Cane - single point          Prior Functioning/Environment Level of Independence: Independent        Comments: uses the cane when walking lately        OT Problem List:        OT Treatment/Interventions:      OT Goals(Current goals can be found in the care plan section) Acute Rehab OT Goals Patient Stated Goal: "i hope my sister will be helping me"   OT Frequency:     Barriers to D/C:            Co-evaluation              AM-PAC OT "6 Clicks" Daily Activity     Outcome Measure Help from another person eating meals?: None Help from another person taking care of personal grooming?: None Help from another person toileting, which includes using toliet, bedpan, or urinal?: None Help from another person bathing (including washing, rinsing,  drying)?: None Help from another person to put on and taking off regular upper body clothing?: None Help from another person to put on and taking off regular lower body clothing?: None 6 Click Score: 24   End of Session Nurse Communication: Mobility status;Precautions  Activity Tolerance: Patient tolerated treatment well Patient left: in bed;with call bell/phone within reach  OT Visit Diagnosis: Unsteadiness on feet (R26.81)                Time: [id number] OT Time Calculation (min): 15 min Charges:  OT General Charges $OT Visit: 1 Visit OT Evaluation $OT Eval Low Complexity: 1 Low   Blake [name], OTR/L  Acute Rehabilitation Services Pager: [phone] Office: [phone] .   Jeri Modena 11/26/2019, 9:23 AM

## 2019-11-26 NOTE — Progress Notes (Signed)
CSW notes home health PT order from MD. CSW unable to obtain home health services with patient's insurance (Declined by [facility], [facility], [facility], [facility], [facility]). Per RN and PT, patient is able to go to outpatient therapy instead. CSW will place referral.   Cedric Fishman LCSW

## 2019-11-27 ENCOUNTER — Telehealth: Payer: Self-pay

## 2019-11-27 NOTE — Telephone Encounter (Signed)
Transition Care Management Follow-up Telephone Call  Date of discharge and from where: 11/26/2019, [facility]   How have you been since you were released from the hospital?  he said he is " doing okay."   Any questions or concerns?  none at this time   Items Reviewed:  Did the pt receive and understand the discharge instructions provided?  yes  Medications obtained and verified?  he said he has his medications, and has no questions at this time   Any new allergies since your discharge?  none reported   Do you have support at home?  he stated that his sister comes by to check on him   Other (ie: DME, Home Health, etc) no home health or DME ordered.   Was referred to outpatient PT yesterday but has not yet  been contacted about an appointment.   Functional Questionnaire: (I = Independent and D = Dependent) ADL's: independent. Has RW and cane to use as needed  Follow up appointments reviewed:    PCP Hospital f/u appt confirmed? he said he would call the clinic to schedule follow up with Ms Oletta Lamas, [facility] f/u appt confirmed? .he understands that he needs to call his surgeon to schedule follow up appt   Are transportation arrangements needed?  he would need to arrange  If their condition worsens, is the pt aware to call  their PCP or go to the ED?yes  Was the patient provided with contact information for the PCP's office or ED? he has the clinic number  Was the pt encouraged to call back with questions or concerns?   yes

## 2019-11-29 ENCOUNTER — Encounter (HOSPITAL_COMMUNITY): Payer: Self-pay | Admitting: Emergency Medicine

## 2019-11-29 ENCOUNTER — Telehealth: Payer: Self-pay | Admitting: Specialist

## 2019-11-29 ENCOUNTER — Emergency Department (HOSPITAL_BASED_OUTPATIENT_CLINIC_OR_DEPARTMENT_OTHER): Payer: BLUE CROSS/BLUE SHIELD

## 2019-11-29 ENCOUNTER — Emergency Department (HOSPITAL_COMMUNITY)
Admission: EM | Admit: 2019-11-29 | Discharge: 2019-11-29 | Disposition: A | Discharge status: Home / Self Care | Payer: BLUE CROSS/BLUE SHIELD | Attending: Emergency Medicine | Admitting: Emergency Medicine

## 2019-11-29 DIAGNOSIS — R52 Pain, unspecified: Secondary | ICD-10-CM | POA: Diagnosis not present

## 2019-11-29 DIAGNOSIS — M79604 Pain in right leg: Secondary | ICD-10-CM | POA: Diagnosis not present

## 2019-11-29 DIAGNOSIS — M5441 Lumbago with sciatica, right side: Secondary | ICD-10-CM | POA: Insufficient documentation

## 2019-11-29 DIAGNOSIS — Z79899 Other long term (current) drug therapy: Secondary | ICD-10-CM | POA: Insufficient documentation

## 2019-11-29 DIAGNOSIS — Z96641 Presence of right artificial hip joint: Secondary | ICD-10-CM | POA: Insufficient documentation

## 2019-11-29 DIAGNOSIS — F17210 Nicotine dependence, cigarettes, uncomplicated: Secondary | ICD-10-CM | POA: Diagnosis not present

## 2019-11-29 DIAGNOSIS — M5442 Lumbago with sciatica, left side: Secondary | ICD-10-CM | POA: Diagnosis not present

## 2019-11-29 DIAGNOSIS — G8918 Other acute postprocedural pain: Secondary | ICD-10-CM | POA: Diagnosis not present

## 2019-11-29 DIAGNOSIS — Z7982 Long term (current) use of aspirin: Secondary | ICD-10-CM | POA: Insufficient documentation

## 2019-11-29 LAB — CBC WITH DIFFERENTIAL/PLATELET
Abs Immature Granulocytes: 0.01 10*3/uL (ref 0.00–0.07)
Basophils Absolute: 0 10*3/uL (ref 0.0–0.1)
Basophils Relative: 0 %
Eosinophils Absolute: 0.2 10*3/uL (ref 0.0–0.5)
Eosinophils Relative: 3 %
HCT: 45.9 % (ref 39.0–52.0)
Hemoglobin: 14.8 g/dL (ref 13.0–17.0)
Immature Granulocytes: 0 %
Lymphocytes Relative: 23 %
Lymphs Abs: 1.8 10*3/uL (ref 0.7–4.0)
MCH: 31.4 pg (ref 26.0–34.0)
MCHC: 32.2 g/dL (ref 30.0–36.0)
MCV: 97.2 fL (ref 80.0–100.0)
Monocytes Absolute: 0.7 10*3/uL (ref 0.1–1.0)
Monocytes Relative: 9 %
Neutro Abs: 5.1 10*3/uL (ref 1.7–7.7)
Neutrophils Relative %: 65 %
Platelets: 261 10*3/uL (ref 150–400)
RBC: 4.72 MIL/uL (ref 4.22–5.81)
RDW: 14.6 % (ref 11.5–15.5)
WBC: 7.8 10*3/uL (ref 4.0–10.5)
nRBC: 0 % (ref 0.0–0.2)

## 2019-11-29 LAB — BASIC METABOLIC PANEL
Anion gap: 9 (ref 5–15)
BUN: 7 mg/dL (ref 6–20)
CO2: 25 mmol/L (ref 22–32)
Calcium: 8.9 mg/dL (ref 8.9–10.3)
Chloride: 103 mmol/L (ref 98–111)
Creatinine, Ser: 0.92 mg/dL (ref 0.61–1.24)
GFR calc Af Amer: >60 mL/min (ref >60)
GFR calc non Af Amer: >60 mL/min (ref >60)
Glucose, Bld: 98 mg/dL (ref 70–99)
Potassium: 4.1 mmol/L (ref 3.5–5.1)
Sodium: 137 mmol/L (ref 135–145)

## 2019-11-29 LAB — URINALYSIS, ROUTINE W REFLEX MICROSCOPIC
Bilirubin Urine: NEGATIVE
Glucose, UA: NEGATIVE mg/dL
Hgb urine dipstick: NEGATIVE
Ketones, ur: NEGATIVE mg/dL
Leukocytes,Ua: NEGATIVE
Nitrite: NEGATIVE
Protein, ur: NEGATIVE mg/dL
Specific Gravity, Urine: 1.012 (ref 1.005–1.030)
pH: 6 (ref 5.0–8.0)

## 2019-11-29 MED ORDER — HYDROMORPHONE HCL 2 MG PO TABS: 2.0000 mg | ORAL_TABLET | Freq: Four times a day (QID) | ORAL | 0 refills | Status: DC | PRN

## 2019-11-29 MED ORDER — HYDROMORPHONE HCL 1 MG/ML IJ SOLN
1.0000 mg | Freq: Once | INTRAMUSCULAR | Status: AC
  Administered 2019-11-29: 1 mg via INTRAVENOUS
  Filled 2019-11-29: qty 1

## 2019-11-29 MED ORDER — METHYLPREDNISOLONE 4 MG PO TBPK
ORAL_TABLET | ORAL | 0 refills | Status: DC
Start: 2019-11-29 — End: 2020-04-02

## 2019-11-29 NOTE — ED Triage Notes (Signed)
Pt had surgery on Monday L4-5 spinal decompression- Pt states this morning when he woke up his pain is 10/10 unable to sit. Pain is at surgical site and radiates into bilateral buttocks and burning. Pt states it has been hard to urinate. Pt has bandage off surgical site due to "burning with dressings". No redness or drainage noted. No fever. No sob or leg swelling, pt walking around triage due to pain.

## 2019-11-29 NOTE — Telephone Encounter (Signed)
Spoke with patient he advised that he had surgery 11/25/2019 and is having pain and spasms running up his leg to his buttocks. Patient said the pain is bad. I spoke with Alyse Low and she advised patient go to hospital for DVT eval. I advised patient to go to [facility] through the ER. I advised if Dr [name] needed to be contacted the hospital will contact him by phone. The number to contact if [phone]

## 2019-11-29 NOTE — Progress Notes (Signed)
Bilateral lower extremity venous duplex completed. Refer to "CV Proc" under chart review to view preliminary results.  11/29/2019 3:10 PM Kelby Aline., MHA, RVT, RDCS, RDMS

## 2019-11-29 NOTE — Discharge Instructions (Signed)
Please call Dr. Otho Ket office on Monday morning when they open to schedule a same day appointment to be seen Discontinue the Percocet and start taking the Dilaudid as needed for pain. Start taking the steroids as well.  Pain medication can cause you to be constipated - please start taking Miralax daily (you can take up to 4 cap fills a day until you start having a regular BM). Once you start having regular BM's take once a day as indicated. Increase the water in your diet as well.  Return to the ED for any worsening symptoms including worsening pain, inability to feel your groin area, inability to urinate, peeing or pooping on yourself, fevers > 100.4, inability to walk, numbness in your legs, or any other new/concerning symptoms

## 2019-11-29 NOTE — ED Provider Notes (Signed)
[facility] EMERGENCY DEPARTMENT Provider Note   CSN: [id number] Arrival date & time: 11/29/19  1151     History Chief Complaint  Patient presents with  . Back Pain  . Post-op Problem    Blake [name] is a 49 y.o. male with PSHx bilateral partial lumbar hemilaminectomy L4-L5 by Dr. [name] on 05/17 who presents to the ED today with complaint of gradual onset, constant, worsening, lower back pain radiating down BLEs since surgery. Pt reports his pain has been present since he went home however his medication stopped working/controlling the pain so patient did not take any today. He reports he has been taking 7.5 mg Percocet at home as well as Robaxin without relief. Pt last took percocet last night. He reports he has not had a BM since surgery. He also mentions that he will need to really focus to urinate however is able to urinate. Pt reports his legs will give out on him if he is on them for a prolonged period of time however he is able to ambulate. Denies any fevers, chills, urinary retention, urinary or bowel incontinence, saddle anesthesia, or any other associated symptoms.   Per chart review; pt called [facility] office this morning and was advised to come to the ED to rule out DVT given leg pain.   The history is provided by the patient and medical records.       Past Medical History:  Diagnosis Date  . Arthritis   . Back pain   . Dilated aortic root (HCC)    66mm ascending aorta by echo 09/2017, see 08-2018 echo in epic for newer results   . ERECTILE DYSFUNCTION, MILD 11/12/2007   Qualifier: Diagnosis of  By: Carolyne Littles    . Head injury with loss of consciousness ([place]) 1997   unconsciousness  . Heart murmur    as a child  . History of blood transfusion    1997, received 15 units of blood   . Morbid obesity ([place])   . Neck pain   . PVC's (premature ventricular contractions)   . TOBACCO ABUSE 11/12/2007   Qualifier: Diagnosis of  By: Carmie End MD, Junie Panning    .  Tremor    head and hands , controlled on cymbalta    Patient Active Problem List   Diagnosis Date Noted  . Spinal stenosis, lumbar region with neurogenic claudication 11/25/2019    Class: Chronic  . Spinal stenosis of lumbar region with neurogenic claudication 11/25/2019  . Cervical dystonia 07/09/2019  . Neck pain 07/09/2019  . OSA (obstructive sleep apnea) 10/31/2018  . Status post total replacement of right hip 09/07/2018  . Unilateral primary osteoarthritis, right hip 08/13/2018  . Severe obesity (BMI >= 40) ([place]) 08/13/2018  . Chronic bilateral low back pain with bilateral sciatica 04/02/2018  . Epidural lipomatosis 04/02/2018  . Right hip pain 04/02/2018  . Preop cardiovascular exam 03/30/2018  . Dilated aortic root ([place])   . Chest pain 07/27/2017  . Palpitations 07/26/2017  . ERECTILE DYSFUNCTION, MILD 11/12/2007  . Tobacco abuse 11/12/2007    Past Surgical History:  Procedure Laterality Date  . FACIAL FRACTURE SURGERY  09/1995   left side of face crush injury, carnial fracture   . LUMBAR LAMINECTOMY/DECOMPRESSION MICRODISCECTOMY N/A 11/25/2019   Procedure: BILATERAL PARTIAL LUMBAR HEMILAMINECTOMIES LUMBAR FOUR-FIVE;  Surgeon: Jessy Oto, MD;  Location: [facility];  Service: Orthopedics;  Laterality: N/A;  . TOTAL HIP ARTHROPLASTY Right 09/07/2018   Procedure: RIGHT TOTAL HIP ARTHROPLASTY ANTERIOR  APPROACH;  Surgeon: Mcarthur Rossetti, MD;  Location: [facility];  Service: Orthopedics;  Laterality: Right;       Family History  Problem Relation Age of Onset  . COPD Mother   . Leukemia Father   . Hypertension Sister   . Diabetes Sister     Social History   Tobacco Use  . Smoking status: Current Every Day Smoker    Packs/day: 0.50    Years: 33.00    Pack years: 16.50  . Smokeless tobacco: Never Used  Substance Use Topics  . Alcohol use: Yes    Alcohol/week: 1.0 standard drinks    Types: 1 Cans of beer per week  . Drug use: No    Home Medications Prior  to Admission medications   Medication Sig Start Date End Date Taking? Authorizing Provider  aspirin EC 81 MG tablet Take 1 tablet (81 mg total) by mouth daily. 08/23/19  Yes Turner, Eber Hong, MD  atorvastatin (LIPITOR) 20 MG tablet Take 1 tablet (20 mg total) by mouth daily. 09/16/19  Yes Turner, Eber Hong, MD  gabapentin (NEURONTIN) 600 MG tablet Take 2 tablets (1,200 mg total) by mouth 3 (three) times daily. 04/01/19  Yes [name], NP  methocarbamol (ROBAXIN) 500 MG tablet Take 1 tablet (500 mg total) by mouth every 6 (six) hours as needed for muscle spasms. 11/26/19  Yes Lanae Crumbly, PA-C  oxyCODONE-acetaminophen (PERCOCET) 7.5-325 MG tablet Take 1 tablet by mouth every 4 (four) hours as needed for severe pain. 11/26/19  Yes Lanae Crumbly, PA-C  propranolol ER (INDERAL LA) 60 MG 24 hr capsule Take 1 capsule (60 mg total) by mouth daily. 04/01/19  Yes [name], NP  sildenafil (VIAGRA) 100 MG tablet Take 0.5-1 tablets (50-100 mg total) by mouth daily as needed for erectile dysfunction. 04/01/19  Yes [name], NP  HYDROmorphone (DILAUDID) 2 MG tablet Take 1 tablet (2 mg total) by mouth every 6 (six) hours as needed for severe pain. 11/29/19   Eustaquio Maize, PA-C  methylPREDNISolone (MEDROL DOSEPAK) 4 MG TBPK tablet Follow package insert 11/29/19   Eustaquio Maize, PA-C    Allergies    Patient has no known allergies.  Review of Systems   Review of Systems  Constitutional: Negative for chills and fever.  Respiratory: Negative for shortness of breath.   Cardiovascular: Negative for chest pain.  Gastrointestinal: Negative for nausea and vomiting.  Genitourinary: Negative for difficulty urinating.  Musculoskeletal: Positive for arthralgias and back pain.  All other systems reviewed and are negative.   Physical Exam Updated Vital Signs BP 114/76   Pulse (!) 105   Temp 98.1 F (36.7 C) (Oral)   Resp (!) 22   SpO2 98%   Physical Exam Vitals and nursing note  reviewed.  Constitutional:      Appearance: He is obese. He is not ill-appearing.  HENT:     Head: Normocephalic and atraumatic.  Eyes:     Conjunctiva/sclera: Conjunctivae normal.  Cardiovascular:     Rate and Rhythm: Normal rate and regular rhythm.     Pulses: Normal pulses.  Pulmonary:     Effort: Pulmonary effort is normal.     Breath sounds: Normal breath sounds. No wheezing, rhonchi or rales.  Abdominal:     Palpations: Abdomen is soft.     Tenderness: There is no abdominal tenderness. There is no guarding or rebound.  Musculoskeletal:     Cervical back: Neck supple.     Comments:  Midline incision with staples in plan. No erythema, edema, or drainage noted. + Bilateral lumbar paraspinal musculature TTP. Pt easily able to lift legs off of stretcher. Strength 5/5 with resistance. Sensation intact to dull and sharp diffusely to BLEs. 2+ DTRs. 2+ DP pulses bilaterally.   Skin:    General: Skin is warm and dry.  Neurological:     Mental Status: He is alert.     ED Results / Procedures / Treatments   Labs (all labs ordered are listed, but only abnormal results are displayed) Labs Reviewed  BASIC METABOLIC PANEL  CBC WITH DIFFERENTIAL/PLATELET  URINALYSIS, ROUTINE W REFLEX MICROSCOPIC    EKG None  Radiology VAS [place] LOWER EXTREMITY VENOUS (DVT)  Result Date: 11/29/2019  Lower Venous DVTStudy Indications: Pain, and Recent spinal decompression.  Comparison Study: No prior study Performing Technologist: Maudry Mayhew MHA, RDMS, RVT, RDCS  Examination Guidelines: A complete evaluation includes B-mode imaging, spectral Doppler, color Doppler, and power Doppler as needed of all accessible portions of each vessel. Bilateral testing is considered an integral part of a complete examination. Limited examinations for reoccurring indications may be performed as noted. The reflux portion of the exam is performed with the patient in reverse Trendelenburg.   +---------+---------------+---------+-----------+----------+--------------+ RIGHT    CompressibilityPhasicitySpontaneityPropertiesThrombus Aging +---------+---------------+---------+-----------+----------+--------------+ CFV      Full           Yes      Yes                                 +---------+---------------+---------+-----------+----------+--------------+ SFJ      Full                                                        +---------+---------------+---------+-----------+----------+--------------+ FV Prox  Full                                                        +---------+---------------+---------+-----------+----------+--------------+ FV Mid   Full                                                        +---------+---------------+---------+-----------+----------+--------------+ FV DistalFull                                                        +---------+---------------+---------+-----------+----------+--------------+ PFV      Full                                                        +---------+---------------+---------+-----------+----------+--------------+ POP      Full           Yes  Yes                                 +---------+---------------+---------+-----------+----------+--------------+ PTV      Full                                                        +---------+---------------+---------+-----------+----------+--------------+ PERO     Full                                                        +---------+---------------+---------+-----------+----------+--------------+   +---------+---------------+---------+-----------+----------+--------------+ LEFT     CompressibilityPhasicitySpontaneityPropertiesThrombus Aging +---------+---------------+---------+-----------+----------+--------------+ CFV      Full           Yes      Yes                                  +---------+---------------+---------+-----------+----------+--------------+ SFJ      Full                                                        +---------+---------------+---------+-----------+----------+--------------+ FV Prox  Full                                                        +---------+---------------+---------+-----------+----------+--------------+ FV Mid   Full                                                        +---------+---------------+---------+-----------+----------+--------------+ FV DistalFull                                                        +---------+---------------+---------+-----------+----------+--------------+ PFV      Full                                                        +---------+---------------+---------+-----------+----------+--------------+ POP      Full           Yes      Yes                                 +---------+---------------+---------+-----------+----------+--------------+ PTV      Full                                                        +---------+---------------+---------+-----------+----------+--------------+  PERO     Full                                                        +---------+---------------+---------+-----------+----------+--------------+     Summary: RIGHT: - There is no evidence of deep vein thrombosis in the lower extremity.  - No cystic structure found in the popliteal fossa.  LEFT: - There is no evidence of deep vein thrombosis in the lower extremity.  - No cystic structure found in the popliteal fossa.  *See table(s) above for measurements and observations.    Preliminary     Procedures Procedures (including critical care time)  Medications Ordered in ED Medications  HYDROmorphone (DILAUDID) injection 1 mg (1 mg Intravenous Given 11/29/19 1646)    ED Course  I have reviewed the triage vital signs and the nursing notes.  Pertinent labs & imaging results that were  available during my care of the patient were reviewed by me and considered in my medical decision making (see chart for details).  Clinical Course as of Nov 28 1840  Fri Nov 29, 2019  1407 Discussed case with Dr. [name] who recommends dilaudid in the ED and discharge home with same if improvement; recommends medrol dose pack as well and to follow up in the office on Monday.    [MV]    Clinical Course User Index [MV] Eustaquio Maize, [place]   MDM Rules/Calculators/A&P                      49 year old male who recently underwent partial bilateral hemilaminectomy of L4-L5 on 5/15 by Dr. Nicholes Mango who presents to the ED with worsening pain uncontrolled with 7.5 mg Percocet as well as Robaxin at home.  Call the office today and was told to come to the ED for further evaluation.  On arrival to the ED patient is afebrile.  He was noted to be mildly tachycardic at 105 however suspect this is secondary to pain.  Has any fevers or chills at home.  No red flag symptoms concerning for cauda equina, spinal epidural abscess, AAA.  Will call Dr. Otho Ket office to speak with him regarding patient.   Dr. [name] recommends dilaudid for pain with discharge of same. Also recommends medrol dose pack. Will work up for DVT here given complaint of pain in legs however appears to be more referred pain. Will obtain screening labs as well.   DVT study negative.  Labwork reassuring at this time. No elevation in WBC. Electrolytes unremarkable and no infection in urine. On reevaluation pt reports improvement in pain with dilaudid. He reports pain is worse if he tries to sit up which is when he experiences his "spasming" the most. Will discharge with short course of dilaudid to get him through the weekend until he can be seen by Dr. Otho Ket team. Will also discharge home with medrol dose pack. Strict return precautions discussed. Pt is in agreement with plan and stable for discharge home.   This note was prepared using Dragon voice  recognition software and may include unintentional dictation errors due to the inherent limitations of voice recognition software.  Final Clinical Impression(s) / ED Diagnoses Final diagnoses:  Post-operative pain  Acute bilateral low back pain with bilateral sciatica    Rx / DC Orders ED Discharge Orders  Ordered    HYDROmorphone (DILAUDID) 2 MG tablet  Every 6 hours PRN     11/29/19 1839    methylPREDNISolone (MEDROL DOSEPAK) 4 MG TBPK tablet     11/29/19 1839           Discharge Instructions     Please call Dr. Otho Ket office on Monday morning when they open to schedule a same day appointment to be seen [place] and start taking the Dilaudid as needed for pain. Start taking the steroids as well.  Pain medication can cause you to be constipated - please start taking Miralax daily (you can take up to 4 cap fills a day until you start having a regular BM). Once you start having regular BM's take once a day as indicated. Increase the water in your diet as well.  Return to the ED for any worsening symptoms including worsening pain, inability to feel your groin area, inability to urinate, peeing or pooping on yourself, fevers > 100.4, inability to walk, numbness in your legs, or any other new/concerning symptoms        Eustaquio Maize, PA-C 11/29/19 1842    Sherwood Gambler, MD 11/30/19 [phone]

## 2019-11-29 NOTE — Telephone Encounter (Signed)
I called patient to follow up he is at the ER and will call the office back if they need further instructions.  I advised that the ER can call Dr. [name]

## 2019-12-05 ENCOUNTER — Encounter: Payer: Self-pay | Admitting: Specialist

## 2019-12-05 ENCOUNTER — Other Ambulatory Visit: Payer: Self-pay

## 2019-12-05 ENCOUNTER — Other Ambulatory Visit: Payer: Self-pay | Admitting: Specialist

## 2019-12-05 ENCOUNTER — Telehealth: Payer: Self-pay | Admitting: Specialist

## 2019-12-05 ENCOUNTER — Inpatient Hospital Stay: Payer: BLUE CROSS/BLUE SHIELD | Admitting: Specialist

## 2019-12-05 ENCOUNTER — Ambulatory Visit (INDEPENDENT_AMBULATORY_CARE_PROVIDER_SITE_OTHER): Payer: BLUE CROSS/BLUE SHIELD | Admitting: Specialist

## 2019-12-05 ENCOUNTER — Inpatient Hospital Stay: Payer: BLUE CROSS/BLUE SHIELD | Admitting: Surgery

## 2019-12-05 VITALS — BP 122/83 | HR 104 | Ht 75.0 in | Wt 360.0 lb

## 2019-12-05 DIAGNOSIS — M48062 Spinal stenosis, lumbar region with neurogenic claudication: Secondary | ICD-10-CM

## 2019-12-05 DIAGNOSIS — M546 Pain in thoracic spine: Secondary | ICD-10-CM

## 2019-12-05 DIAGNOSIS — D1779 Benign lipomatous neoplasm of other sites: Secondary | ICD-10-CM

## 2019-12-05 MED ORDER — HYDROMORPHONE HCL 2 MG PO TABS: 2.0000 mg | ORAL_TABLET | Freq: Four times a day (QID) | ORAL | 0 refills | Status: DC | PRN

## 2019-12-05 NOTE — Patient Instructions (Addendum)
Plan: Avoid frequent bending and stooping  No lifting greater than 10 lbs. May use ice or moist heat for pain. Weight loss is of benefit. Best medication for lumbar disc disease is arthritis medications like motrin, celebrex and naprosyn. Exercise is important to improve your indurance and does allow people to function better inspite of back pain. Dilaudid for one more week then percocet or hydrocodone.  Pain is an expected part of surgery of the spine, it is important to come off the narcotics as soon as  Possible to prevent physical dependence. I strongly encourage you to lose weight as your BMI shows obesity and this is the cause of the fat in your spinal canal that  Results in spinal narrowing.

## 2019-12-05 NOTE — Telephone Encounter (Signed)
Sent request to Dr. Nitka 

## 2019-12-05 NOTE — Telephone Encounter (Signed)
Patient called to remind [name] that he needs his medication called in  [facility] our system went down during his appointment.

## 2019-12-05 NOTE — Telephone Encounter (Signed)
Patient called in to see if rx has been sent to [facility].   Best Contact # [phone]

## 2019-12-05 NOTE — Progress Notes (Signed)
Post-Op Visit Note   Patient: Blake [name]           Date of Birth: Oct 23, 1970           MRN: [id number] Visit Date: 12/05/2019 PCP: [name], NP   Assessment & Plan:2 weeks post op for lumbar surgery bilateral partial hemilaminectomies L4-5 for subarticular stenosis.  Chief Complaint:  Chief Complaint  Patient presents with  . Lower Back - Routine Post Op  Incision is healed  Legs are NV normal.  Visit Diagnoses:  1. Spinal stenosis of lumbar region with neurogenic claudication   2. Pain in thoracic spine   3. Epidural lipomatosis     Plan: Avoid frequent bending and stooping  No lifting greater than 10 lbs. May use ice or moist heat for pain. Weight loss is of benefit. Best medication for lumbar disc disease is arthritis medications like motrin, celebrex and naprosyn. Exercise is important to improve your indurance and does allow people to function better inspite of back pain. Dilaudid for one more week then percocet or hydrocodone.  Pain is an expected part of surgery of the spine, it is important to come off the narcotics as soon as  Possible to prevent physical dependence.  Return in about 4 weeks (around 01/02/2020).   Orders:  No orders of the defined types were placed in this encounter.  Meds ordered this encounter  Medications  . HYDROmorphone (DILAUDID) 2 MG tablet    Sig: Take 1 tablet (2 mg total) by mouth every 6 (six) hours as needed for severe pain.    Dispense:  20 tablet    Refill:  0    Imaging: No results found.  PMFS History: Patient Active Problem List   Diagnosis Date Noted  . Spinal stenosis, lumbar region with neurogenic claudication 11/25/2019    Priority: High    Class: Chronic  . Spinal stenosis of lumbar region with neurogenic claudication 11/25/2019  . Cervical dystonia 07/09/2019  . Neck pain 07/09/2019  . OSA (obstructive sleep apnea) 10/31/2018  . Status post total replacement of right hip 09/07/2018  . Unilateral  primary osteoarthritis, right hip 08/13/2018  . Severe obesity (BMI >= 40) ([place]) 08/13/2018  . Chronic bilateral low back pain with bilateral sciatica 04/02/2018  . Epidural lipomatosis 04/02/2018  . Right hip pain 04/02/2018  . Preop cardiovascular exam 03/30/2018  . Dilated aortic root ([place])   . Chest pain 07/27/2017  . Palpitations 07/26/2017  . ERECTILE DYSFUNCTION, MILD 11/12/2007  . Tobacco abuse 11/12/2007   Past Medical History:  Diagnosis Date  . Arthritis   . Back pain   . Dilated aortic root (HCC)    19mm ascending aorta by echo 09/2017, see 08-2018 echo in epic for newer results   . ERECTILE DYSFUNCTION, MILD 11/12/2007   Qualifier: Diagnosis of  By: Carolyne Littles    . Head injury with loss of consciousness ([place]) 1997   unconsciousness  . Heart murmur    as a child  . History of blood transfusion    1997, received 15 units of blood   . Morbid obesity ([place])   . Neck pain   . PVC's (premature ventricular contractions)   . TOBACCO ABUSE 11/12/2007   Qualifier: Diagnosis of  By: Carmie End MD, Junie Panning    . Tremor    head and hands , controlled on cymbalta    Family History  Problem Relation Age of Onset  . COPD Mother   . Leukemia Father   .  Hypertension Sister   . Diabetes Sister     Past Surgical History:  Procedure Laterality Date  . FACIAL FRACTURE SURGERY  09/1995   left side of face crush injury, carnial fracture   . LUMBAR LAMINECTOMY/DECOMPRESSION MICRODISCECTOMY N/A 11/25/2019   Procedure: BILATERAL PARTIAL LUMBAR HEMILAMINECTOMIES LUMBAR FOUR-FIVE;  Surgeon: Jessy Oto, MD;  Location: [facility];  Service: Orthopedics;  Laterality: N/A;  . TOTAL HIP ARTHROPLASTY Right 09/07/2018   Procedure: RIGHT TOTAL HIP ARTHROPLASTY ANTERIOR APPROACH;  Surgeon: Mcarthur Rossetti, MD;  Location: [facility];  Service: Orthopedics;  Laterality: Right;   Social History   Occupational History  . Occupation: waiting on disability  Tobacco Use  . Smoking status: Current Every Day  Smoker    Packs/day: 0.50    Years: 33.00    Pack years: 16.50  . Smokeless tobacco: Never Used  Substance and Sexual Activity  . Alcohol use: Yes    Alcohol/week: 1.0 standard drinks    Types: 1 Cans of beer per week  . Drug use: No  . Sexual activity: Not Currently

## 2019-12-05 NOTE — Telephone Encounter (Signed)
This request has been sent to Dr. [name] already

## 2019-12-10 ENCOUNTER — Other Ambulatory Visit: Payer: Self-pay | Admitting: Specialist

## 2019-12-10 NOTE — Telephone Encounter (Signed)
Pt called stating his medicine still hasn't been called in from when our system went down and wanted to check on this.   [phone]

## 2019-12-11 MED ORDER — METHOCARBAMOL 500 MG PO TABS: 500.0000 mg | ORAL_TABLET | Freq: Four times a day (QID) | ORAL | 0 refills | Status: DC | PRN

## 2019-12-11 NOTE — Telephone Encounter (Signed)
I called and he states that he needs the muscle relaxer called in. I sent the request to Dr. [name]

## 2020-01-22 ENCOUNTER — Inpatient Hospital Stay: Payer: BLUE CROSS/BLUE SHIELD | Admitting: Surgery

## 2020-01-30 ENCOUNTER — Inpatient Hospital Stay: Payer: BLUE CROSS/BLUE SHIELD | Admitting: Surgery

## 2020-02-06 ENCOUNTER — Encounter: Payer: Self-pay | Admitting: Surgery

## 2020-02-06 ENCOUNTER — Ambulatory Visit (INDEPENDENT_AMBULATORY_CARE_PROVIDER_SITE_OTHER): Payer: BLUE CROSS/BLUE SHIELD | Admitting: Surgery

## 2020-02-06 VITALS — BP 139/89 | HR 85 | Ht 75.0 in | Wt 360.0 lb

## 2020-02-06 DIAGNOSIS — M5416 Radiculopathy, lumbar region: Secondary | ICD-10-CM

## 2020-02-06 DIAGNOSIS — Z9889 Other specified postprocedural states: Secondary | ICD-10-CM

## 2020-02-06 NOTE — Progress Notes (Signed)
Office Visit Note   Patient: Blake [name]           Date of Birth: 03/02/1971           MRN: [id number] Visit Date: 02/06/2020              Requested by: [name], NP [address] [place],  [place] [address] PCP: [name], NP   Assessment & Plan: Visit Diagnoses:  1. Status post lumbar spine operative procedure for decompression of spinal cord   Low back pain  Plan:  With patient's ongoing pain up to this point I will get lumbar MRI to rule out HNP/gnosis.  Patient will follow up with Dr. [name] after completion to discuss results and further treatment options.  He knows to still avoid bending twisting lifting.  Follow-Up Instructions: Return in about 3 weeks (around 02/27/2020) for With Dr. [name].   Orders:  No orders of the defined types were placed in this encounter.  No orders of the defined types were placed in this encounter.     Procedures: No procedures performed   Clinical Data: No additional findings.   Subjective: Chief Complaint  Patient presents with  . Lower Back - Routine Post Op    HPI 49 year old black male who is status post L4-5 decompression Nov 25, 2019 returns.  Patient missed his last 2 appointments with me due to public transportation issues.  He also has not started formal PT due to his insurance not approving this.  Patient states that he does not have any help at home so has been doing a lot of things for himself.  Continues have ongoing low back soreness.  Over the last 2 weeks has had some intermittent numbness and tingling in both legs.  Low back pain radiates into the hips.  Few days postop patient was seen in the emergency room for increased low back pain.  Was given prednisone taper.   Objective: Vital Signs: BP (!) 139/89   Pulse 85   Ht 6\' 3"  (1.905 m)   Wt (!) 360 lb (163.3 kg)   BMI 45.00 kg/m   Physical Exam Gait is antalgic.  Bilateral lumbar paraspinal tenderness.  Negative log roll bilateral  hips.  Some pain with bilateral straight leg raise.  Bilateral calves nontender. Ortho Exam  Specialty Comments:  No specialty comments available.  Imaging: No results found.   PMFS History: Patient Active Problem List   Diagnosis Date Noted  . Spinal stenosis, lumbar region with neurogenic claudication 11/25/2019    Class: Chronic  . Spinal stenosis of lumbar region with neurogenic claudication 11/25/2019  . Cervical dystonia 07/09/2019  . Neck pain 07/09/2019  . OSA (obstructive sleep apnea) 10/31/2018  . Status post total replacement of right hip 09/07/2018  . Unilateral primary osteoarthritis, right hip 08/13/2018  . Severe obesity (BMI >= 40) ([facility]) 08/13/2018  . Chronic bilateral low back pain with bilateral sciatica 04/02/2018  . Epidural lipomatosis 04/02/2018  . Right hip pain 04/02/2018  . Preop cardiovascular exam 03/30/2018  . Dilated aortic root ([facility])   . Chest pain 07/27/2017  . Palpitations 07/26/2017  . ERECTILE DYSFUNCTION, MILD 11/12/2007  . Tobacco abuse 11/12/2007   Past Medical History:  Diagnosis Date  . Arthritis   . Back pain   . Dilated aortic root ([facility])    33mm ascending aorta by echo 09/2017, see 08-2018 echo in epic for newer results   . ERECTILE DYSFUNCTION, MILD 11/12/2007   Qualifier:  Diagnosis of  By: Carolyne Littles    . Head injury with loss of consciousness ([facility]) 1997   unconsciousness  . Heart murmur    as a child  . History of blood transfusion    1997, received 15 units of blood   . Morbid obesity ([facility])   . Neck pain   . PVC's (premature ventricular contractions)   . TOBACCO ABUSE 11/12/2007   Qualifier: Diagnosis of  By: Carmie End MD, Junie Panning    . Tremor    head and hands , controlled on cymbalta    Family History  Problem Relation Age of Onset  . COPD Mother   . Leukemia Father   . Hypertension Sister   . Diabetes Sister     Past Surgical History:  Procedure Laterality Date  . FACIAL FRACTURE SURGERY  09/1995   left side of face  crush injury, carnial fracture   . LUMBAR LAMINECTOMY/DECOMPRESSION MICRODISCECTOMY N/A 11/25/2019   Procedure: BILATERAL PARTIAL LUMBAR HEMILAMINECTOMIES LUMBAR FOUR-FIVE;  Surgeon: Jessy Oto, MD;  Location: [facility];  Service: Orthopedics;  Laterality: N/A;  . TOTAL HIP ARTHROPLASTY Right 09/07/2018   Procedure: RIGHT TOTAL HIP ARTHROPLASTY ANTERIOR APPROACH;  Surgeon: Mcarthur Rossetti, MD;  Location: [facility];  Service: Orthopedics;  Laterality: Right;   Social History   Occupational History  . Occupation: waiting on disability  Tobacco Use  . Smoking status: Current Every Day Smoker    Packs/day: 0.50    Years: 33.00    Pack years: 16.50  . Smokeless tobacco: Never Used  Substance and Sexual Activity  . Alcohol use: Yes    Alcohol/week: 1.0 standard drink    Types: 1 Cans of beer per week  . Drug use: No  . Sexual activity: Not Currently

## 2020-02-13 ENCOUNTER — Telehealth: Payer: Self-pay | Admitting: *Deleted

## 2020-02-13 NOTE — Telephone Encounter (Signed)
Received vm from pt stating he is returning my call in reference to getting scheduled for MRI, I tried calling pt, vm comes on stating is vm.

## 2020-02-26 ENCOUNTER — Telehealth: Payer: Self-pay

## 2020-02-26 NOTE — Telephone Encounter (Signed)
Patient called in wanting to notify james that numbness is getting worse , pain is getting bad , still haven't gotten any information back to get his MRI . And still have not heard anything about getting PT set up. Also still having back spasms . Tried to message on Mychart but he said he is getting an error.

## 2020-02-26 NOTE — Telephone Encounter (Signed)
I called and advised patient that they have been trying to reach him to schedule these things however it is going to the VM and it is full.  He states that he will clean out the VM and I gave him the number to call for PT and he states that he will call them, and as far as the MRI he would need to speak to [facility] for as she is trying to get it sched however she is not here to day and advised that I would forward a message to her to get him scheduled.

## 2020-02-27 NOTE — Telephone Encounter (Signed)
Spoke with someone from [employer]. They have left 2 message 7/29 and 7/30. Josem Kaufmann is approved.  They will attempt to contact patient again.  Patient can also call [phone] and speak with someone from imaging.

## 2020-02-27 NOTE — Telephone Encounter (Signed)
Called and spoke with patient. He is locked out of his voicemail currently and working to get that fixed. I gave patient the number to call for scheduling his MRI.

## 2020-03-01 ENCOUNTER — Other Ambulatory Visit: Payer: BLUE CROSS/BLUE SHIELD

## 2020-03-02 ENCOUNTER — Other Ambulatory Visit: Payer: Self-pay

## 2020-03-02 ENCOUNTER — Ambulatory Visit (INDEPENDENT_AMBULATORY_CARE_PROVIDER_SITE_OTHER): Payer: BLUE CROSS/BLUE SHIELD

## 2020-03-02 DIAGNOSIS — Z9889 Other specified postprocedural states: Secondary | ICD-10-CM | POA: Diagnosis not present

## 2020-03-02 DIAGNOSIS — M5416 Radiculopathy, lumbar region: Secondary | ICD-10-CM | POA: Diagnosis not present

## 2020-03-02 MED ORDER — GADOBUTROL 1 MMOL/ML IV SOLN
10.0000 mL | Freq: Once | INTRAVENOUS | Status: AC | PRN
  Administered 2020-03-02: 10 mL via INTRAVENOUS

## 2020-03-05 ENCOUNTER — Ambulatory Visit: Payer: BLUE CROSS/BLUE SHIELD | Admitting: Specialist

## 2020-03-10 ENCOUNTER — Telehealth: Payer: Self-pay | Admitting: Specialist

## 2020-03-10 NOTE — Telephone Encounter (Signed)
Called patient per [employer]. Patient is ok being called with MRI results. Patient said he is still in a lot of pain. The number to contact patient [phone]

## 2020-03-27 ENCOUNTER — Telehealth: Payer: Self-pay | Admitting: Surgery

## 2020-03-27 NOTE — Telephone Encounter (Signed)
Please have Dr. [name] advise on MRI results for patient.  Patient no showed his appointment with Dr. [name].  Thank You.

## 2020-03-27 NOTE — Telephone Encounter (Signed)
Patient called.   He said he has yet to hear back about his test results and that he is still experiencing a severe amount of pain in his back  Call back: [phone]

## 2020-03-27 NOTE — Telephone Encounter (Signed)
I called and advised that Dr. [name] does not call MRI results to patients as he wants to show the pictures in person so the patients get a clearer picture of what is going on with them.

## 2020-03-27 NOTE — Telephone Encounter (Signed)
An appt was made for 03/30/20 @ 830

## 2020-03-30 ENCOUNTER — Ambulatory Visit: Payer: BLUE CROSS/BLUE SHIELD | Admitting: Specialist

## 2020-04-02 ENCOUNTER — Telehealth: Payer: Self-pay | Admitting: *Deleted

## 2020-04-02 ENCOUNTER — Encounter: Payer: Self-pay | Admitting: Cardiology

## 2020-04-02 ENCOUNTER — Other Ambulatory Visit: Payer: Self-pay

## 2020-04-02 ENCOUNTER — Telehealth: Payer: Self-pay | Admitting: Radiology

## 2020-04-02 ENCOUNTER — Telehealth (INDEPENDENT_AMBULATORY_CARE_PROVIDER_SITE_OTHER): Payer: BLUE CROSS/BLUE SHIELD | Admitting: Cardiology

## 2020-04-02 VITALS — Ht 75.0 in | Wt 350.0 lb

## 2020-04-02 DIAGNOSIS — I493 Ventricular premature depolarization: Secondary | ICD-10-CM

## 2020-04-02 DIAGNOSIS — I77810 Thoracic aortic ectasia: Secondary | ICD-10-CM

## 2020-04-02 DIAGNOSIS — R079 Chest pain, unspecified: Secondary | ICD-10-CM

## 2020-04-02 DIAGNOSIS — G4733 Obstructive sleep apnea (adult) (pediatric): Secondary | ICD-10-CM

## 2020-04-02 NOTE — Patient Instructions (Signed)
Medication Instructions:  Your physician recommends that you continue on your current medications as directed. Please refer to the Current Medication list given to you today.  *If you need a refill on your cardiac medications before your next appointment, please call your pharmacy*   Lab Work: Your physician recommends that you return for lab work on day of Lexiscan myoview--Lipid and ALT.  This will be fasting  If you have labs (blood work) drawn today and your tests are completely normal, you will receive your results only by:  [place] (if you have MyChart) OR  A paper copy in the mail If you have any lab test that is abnormal or we need to change your treatment, we will call you to review the results.   Testing/Procedures: Your physician has requested that you have a lexiscan myoview. For further information please visit [email or web address].    You are scheduled for a Myocardial Perfusion Imaging Study on: Someone will call you to schedule this test. Please arrive 15 minutes prior to your appointment time for registration and insurance purposes.   The test will take approximately 3 to 4 hours to complete; you may bring reading material.   **If you are pregnant or breastfeeding, please notify the nuclear lab prior to your appointment**   How to prepare for your Myocardial Perfusion Test:  Do not eat or drink 3 hours prior to your test, except you may have water.  Do not consume products containing caffeine (regular or decaffeinated) 12 hours prior to your test. (ex: coffee, chocolate, sodas, tea).  Do bring a list of your current medications with you. If not listed below, you may take your medications as normal.  You may take all your normal medications prior to test.  Do wear comfortable clothes (no dresses or overalls) and walking shoes, tennis shoes preferred (No heels or open toe shoes are allowed).  Do NOT wear cologne, perfume, aftershave, or lotions (deodorant is  allowed).  If these instructions are not followed, your test will have to be rescheduled.   Please report to [address], [address] for your test. If you have questions or concerns about your appointment, you can call the Nuclear Lab at [phone].   If you cannot keep your appointment, please provide 24 hours notification to the Nuclear Lab, to avoid a possible $50 charge to your account.  Your physician has recommended that you wear a holter monitor. Holter monitors are medical devices that record the hearts electrical activity. Doctors most often use these monitors to diagnose arrhythmias. Arrhythmias are problems with the speed or rhythm of the heartbeat. The monitor is a small, portable device. You can wear one while you do your normal daily activities. This is usually used to diagnose what is causing palpitations/syncope (passing out).   ZIO XT- Long Term Monitor Instructions   Your physician has requested you wear your ZIO patch monitor___14____days.   This is a single patch monitor.  [employer] supplies one patch monitor per enrollment.  Additional stickers are not available.   Please do not apply patch if you will be having a Nuclear Stress Test, Echocardiogram, Cardiac CT, MRI, or Chest Xray during the time frame you would be wearing the monitor. The patch cannot be worn during these tests.  You cannot remove and re-apply the ZIO XT patch monitor.   Your ZIO patch monitor will be sent USPS Priority mail from [facility] directly to your home address. The monitor may also be mailed to  a PO BOX if home delivery is not available.   It may take 3-5 days to receive your monitor after you have been enrolled.   Once you have received you monitor, please review enclosed instructions.  Your monitor has already been registered assigning a specific monitor serial # to you.   Applying the monitor   Shave hair from upper left chest.   Hold abrader disc by orange tab.  Rub  abrader in 40 strokes over left upper chest as indicated in your monitor instructions.   Clean area with 4 enclosed alcohol pads .  Use all pads to assure are is cleaned thoroughly.  Let dry.   Apply patch as indicated in monitor instructions.  Patch will be place under collarbone on left side of chest with arrow pointing upward.   Rub patch adhesive wings for 2 minutes.Remove white label marked "1".  Remove white label marked "2".  Rub patch adhesive wings for 2 additional minutes.   While looking in a mirror, press and release button in center of patch.  A small green light will flash 3-4 times .  This will be your only indicator the monitor has been turned on.     Do not shower for the first 24 hours.  You may shower after the first 24 hours.   Press button if you feel a symptom. You will hear a small click.  Record Date, Time and Symptom in the Patient Log Book.   When you are ready to remove patch, follow instructions on last 2 pages of Patient Log Book.  Stick patch monitor onto last page of Patient Log Book.   Place Patient Log Book in [place] box.  Use locking tab on box and tape box closed securely.  The [employer] has [employer] on it.  Please place in mailbox as soon as possible.  Your physician should have your test results approximately 7 days after the monitor has been mailed back to [facility].   Call [facility] at [phone] if you have questions regarding your ZIO XT patch monitor.  Call them immediately if you see an orange light blinking on your monitor.   If your monitor falls off in less than 4 days contact our Monitor department at [phone].  If your monitor becomes loose or falls off after 4 days call [employer] at [phone] for suggestions on securing your monitor.     Follow-Up: At [facility], you and your health needs are our priority.  As part of our continuing mission to provide you with exceptional heart care, we  have created designated Provider Care Teams.  These Care Teams include your primary Cardiologist (physician) and Advanced Practice Providers (APPs -  Physician Assistants and Nurse Practitioners) who all work together to provide you with the care you need, when you need it.  We recommend signing up for the patient portal called "MyChart".  Sign up information is provided on this After Visit Summary.  MyChart is used to connect with patients for Virtual Visits (Telemedicine).  Patients are able to view lab/test results, encounter notes, upcoming appointments, etc.  Non-urgent messages can be sent to your provider as well.   To learn more about what you can do with MyChart, go to [email or web address].    Your next appointment:   12 month(s)  The format for your next appointment:   In Person  Provider:   You may see Fransico Him, MD or one of the following Advanced Practice  Providers on your designated Care Team:    Melina Copa, PA-C  Ermalinda Barrios, PA-C    Other Instructions

## 2020-04-02 NOTE — Addendum Note (Signed)
Addended by: Thompson Grayer on: 04/02/2020 11:29 AM   Modules accepted: Orders

## 2020-04-02 NOTE — Telephone Encounter (Signed)
I placed call to patient to go over instructions from today's visit with Dr Radford Pax.  Left message to call office

## 2020-04-02 NOTE — Telephone Encounter (Signed)
Enrolled patient for a 14 day Zio XT  monitor to be mailed to patients home  °

## 2020-04-02 NOTE — Progress Notes (Signed)
Virtual Visit via Telephone Note   This visit type was conducted due to national recommendations for restrictions regarding the COVID-19 Pandemic (e.g. social distancing) in an effort to limit this patient's exposure and mitigate transmission in our community.  Due to his co-morbid illnesses, this patient is at least at moderate risk for complications without adequate follow up.  This format is felt to be most appropriate for this patient at this time.  The patient did not have access to video technology/had technical difficulties with video requiring transitioning to audio format only (telephone).  All issues noted in this document were discussed and addressed.  No physical exam could be performed with this format.  Please refer to the patient's chart for his  consent to telehealth for [facility].   Evaluation Performed:  Follow-up visit  This visit type was conducted due to national recommendations for restrictions regarding the COVID-19 Pandemic (e.g. social distancing).  This format is felt to be most appropriate for this patient at this time.  All issues noted in this document were discussed and addressed.  No physical exam was performed (except for noted visual exam findings with Video Visits).  Please refer to the patient's chart (MyChart message for video visits and phone note for telephone visits) for the patient's consent to telehealth for [facility].  Date:  04/02/2020   ID:  Blake [name], DOB 1971/01/19, MRN [id number]  Patient Location:  Home  Provider location:   [place]  PCP:  [name], NP  Cardiologist:  Fransico Him, MD    Referring MD: [name], NP   No chief complaint on file.   History of Present Illness:   Blake [name] is a 49 y.o. male who presents via audio/video conferencing for a telehealth visit today.    Blake [name] is a 49 y.o. male with a hx of morbid obesity, tobacco abuse, ED, tremor, PVCs, mildly dilated aorta.  Event  monitor 08/10/17 showed NSR with occasional PVCs. ETT 08/10/17 was negative (mildly impaired exercise tolerance), rare PVCs noted with exercise. 2D Echo 08/2018 showed moderate LVH, EF 60-65% and normal aortic root.  When I last saw him he was having some atypical chest pain and Lexiscan myoview was normal.    He is here today for followup and is doing well.  He tells me that he intermittently has tingling in his left arm and sometimes down into his finger tips and there is no rhyme or reason when it comes on.  He does say that sometimes he will have chest tightness with the tingling.  He says that sometimes he will break out in a sweat with it.  Sometimes he will have it associated with palpitations.  This is nonexertional.  He denies any SOB, DOE, PND, orthopnea, LE edema, dizziness or syncope. He is compliant with his meds and is tolerating meds with no SE.    He is doing well with his CPAP device and thinks that he has gotten used to it.  He tolerates the mask and feels the pressure is adequate.  Since going on CPAP he feels rested in the am and has no significant daytime sleepiness.  He denies any significant mouth or nasal dryness or nasal congestion.  He does not think that he snores.     Past Medical History:  Diagnosis Date  . Arthritis   . Back pain   . Dilated aortic root (HCC)    30mm ascending aorta by echo 09/2017, normal  on echo 08/2018  . ERECTILE DYSFUNCTION, MILD 11/12/2007   Qualifier: Diagnosis of  By: Carolyne Littles    . Head injury with loss of consciousness ([place]) 1997   unconsciousness  . Heart murmur    as a child  . History of blood transfusion    1997, received 15 units of blood   . Morbid obesity ([place])   . Neck pain   . PVC's (premature ventricular contractions)   . TOBACCO ABUSE 11/12/2007   Qualifier: Diagnosis of  By: Carmie End MD, Junie Panning    . Tremor    head and hands , controlled on cymbalta    Past Surgical History:  Procedure Laterality Date  . FACIAL FRACTURE SURGERY   09/1995   left side of face crush injury, carnial fracture   . LUMBAR LAMINECTOMY/DECOMPRESSION MICRODISCECTOMY N/A 11/25/2019   Procedure: BILATERAL PARTIAL LUMBAR HEMILAMINECTOMIES LUMBAR FOUR-FIVE;  Surgeon: Jessy Oto, MD;  Location: [place];  Service: Orthopedics;  Laterality: N/A;  . TOTAL HIP ARTHROPLASTY Right 09/07/2018   Procedure: RIGHT TOTAL HIP ARTHROPLASTY ANTERIOR APPROACH;  Surgeon: Mcarthur Rossetti, MD;  Location: [facility];  Service: Orthopedics;  Laterality: Right;    Current Medications: Current Meds  Medication Sig  . aspirin EC 81 MG tablet Take 1 tablet (81 mg total) by mouth daily.  Marland Kitchen atorvastatin (LIPITOR) 20 MG tablet Take 1 tablet (20 mg total) by mouth daily.  Marland Kitchen gabapentin (NEURONTIN) 600 MG tablet Take 2 tablets (1,200 mg total) by mouth 3 (three) times daily.  . propranolol ER (INDERAL LA) 60 MG 24 hr capsule Take 1 capsule (60 mg total) by mouth daily.  . sildenafil (VIAGRA) 100 MG tablet Take 0.5-1 tablets (50-100 mg total) by mouth daily as needed for erectile dysfunction.     Allergies:   Patient has no known allergies.   Social History   Socioeconomic History  . Marital status: Single    Spouse name: Not on file  . Number of children: 3  . Years of education: some college  . Highest education level: Not on file  Occupational History  . Occupation: waiting on disability  Tobacco Use  . Smoking status: Current Every Day Smoker    Packs/day: 0.50    Years: 33.00    Pack years: 16.50  . Smokeless tobacco: Never Used  Substance and Sexual Activity  . Alcohol use: Yes    Alcohol/week: 1.0 standard drink    Types: 1 Cans of beer per week  . Drug use: No  . Sexual activity: Not Currently  Other Topics Concern  . Not on file  Social History Narrative   Lives at home alone.   Right-handed.   No daily caffeine use.   Social Determinants of Health   Financial Resource Strain:   . Difficulty of Paying Living Expenses: Not on file  Food  Insecurity:   . Worried About Charity fundraiser in the Last Year: Not on file  . Ran Out of Food in the Last Year: Not on file  Transportation Needs:   . Lack of Transportation (Medical): Not on file  . Lack of Transportation (Non-Medical): Not on file  Physical Activity:   . Days of Exercise per Week: Not on file  . Minutes of Exercise per Session: Not on file  Stress:   . Feeling of Stress : Not on file  Social Connections:   . Frequency of Communication with Friends and Family: Not on file  . Frequency of Social Gatherings with  Friends and Family: Not on file  . Attends Religious Services: Not on file  . Active Member of Clubs or Organizations: Not on file  . Attends Archivist Meetings: Not on file  . Marital Status: Not on file     Family History: The patient's family history includes COPD in his mother; Diabetes in his sister; Hypertension in his sister; Leukemia in his father.  ROS:   Please see the history of present illness.    ROS  All other systems reviewed and negative.   EKGs/Labs/Other Studies Reviewed:    The following studies were reviewed today:  2D echo 08/30/2018 IMPRESSIONS    1. The left ventricle has normal systolic function with an ejection  fraction of 60-65%. The cavity size was normal. There is moderately  increased left ventricular wall thickness. Left ventricular diastolic  parameters were normal.  2. The right ventricle has normal systolic function. The cavity was  normal. There is no increase in right ventricular wall thickness.  3. Right atrial size was mildly dilated.  4. The mitral valve is normal in structure.  5. The tricuspid valve is normal in structure.  6. The aortic valve is normal in structure.  7. The aortic root and ascending aorta are normal in size and structure.  8. The interatrial septum was not assessed.   Nuclear Stress Test 06/2019 Study Highlights   Nuclear stress EF: 53%.  The left  ventricular ejection fraction is mildly decreased (45-54%).  There was no ST segment deviation noted during stress.  No T wave inversion was noted during stress.  Defect 1: There is a medium defect of moderate severity present in the basal inferior, mid inferior and apical inferior location.  Findings consistent with inferior ischemia. This pattern may also be seen in diaphragmatic attenuation.  This is an intermediate risk study.    Recent Labs: 11/21/2019: ALT 17 11/29/2019: BUN 7; Creatinine, Ser 0.92; Hemoglobin 14.8; Platelets 261; Potassium 4.1; Sodium 137   Recent Lipid Panel    Component Value Date/Time   CHOL 154 09/13/2019 1306   TRIG 146 09/13/2019 1306   HDL 40 09/13/2019 1306   CHOLHDL 3.9 09/13/2019 1306   CHOLHDL 3.1 Ratio 12/11/2007 2056   VLDL 22 12/11/2007 2056   LDLCALC 88 09/13/2019 1306    Physical Exam:    VS:  Ht 6\' 3"  (1.905 m)   Wt (!) 350 lb (158.8 kg)   BMI 43.75 kg/m     Wt Readings from Last 3 Encounters:  04/02/20 (!) 350 lb (158.8 kg)  02/06/20 (!) 360 lb (163.3 kg)  12/05/19 (!) 360 lb (163.3 kg)     ASSESSMENT:    1. PVC's (premature ventricular contractions)   2. Dilated aortic root ([place])   3. Morbid obesity (HCC)   4. Chest pain, unspecified type   5. OSA (obstructive sleep apnea)    PLAN:    In order of problems listed above:   1.  PVCs -he has had more palpitations and never completed the last heart monitor we ordered -continue Propranolol ER 60mg  daily -2D echo showed normal heart function with EF 60-65% -I will get a 2 week ziopatch to assess PVC load  2.  Morbid obesity -he is unable to exercise at present due to severe back pain  3.  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  OSA - The patient is tolerating PAP therapy well without any problems. The PAP download was reviewed today and showed an AHI of 9.6/hr on auto CPAP  with 3% compliance in using more than 4 hours nightly.  The patient has been using and benefiting from PAP use and will continue to benefit from therapy.  -I have encouraged him to be more compliant with his device -I will talk with his DME in regards to device saying he is not compliant as he says he is using it -I will order new PAP supplies  5.  HLD -LDL goal < 70 -LDL was 88 in March -repeat FLP and ALT -continue Lipitor 20mg  daily  COVID-19 Education: The signs and symptoms of COVID-19 were discussed with the patient and how to seek care for testing (follow up with PCP or arrange E-visit).  The importance of social distancing was discussed today.  Patient Risk:   After full review of this patient's clinical status, I feel that they are at least moderate risk at this time.  Time:   Today, I have spent 20 minutes on telemedicine discussing medical problems including OSA, CP, PVCs, obesity and reviewing patient's chart including 2D echo, Lexiscan myoview, Coronary CTA, PAP compliance download.   Medication Adjustments/Labs and Tests Ordered: Current medicines are reviewed at length with the patient today.  Concerns regarding medicines are outlined above.  No orders of the defined types were placed in this encounter.  No orders of the defined types were placed in this encounter.   Signed, Fransico Him, MD  04/02/2020 9:32 AM    [place]

## 2020-04-03 ENCOUNTER — Telehealth: Payer: Self-pay | Admitting: *Deleted

## 2020-04-03 DIAGNOSIS — G4733 Obstructive sleep apnea (adult) (pediatric): Secondary | ICD-10-CM

## 2020-04-03 NOTE — Telephone Encounter (Signed)
-----   Message from Sueanne Margarita, MD sent at 04/02/2020  9:46 AM EDT ----- Patient says he is using his PAP device and it is not recording his usage >>it shows he only used it 2 days this summer.  Please call DME to find out what is going on with his device and also order him PAP supplies  Traci

## 2020-04-03 NOTE — Telephone Encounter (Signed)
Patients dme [facility] says patient needs to bring his machine in to the store for trouble shooting. Patient notified lmtcb.. Supply order sent to [employer] by [facility].

## 2020-04-09 ENCOUNTER — Telehealth: Payer: Self-pay | Admitting: Neurology

## 2020-04-09 NOTE — Telephone Encounter (Signed)
Medical records faxed to [facility] at [phone] 04/09/2020.

## 2020-04-15 ENCOUNTER — Telehealth (HOSPITAL_COMMUNITY): Payer: Self-pay | Admitting: *Deleted

## 2020-04-15 ENCOUNTER — Encounter (HOSPITAL_COMMUNITY): Payer: Self-pay | Admitting: *Deleted

## 2020-04-15 NOTE — Telephone Encounter (Signed)
Patient given detailed instructions per Myocardial Perfusion Study Information Sheet for the test on 04/22/2020 at 1300. Patient notified to arrive 15 minutes early and that it is imperative to arrive on time for appointment to keep from having the test rescheduled.  If you need to cancel or reschedule your appointment, please call the office within 24 hours of your appointment. . Patient verbalized understanding.Maesyn Frisinger, Ranae Palms Mychart letter sent with instructions/

## 2020-04-17 ENCOUNTER — Encounter (INDEPENDENT_AMBULATORY_CARE_PROVIDER_SITE_OTHER): Payer: BLUE CROSS/BLUE SHIELD | Admitting: Primary Care

## 2020-04-22 ENCOUNTER — Other Ambulatory Visit: Payer: Self-pay

## 2020-04-22 ENCOUNTER — Telehealth: Payer: Self-pay | Admitting: Specialist

## 2020-04-22 ENCOUNTER — Ambulatory Visit (HOSPITAL_COMMUNITY): Payer: BLUE CROSS/BLUE SHIELD | Attending: Cardiology

## 2020-04-22 DIAGNOSIS — R079 Chest pain, unspecified: Secondary | ICD-10-CM | POA: Insufficient documentation

## 2020-04-22 MED ORDER — TECHNETIUM TC 99M TETROFOSMIN IV KIT
32.8000 | PACK | Freq: Once | INTRAVENOUS | Status: AC | PRN
  Administered 2020-04-22: 32.8 via INTRAVENOUS
  Filled 2020-04-22: qty 33

## 2020-04-22 MED ORDER — REGADENOSON 0.4 MG/5ML IV SOLN
0.4000 mg | Freq: Once | INTRAVENOUS | Status: AC
  Administered 2020-04-22: 0.4 mg via INTRAVENOUS

## 2020-04-22 NOTE — Telephone Encounter (Signed)
Patient called asked if he can be added to the wait list for an earlier appointment with Dr. [name]?  The number to contact patient is [phone]

## 2020-04-23 ENCOUNTER — Other Ambulatory Visit: Payer: BLUE CROSS/BLUE SHIELD | Admitting: *Deleted

## 2020-04-23 ENCOUNTER — Ambulatory Visit (HOSPITAL_COMMUNITY): Payer: BLUE CROSS/BLUE SHIELD | Attending: Cardiology

## 2020-04-23 ENCOUNTER — Ambulatory Visit (HOSPITAL_COMMUNITY): Payer: BLUE CROSS/BLUE SHIELD

## 2020-04-23 DIAGNOSIS — R079 Chest pain, unspecified: Secondary | ICD-10-CM

## 2020-04-23 LAB — MYOCARDIAL PERFUSION IMAGING
LV dias vol: 108 mL (ref 62–150)
LV sys vol: 48 mL
Peak HR: 115 {beats}/min
Rest HR: 88 {beats}/min
SDS: 0
SRS: 2
SSS: 2
TID: 0.93

## 2020-04-23 LAB — LIPID PANEL
Chol/HDL Ratio: 3.4 ratio (ref 0.0–5.0)
Cholesterol, Total: 155 mg/dL (ref 100–199)
HDL: 46 mg/dL (ref >39)
LDL Chol Calc (NIH): 91 mg/dL (ref 0–99)
Triglycerides: 96 mg/dL (ref 0–149)
VLDL Cholesterol Cal: 18 mg/dL (ref 5–40)

## 2020-04-23 LAB — ALT: ALT: 15 IU/L (ref 0–44)

## 2020-04-23 MED ORDER — TECHNETIUM TC 99M TETROFOSMIN IV KIT
32.9000 | PACK | Freq: Once | INTRAVENOUS | Status: AC | PRN
  Administered 2020-04-23: 32.9 via INTRAVENOUS
  Filled 2020-04-23: qty 33

## 2020-04-27 ENCOUNTER — Ambulatory Visit (INDEPENDENT_AMBULATORY_CARE_PROVIDER_SITE_OTHER): Payer: BLUE CROSS/BLUE SHIELD | Admitting: Primary Care

## 2020-04-27 ENCOUNTER — Other Ambulatory Visit: Payer: Self-pay | Admitting: Cardiology

## 2020-04-27 ENCOUNTER — Other Ambulatory Visit: Payer: Self-pay

## 2020-04-27 ENCOUNTER — Telehealth: Payer: Self-pay

## 2020-04-27 DIAGNOSIS — H6123 Impacted cerumen, bilateral: Secondary | ICD-10-CM

## 2020-04-27 DIAGNOSIS — M25542 Pain in joints of left hand: Secondary | ICD-10-CM

## 2020-04-27 DIAGNOSIS — Z23 Encounter for immunization: Secondary | ICD-10-CM

## 2020-04-27 DIAGNOSIS — R079 Chest pain, unspecified: Secondary | ICD-10-CM

## 2020-04-27 DIAGNOSIS — Z0000 Encounter for general adult medical examination without abnormal findings: Secondary | ICD-10-CM

## 2020-04-27 DIAGNOSIS — Z0001 Encounter for general adult medical examination with abnormal findings: Secondary | ICD-10-CM | POA: Diagnosis not present

## 2020-04-27 DIAGNOSIS — M25541 Pain in joints of right hand: Secondary | ICD-10-CM

## 2020-04-27 DIAGNOSIS — Z79899 Other long term (current) drug therapy: Secondary | ICD-10-CM

## 2020-04-27 MED ORDER — ATORVASTATIN CALCIUM 40 MG PO TABS: 40.0000 mg | ORAL_TABLET | Freq: Every day | ORAL | 3 refills | Status: DC

## 2020-04-27 MED FILL — ATORVASTATIN CALCIUM 40 MG: 40 | 90 days supply | Qty: 90 | Fill #0

## 2020-04-27 NOTE — Progress Notes (Signed)
Blake [name] is a 49 y.o. male presents to office today for annual physical exam examination.    Concerns today include: 1. Arthritis in hands is getting worst -pain 7/10 2. Calf cramping  3. Eyes itching  Occupation: unable to work at this time , Marital status: Single Substance use: no Diet: no referred to a weight loss clinic , Exercise: no Last eye exam: none  Last dental exam: 2 years ago  Last colonoscopy: start at age 56  Refills needed today: yes    Past Medical History:  Diagnosis Date  . Arthritis   . Back pain   . Dilated aortic root (HCC)    46m ascending aorta by echo 09/2017, normal on echo 08/2018  . ERECTILE DYSFUNCTION, MILD 11/12/2007   Qualifier: Diagnosis of  By: Blake [name]   . [place] of consciousness [place] 1997   unconsciousness  . Heart murmur    as a child  . History of blood transfusion    1997, received 15 units of blood   . [place]   . Neck pain   . PVC's (premature ventricular contractions)   . TOBACCO ABUSE 11/12/2007   Qualifier: Diagnosis of  By: Blake [name], Blake [name]   . Tremor    head and hands , controlled on cymbalta   Social History   Socioeconomic History  . Marital status: Single    Spouse name: Not on file  . Number of children: 3  . Years of education: some college  . Highest education level: Not on file  Occupational History  . Occupation: waiting on disability  Tobacco Use  . Smoking status: Current Every Day Smoker    Packs/day: 0.50    Years: 33.00    Pack years: 16.50  . Smokeless tobacco: Never Used  Substance and Sexual Activity  . Alcohol use: Yes    Alcohol/week: 1.0 standard drink    Types: 1 Cans of beer per week  . Drug use: No  . Sexual activity: Not Currently  Other Topics Concern  . Not on file  Social History Narrative   Lives at home alone.   Right-handed.   No daily caffeine use.   Social Determinants of Health   Financial Resource Strain:   . Difficulty of Paying  Living Expenses: Not on file  Food Insecurity:   . Worried About RCharity fundraiserin the Last Year: Not on file  . Ran Out of Food in the Last Year: Not on file  Transportation Needs:   . Lack of Transportation (Medical): Not on file  . Lack of Transportation (Non-Medical): Not on file  Physical Activity:   . Days of Exercise per Week: Not on file  . Minutes of Exercise per Session: Not on file  Stress:   . Feeling of Stress : Not on file  Social Connections:   . Frequency of Communication with Friends and Family: Not on file  . Frequency of Social Gatherings with Friends and Family: Not on file  . Attends Religious Services: Not on file  . Active Member of Clubs or Organizations: Not on file  . Attends CArchivistMeetings: Not on file  . Marital Status: Not on file  Intimate Partner Violence:   . Fear of Current or Ex-Partner: Not on file  . Emotionally Abused: Not on file  . Physically Abused: Not on file  . Sexually Abused: Not on file   Past Surgical History:  Procedure Laterality Date  . FACIAL FRACTURE SURGERY  09/1995   left side of face crush injury, carnial fracture   . LUMBAR LAMINECTOMY/DECOMPRESSION MICRODISCECTOMY N/A 11/25/2019   Procedure: BILATERAL PARTIAL LUMBAR HEMILAMINECTOMIES LUMBAR FOUR-FIVE;  Surgeon: Blake Oto, MD;  Location: [place];  Service: Orthopedics;  Laterality: N/A;  . TOTAL HIP ARTHROPLASTY Right 09/07/2018   Procedure: RIGHT TOTAL HIP ARTHROPLASTY ANTERIOR APPROACH;  Surgeon: Blake Rossetti, MD;  [facility];  Service: Orthopedics;  Laterality: Right;   Family History  Problem Relation Age of Onset  . COPD Mother   . Leukemia Father   . Hypertension Sister   . Diabetes Sister     Current Outpatient Medications:  .  atorvastatin (LIPITOR) 20 MG tablet, Take 1 tablet (20 mg total) by mouth daily., Disp: 90 tablet, Rfl: 3 .  sildenafil (VIAGRA) 100 MG tablet, Take 0.5-1 tablets (50-100 mg total) by mouth daily as  needed for erectile dysfunction., Disp: 10 tablet, Rfl: 3 .  aspirin EC 81 MG tablet, Take 1 tablet (81 mg total) by mouth daily., Disp: 90 tablet, Rfl: 3 .  gabapentin (NEURONTIN) 600 MG tablet, Take 2 tablets (1,200 mg total) by mouth 3 (three) times daily. (Patient not taking: Reported on 04/27/2020), Disp: 180 tablet, Rfl: 0 .  propranolol ER (INDERAL LA) 60 MG 24 hr capsule, Take 1 capsule (60 mg total) by mouth daily. (Patient not taking: Reported on 04/27/2020), Disp: 30 capsule, Rfl: 3  No Known Allergies   ROS: Review of Systems Musculoskeletal:positive for back pain, bone pain and hip pain , right leg     Physical exam   Physical Exam Vitals reviewed.  Constitutional:      Appearance: He is obese.     Comments: morbid  HENT:     Head: Normocephalic.     Right Ear: External ear normal. There is impacted cerumen.     Left Ear: External ear normal. There is impacted cerumen.     Nose: Nose normal.  Eyes:     Extraocular Movements: Extraocular movements intact.     Pupils: Pupils are equal, round, and reactive to light.  Cardiovascular:     Rate and Rhythm: Normal rate and regular rhythm.  Pulmonary:     Effort: Pulmonary effort is normal.     Breath sounds: Normal breath sounds.  Abdominal:     General: Bowel sounds are normal. There is distension.     Palpations: Abdomen is soft.  Musculoskeletal:     Cervical back: Normal range of motion.     Comments: Decrease unstable gait uses cane hx of back surgery THR-right and   Skin:    General: Skin is warm.  Neurological:     Mental Status: He is oriented to person, place, and time.  Psychiatric:        Mood and Affect: Mood normal.        Behavior: Behavior normal.        Thought Content: Thought content normal.        Judgment: Judgment normal.     Assessment/ Plan: Blake [name] here for annual physical exam.  Blake [name] was seen today for annual exam.  Diagnoses and all orders for this visit:  Morbidly obese  (Blake [name]) -     Amb Ref to Medical Weight Management  Annual physical exam -     Flu Vaccine QUAD with presevative (Fluzone Quad) -     CBC with Differential -     CMP14+EGFR  Encounter for general adult medical examination with abnormal findings -     CBC with Differential -     CMP14+EGFR  Bilateral impacted cerumen Return for ear irrigation   Arthralgia of both hands -     Rheumatoid Arthritis Profile   No problem-specific Assessment & Plan notes found for this encounter.   Counseled on healthy lifestyle choices, including diet (rich in fruits, vegetables and lean meats and low in salt and simple carbohydrates) and exercise (at least 30 minutes of moderate physical activity daily).  Patient to follow up in 1 year for annual exam or sooner if needed.  The above assessment and management plan was discussed with the patient. The patient verbalized understanding of and has agreed to the management plan. Patient is aware to call the clinic if symptoms persist or worsen. Patient is aware when to return to the clinic for a follow-up visit. Patient educated on when it is appropriate to go to the emergency department.   Juluis Mire NP-C [address] [place] [phone]

## 2020-04-27 NOTE — Patient Instructions (Addendum)
Influenza, Adult Influenza is also called "the flu." It is an infection in the lungs, nose, and throat (respiratory tract). It is caused by a virus. The flu causes symptoms that are similar to symptoms of a cold. It also causes a high fever and body aches. The flu spreads easily from person to person (is contagious). Getting a flu shot (influenza vaccination) every year is the best way to prevent the flu. What are the causes? This condition is caused by the influenza virus. You can get the virus by:  Breathing in droplets that are in the air from the cough or sneeze of a person who has the virus.  Touching something that has the virus on it (is contaminated) and then touching your mouth, nose, or eyes. What increases the risk? Certain things may make you more likely to get the flu. These include:  Not washing your hands often.  Having close contact with many people during cold and flu season.  Touching your mouth, eyes, or nose without first washing your hands.  Not getting a flu shot every year. You may have a higher risk for the flu, along with serious problems such as a lung infection (pneumonia), if you:  Are older than 65.  Are pregnant.  Have a weakened disease-fighting system (immune system) because of a disease or taking certain medicines.  Have a long-term (chronic) illness, such as: ? Heart, kidney, or lung disease. ? Diabetes. ? Asthma.  Have a liver disorder.  Are very overweight (morbidly obese).  Have anemia. This is a condition that affects your red blood cells. What are the signs or symptoms? Symptoms usually begin suddenly and last 4-14 days. They may include:  Fever and chills.  Headaches, body aches, or muscle aches.  Sore throat.  Cough.  Runny or stuffy (congested) nose.  Chest discomfort.  Not wanting to eat as much as normal (poor appetite).  Weakness or feeling tired (fatigue).  Dizziness.  Feeling sick to your stomach (nauseous) or  throwing up (vomiting). How is this treated? If the flu is found early, you can be treated with medicine that can help reduce how bad the illness is and how long it lasts (antiviral medicine). This may be given by mouth (orally) or through an IV tube. Taking care of yourself at home can help your symptoms get better. Your doctor may suggest:  Taking over-the-counter medicines.  Drinking plenty of fluids. The flu often goes away on its own. If you have very bad symptoms or other problems, you may be treated in a hospital. Follow these instructions at home:     Activity  Rest as needed. Get plenty of sleep.  Stay home from work or school as told by your doctor. ? Do not leave home until you do not have a fever for 24 hours without taking medicine. ? Leave home only to visit your doctor. Eating and drinking  Take an ORS (oral rehydration solution). This is a drink that is sold at pharmacies and stores.  Drink enough fluid to keep your pee (urine) pale yellow.  Drink clear fluids in small amounts as you are able. Clear fluids include: ? Water. ? Ice chips. ? Fruit juice that has water added (diluted fruit juice). ? Low-calorie sports drinks.  Eat bland, easy-to-digest foods in small amounts as you are able. These foods include: ? Bananas. ? Applesauce. ? Rice. ? Lean meats. ? Toast. ? Crackers.  Do not eat or drink: ? Fluids that have a lot   of sugar or caffeine. ? Alcohol. ? Spicy or fatty foods. General instructions  Take over-the-counter and prescription medicines only as told by your doctor.  Use a cool mist humidifier to add moisture to the air in your home. This can make it easier for you to breathe.  Cover your mouth and nose when you cough or sneeze.  Wash your hands with soap and water often, especially after you cough or sneeze. If you cannot use soap and water, use alcohol-based hand sanitizer.  Keep all follow-up visits as told by your doctor. This is  important. How is this prevented?   Get a flu shot every year. You may get the flu shot in late summer, fall, or winter. Ask your doctor when you should get your flu shot.  Avoid contact with people who are sick during fall and winter (cold and flu season). Contact a doctor if:  You get new symptoms.  You have: ? Chest pain. ? Watery poop (diarrhea). ? A fever.  Your cough gets worse.  You start to have more mucus.  You feel sick to your stomach.  You throw up. Get help right away if you:  Have shortness of breath.  Have trouble breathing.  Have skin or nails that turn a bluish color.  Have very bad pain or stiffness in your neck.  Get a sudden headache.  Get sudden pain in your face or ear.  Cannot eat or drink without throwing up. Summary  Influenza ("the flu") is an infection in the lungs, nose, and throat. It is caused by a virus.  Take over-the-counter and prescription medicines only as told by your doctor.  Getting a flu shot every year is the best way to avoid getting the flu. This information is not intended to replace advice given to you by your health care provider. Make sure you discuss any questions you have with your health care provider. Document Revised: 12/13/2017 Document Reviewed: 12/13/2017 Elsevier Patient Education  [place] Maintenance, Male Adopting a healthy lifestyle and getting preventive care are important in promoting health and wellness. Ask your health care provider about:  The right schedule for you to have regular tests and exams.  Things you can do on your own to prevent diseases and keep yourself healthy. What should I know about diet, weight, and exercise? Eat a healthy diet   Eat a diet that includes plenty of vegetables, fruits, low-fat dairy products, and lean protein.  Do not eat a lot of foods that are high in solid fats, added sugars, or sodium. Maintain a healthy weight Body mass index (BMI)  is a measurement that can be used to identify possible weight problems. It estimates body fat based on height and weight. Your health care provider can help determine your BMI and help you achieve or maintain a healthy weight. Get regular exercise Get regular exercise. This is one of the most important things you can do for your health. Most adults should:  Exercise for at least 150 minutes each week. The exercise should increase your heart rate and make you sweat (moderate-intensity exercise).  Do strengthening exercises at least twice a week. This is in addition to the moderate-intensity exercise.  Spend less time sitting. Even light physical activity can be beneficial. Watch cholesterol and blood lipids Have your blood tested for lipids and cholesterol at 49 years of age, then have this test every 5 years. You may need to have your cholesterol levels checked more  often if:  Your lipid or cholesterol levels are high.  You are older than 49 years of age.  You are at high risk for heart disease. What should I know about cancer screening? Many types of cancers can be detected early and may often be prevented. Depending on your health history and family history, you may need to have cancer screening at various ages. This may include screening for:  Colorectal cancer.  Prostate cancer.  Skin cancer.  Lung cancer. What should I know about heart disease, diabetes, and high blood pressure? Blood pressure and heart disease  High blood pressure causes heart disease and increases the risk of stroke. This is more likely to develop in people who have high blood pressure readings, are of African descent, or are overweight.  Talk with your health care provider about your target blood pressure readings.  Have your blood pressure checked: ? Every 3-5 years if you are 51-9 years of age. ? Every year if you are 43 years old or older.  If you are between the ages of 5 and 8 and are a current  or former smoker, ask your health care provider if you should have a one-time screening for abdominal aortic aneurysm (AAA). Diabetes Have regular diabetes screenings. This checks your fasting blood sugar level. Have the screening done:  Once every three years after age 70 if you are at a normal weight and have a low risk for diabetes.  More often and at a younger age if you are overweight or have a high risk for diabetes. What should I know about preventing infection? Hepatitis B If you have a higher risk for hepatitis B, you should be screened for this virus. Talk with your health care provider to find out if you are at risk for hepatitis B infection. Hepatitis C Blood testing is recommended for:  Everyone born from [birth year] through 1965.  Anyone with known risk factors for hepatitis C. Sexually transmitted infections (STIs)  You should be screened each year for STIs, including gonorrhea and chlamydia, if: ? You are sexually active and are younger than 49 years of age. ? You are older than 49 years of age and your health care provider tells you that you are at risk for this type of infection. ? Your sexual activity has changed since you were last screened, and you are at increased risk for chlamydia or gonorrhea. Ask your health care provider if you are at risk.  Ask your health care provider about whether you are at high risk for HIV. Your health care provider may recommend a prescription medicine to help prevent HIV infection. If you choose to take medicine to prevent HIV, you should first get tested for HIV. You should then be tested every 3 months for as long as you are taking the medicine. Follow these instructions at home: Lifestyle  Do not use any products that contain nicotine or tobacco, such as cigarettes, e-cigarettes, and chewing tobacco. If you need help quitting, ask your health care provider.  Do not use street drugs.  Do not share needles.  Ask your health care provider  for help if you need support or information about quitting drugs. Alcohol use  Do not drink alcohol if your health care provider tells you not to drink.  If you drink alcohol: ? Limit how much you have to 0-2 drinks a day. ? Be aware of how much alcohol is in your drink. In the [place] one drink equals one 12 oz  bottle of beer (355 mL), one 5 oz glass of wine (148 mL), or one 1 oz glass of hard liquor (44 mL). General instructions  Schedule regular health, dental, and eye exams.  Stay current with your vaccines.  Tell your health care provider if: ? You often feel depressed. ? You have ever been abused or do not feel safe at home. Summary  Adopting a healthy lifestyle and getting preventive care are important in promoting health and wellness.  Follow your health care provider's instructions about healthy diet, exercising, and getting tested or screened for diseases.  Follow your health care provider's instructions on monitoring your cholesterol and blood pressure. This information is not intended to replace advice given to you by your health care provider. Make sure you discuss any questions you have with your health care provider. Document Revised: 06/20/2018 Document Reviewed: 06/20/2018 Elsevier Patient Education  2020 [employer].

## 2020-04-27 NOTE — Telephone Encounter (Signed)
-----   Message from Sueanne Margarita, MD sent at 04/25/2020  7:36 PM EDT ----- LDL too high - increase Lipitor to 40mg  daily and repeat FLP and ALTin 6 weeks

## 2020-04-27 NOTE — Telephone Encounter (Signed)
The patient has been notified of the result and verbalized understanding.  All questions (if any) were answered. Antonieta Iba, RN 04/27/2020 12:16 PM  Patient will increase atorvastatin to 40 mg daily and repeat lab work 12/01

## 2020-04-28 ENCOUNTER — Encounter: Payer: Self-pay | Admitting: Orthopaedic Surgery

## 2020-04-28 ENCOUNTER — Ambulatory Visit (INDEPENDENT_AMBULATORY_CARE_PROVIDER_SITE_OTHER): Payer: BLUE CROSS/BLUE SHIELD | Admitting: Orthopaedic Surgery

## 2020-04-28 ENCOUNTER — Ambulatory Visit: Payer: Self-pay

## 2020-04-28 ENCOUNTER — Ambulatory Visit: Payer: BLUE CROSS/BLUE SHIELD | Admitting: Orthopaedic Surgery

## 2020-04-28 DIAGNOSIS — Z96641 Presence of right artificial hip joint: Secondary | ICD-10-CM | POA: Diagnosis not present

## 2020-04-28 MED ORDER — METHYLPREDNISOLONE ACETATE 40 MG/ML IJ SUSP
40.0000 mg | INTRAMUSCULAR | Status: AC | PRN
  Administered 2020-04-28: 40 mg via INTRA_ARTICULAR

## 2020-04-28 MED ORDER — LIDOCAINE HCL 1 % IJ SOLN
3.0000 mL | INTRAMUSCULAR | Status: AC | PRN
  Administered 2020-04-28: 3 mL

## 2020-04-28 NOTE — Progress Notes (Signed)
Office Visit Note   Patient: Blake [name]           Date of Birth: 1971-03-29           MRN: [id number] Visit Date: 04/28/2020              Requested by: [name], NP [address] [place],  [place] [address] PCP: [name], NP   Assessment & Plan: Visit Diagnoses:  1. History of total right hip replacement     Plan: Patient tolerated trochanteric injection well today.  States that the can feel some relief of the pain lateral aspect of the hip after the injection.  He shown IT band stretching activities.  He will follow up with Dr. Donavan Burnet for the numbness tingling down the leg.  Questions were encouraged and answered.  Follow-Up Instructions: No follow-ups on file.   Orders:  Orders Placed This Encounter  Procedures  . XR HIP UNILAT W OR W/O PELVIS 2-3 VIEWS RIGHT   No orders of the defined types were placed in this encounter.     Procedures: Large Joint Inj: R greater trochanter on 04/28/2020 10:45 AM Indications: pain Details: 22 G 3.5 in needle, lateral approach  Arthrogram: No  Medications: 3 mL lidocaine 1 %; 40 mg methylPREDNISolone acetate 40 MG/ML Outcome: tolerated well, no immediate complications Procedure, treatment alternatives, risks and benefits explained, specific risks discussed. Consent was given by the patient. Immediately prior to procedure a time out was called to verify the correct patient, procedure, equipment, support staff and site/side marked as required. Patient was prepped and draped in the usual sterile fashion.       Clinical Data: No additional findings.   Subjective: Chief Complaint  Patient presents with  . Right Hip - Pain    HPI Mr. Yolanda Bonine comes in today due to pain lateral aspect of his right hip.  He had a right total hip arthroplasty performed 09/25/2018 by Dr. Ninfa Linden.  States the pain is similar to what he had prior to his hip surgery.  Has had recent back surgery by Dr. [name].  He is having some  radicular pain down his leg into his foot but states his main pain is the achy pain in the lateral aspect of the right hip.  He has had no new injury.  Nondiabetic Review of Systems Negative for fevers chills.  Objective: Vital Signs: There were no vitals taken for this visit.  Physical Exam General: Well-developed well-nourished male in no acute distress. Ortho Exam Right hip excellent range of motion some discomfort with external rotation of the right hip.  He has tenderness over the right trochanteric region and down the iliotibial band. Specialty Comments:  No specialty comments available.  Imaging: XR HIP UNILAT W OR W/O PELVIS 2-3 VIEWS RIGHT  Result Date: 04/28/2020 Right lateral hip and AP pelvis: Bilateral hips well located.  Right total hip arthroplasty components well-seated.  No signs acute fractures or hardware failure.    PMFS History: Patient Active Problem List   Diagnosis Date Noted  . Spinal stenosis, lumbar region with neurogenic claudication 11/25/2019    Class: Chronic  . Spinal stenosis of lumbar region with neurogenic claudication 11/25/2019  . Cervical dystonia 07/09/2019  . Neck pain 07/09/2019  . OSA (obstructive sleep apnea) 10/31/2018  . Status post total replacement of right hip 09/07/2018  . Unilateral primary osteoarthritis, right hip 08/13/2018  . Severe obesity (BMI >= 40) ([facility]) 08/13/2018  . Chronic bilateral low  back pain with bilateral sciatica 04/02/2018  . Epidural lipomatosis 04/02/2018  . Right hip pain 04/02/2018  . Preop cardiovascular exam 03/30/2018  . Dilated aortic root ([facility])   . Chest pain 07/27/2017  . Palpitations 07/26/2017  . ERECTILE DYSFUNCTION, MILD 11/12/2007  . Tobacco abuse 11/12/2007   Past Medical History:  Diagnosis Date  . Arthritis   . Back pain   . Dilated aortic root ([facility])    64mm ascending aorta by echo 09/2017, normal on echo 08/2018  . ERECTILE DYSFUNCTION, MILD 11/12/2007   Qualifier: Diagnosis of  By:  Carolyne Littles    . Head injury with loss of consciousness ([facility]) 1997   unconsciousness  . Heart murmur    as a child  . History of blood transfusion    1997, received 15 units of blood   . Morbid obesity ([facility])   . Neck pain   . PVC's (premature ventricular contractions)   . TOBACCO ABUSE 11/12/2007   Qualifier: Diagnosis of  By: Carmie End MD, Junie Panning    . Tremor    head and hands , controlled on cymbalta    Family History  Problem Relation Age of Onset  . COPD Mother   . Leukemia Father   . Hypertension Sister   . Diabetes Sister     Past Surgical History:  Procedure Laterality Date  . FACIAL FRACTURE SURGERY  09/1995   left side of face crush injury, carnial fracture   . LUMBAR LAMINECTOMY/DECOMPRESSION MICRODISCECTOMY N/A 11/25/2019   Procedure: BILATERAL PARTIAL LUMBAR HEMILAMINECTOMIES LUMBAR FOUR-FIVE;  Surgeon: Jessy Oto, MD;  Location: [facility];  Service: Orthopedics;  Laterality: N/A;  . TOTAL HIP ARTHROPLASTY Right 09/07/2018   Procedure: RIGHT TOTAL HIP ARTHROPLASTY ANTERIOR APPROACH;  Surgeon: Mcarthur Rossetti, MD;  Location: [facility];  Service: Orthopedics;  Laterality: Right;   Social History   Occupational History  . Occupation: waiting on disability  Tobacco Use  . Smoking status: Current Every Day Smoker    Packs/day: 0.50    Years: 33.00    Pack years: 16.50  . Smokeless tobacco: Never Used  Substance and Sexual Activity  . Alcohol use: Yes    Alcohol/week: 1.0 standard drink    Types: 1 Cans of beer per week  . Drug use: No  . Sexual activity: Not Currently

## 2020-04-29 LAB — CBC WITH DIFFERENTIAL/PLATELET
Basophils Absolute: 0.1 10*3/uL (ref 0.0–0.2)
Basos: 1 %
EOS (ABSOLUTE): 0.1 10*3/uL (ref 0.0–0.4)
Eos: 1 %
Hematocrit: 47.6 % (ref 37.5–51.0)
Hemoglobin: 16.3 g/dL (ref 13.0–17.7)
Immature Grans (Abs): 0 10*3/uL (ref 0.0–0.1)
Immature Granulocytes: 0 %
Lymphocytes Absolute: 2 10*3/uL (ref 0.7–3.1)
Lymphs: 22 %
MCH: 31.5 pg (ref 26.6–33.0)
MCHC: 34.2 g/dL (ref 31.5–35.7)
MCV: 92 fL (ref 79–97)
Monocytes Absolute: 0.8 10*3/uL (ref 0.1–0.9)
Monocytes: 9 %
Neutrophils Absolute: 5.9 10*3/uL (ref 1.4–7.0)
Neutrophils: 67 %
Platelets: 281 10*3/uL (ref 150–450)
RBC: 5.17 x10E6/uL (ref 4.14–5.80)
RDW: 14.1 % (ref 11.6–15.4)
WBC: 8.8 10*3/uL (ref 3.4–10.8)

## 2020-04-29 LAB — CMP14+EGFR
ALT: 23 IU/L (ref 0–44)
AST: 19 IU/L (ref 0–40)
Albumin/Globulin Ratio: 1.6 (ref 1.2–2.2)
Albumin: 4.7 g/dL (ref 4.0–5.0)
Alkaline Phosphatase: 91 IU/L (ref 44–121)
BUN/Creatinine Ratio: 11 (ref 9–20)
BUN: 12 mg/dL (ref 6–24)
Bilirubin Total: 0.3 mg/dL (ref 0.0–1.2)
CO2: 24 mmol/L (ref 20–29)
Calcium: 9.7 mg/dL (ref 8.7–10.2)
Chloride: 100 mmol/L (ref 96–106)
Creatinine, Ser: 1.05 mg/dL (ref 0.76–1.27)
GFR calc Af Amer: 96 mL/min/{1.73_m2} (ref >59)
GFR calc non Af Amer: 83 mL/min/{1.73_m2} (ref >59)
Globulin, Total: 3 g/dL (ref 1.5–4.5)
Glucose: 90 mg/dL (ref 65–99)
Potassium: 4.8 mmol/L (ref 3.5–5.2)
Sodium: 138 mmol/L (ref 134–144)
Total Protein: 7.7 g/dL (ref 6.0–8.5)

## 2020-04-29 LAB — RHEUMATOID ARTHRITIS PROFILE
Cyclic Citrullin Peptide Ab: 10 units (ref 0–19)
Rheumatoid fact SerPl-aCnc: <10 IU/mL (ref 0.0–13.9)

## 2020-05-18 ENCOUNTER — Encounter: Payer: Self-pay | Admitting: Specialist

## 2020-05-18 ENCOUNTER — Ambulatory Visit (INDEPENDENT_AMBULATORY_CARE_PROVIDER_SITE_OTHER): Payer: BLUE CROSS/BLUE SHIELD | Admitting: Specialist

## 2020-05-18 ENCOUNTER — Other Ambulatory Visit: Payer: Self-pay

## 2020-05-18 VITALS — BP 121/77 | HR 83 | Ht 75.0 in | Wt 348.0 lb

## 2020-05-18 DIAGNOSIS — M48062 Spinal stenosis, lumbar region with neurogenic claudication: Secondary | ICD-10-CM

## 2020-05-18 DIAGNOSIS — M4807 Spinal stenosis, lumbosacral region: Secondary | ICD-10-CM

## 2020-05-18 DIAGNOSIS — D1779 Benign lipomatous neoplasm of other sites: Secondary | ICD-10-CM

## 2020-05-18 MED ORDER — METHYLPREDNISOLONE 4 MG PO TBPK: ORAL_TABLET | ORAL | 0 refills | Status: DC

## 2020-05-18 MED ORDER — PREGABALIN 75 MG PO CAPS
75.0000 mg | ORAL_CAPSULE | Freq: Two times a day (BID) | ORAL | 2 refills | Status: DC
Start: 2020-05-18 — End: 2022-04-26

## 2020-05-18 NOTE — Progress Notes (Signed)
Office Visit Note   Patient: Blake [name]           Date of Birth: 01-12-1971           MRN: [id number] Visit Date: 05/18/2020              Requested by: [name], NP [address] [place],  [place] [address] PCP: [name], NP   Assessment & Plan: Visit Diagnoses:  1. Spinal stenosis of lumbosacral region   2. Spinal stenosis of lumbar region with neurogenic claudication   3. Epidural lipomatosis     Plan: Avoid bending, stooping and avoid lifting weights greater than 10 lbs. Avoid prolong standing and walking. Avoid frequent bending and stooping  No lifting greater than 10 lbs. May use ice or moist heat for pain. Weight loss is of benefit. Handicap license is approved. Start lyrica 75 mg BID for nerve pain Start 6 day medrol dose pak. Return in 4 weeks  Consider PT for core strengthening. Weight loss program.  Follow-Up Instructions: Return in about 4 weeks (around 06/15/2020).   Orders:  No orders of the defined types were placed in this encounter.  No orders of the defined types were placed in this encounter.     Procedures: No procedures performed   Clinical Data: No additional findings.   Subjective: Chief Complaint  Patient presents with  . Lower Back - Follow-up    Mri review Lumbar    49 year old male with history of back pain and radiaiton into the posterior thighs. Since the surgery the numbness from the waist down is better but he is still having some tingling and numbness in the thighs. The pain is such that he is not able to bend at the waist. The spasm in the back is severe and there is stiffness up throught the neck. There is stiffness into the Shoulders. No bowel or bladder difficulty. There is pain with walking, almost feels like the back is in spasm.   Review of Systems  Constitutional: Negative.   HENT: Negative.   Eyes: Negative.   Respiratory: Negative.   Cardiovascular: Negative.   Gastrointestinal:  Negative.   Endocrine: Negative.   Genitourinary: Negative.   Musculoskeletal: Negative.   Skin: Negative.   Allergic/Immunologic: Negative.   Neurological: Negative.   Hematological: Negative.   Psychiatric/Behavioral: Negative.      Objective: Vital Signs: BP 121/77 (BP Location: Left Arm, Patient Position: Sitting)   Pulse 83   Ht 6\' 3"  (1.905 m)   Wt (!) 348 lb (157.9 kg)   BMI 43.50 kg/m   Physical Exam Constitutional:      Appearance: He is well-developed.  HENT:     Head: Normocephalic and atraumatic.  Eyes:     Pupils: Pupils are equal, round, and reactive to light.  Pulmonary:     Effort: Pulmonary effort is normal.     Breath sounds: Normal breath sounds.  Abdominal:     General: Bowel sounds are normal.     Palpations: Abdomen is soft.  Musculoskeletal:     Cervical back: Normal range of motion and neck supple.  Skin:    General: Skin is warm and dry.  Neurological:     Mental Status: He is alert and oriented to person, place, and time.  Psychiatric:        Behavior: Behavior normal.        Thought Content: Thought content normal.        Judgment:  Judgment normal.     Back Exam   Tenderness  The patient is experiencing tenderness in the lumbar.  Range of Motion  Extension: normal  Flexion: normal  Lateral bend right: abnormal  Lateral bend left: abnormal  Rotation right: abnormal  Rotation left: abnormal   Muscle Strength  Right Quadriceps:  5/5  Left Quadriceps:  5/5  Right Hamstrings:  5/5  Left Hamstrings:  5/5   Reflexes  Patellar: 1/4 Achilles: 1/4 Biceps: 1/4 Babinski's sign: normal   Other  Toe walk: abnormal Heel walk: abnormal Sensation: normal Gait: normal  Erythema: no back redness Scars: absent      Specialty Comments:  No specialty comments available.  Imaging: No results found.   PMFS History: Patient Active Problem List   Diagnosis Date Noted  . Spinal stenosis, lumbar region with neurogenic  claudication 11/25/2019    Priority: High    Class: Chronic  . Spinal stenosis of lumbar region with neurogenic claudication 11/25/2019  . Cervical dystonia 07/09/2019  . Neck pain 07/09/2019  . OSA (obstructive sleep apnea) 10/31/2018  . Status post total replacement of right hip 09/07/2018  . Unilateral primary osteoarthritis, right hip 08/13/2018  . Severe obesity (BMI >= 40) ([facility]) 08/13/2018  . Chronic bilateral low back pain with bilateral sciatica 04/02/2018  . Epidural lipomatosis 04/02/2018  . Right hip pain 04/02/2018  . Preop cardiovascular exam 03/30/2018  . Dilated aortic root ([facility])   . Chest pain 07/27/2017  . Palpitations 07/26/2017  . ERECTILE DYSFUNCTION, MILD 11/12/2007  . Tobacco abuse 11/12/2007   Past Medical History:  Diagnosis Date  . Arthritis   . Back pain   . Dilated aortic root ([facility])    34mm ascending aorta by echo 09/2017, normal on echo 08/2018  . ERECTILE DYSFUNCTION, MILD 11/12/2007   Qualifier: Diagnosis of  By: Carolyne Littles    . Head injury with loss of consciousness ([facility]) 1997   unconsciousness  . Heart murmur    as a child  . History of blood transfusion    1997, received 15 units of blood   . Morbid obesity ([facility])   . Neck pain   . PVC's (premature ventricular contractions)   . TOBACCO ABUSE 11/12/2007   Qualifier: Diagnosis of  By: Carmie End MD, Junie Panning    . Tremor    head and hands , controlled on cymbalta    Family History  Problem Relation Age of Onset  . COPD Mother   . Leukemia Father   . Hypertension Sister   . Diabetes Sister     Past Surgical History:  Procedure Laterality Date  . FACIAL FRACTURE SURGERY  09/1995   left side of face crush injury, carnial fracture   . LUMBAR LAMINECTOMY/DECOMPRESSION MICRODISCECTOMY N/A 11/25/2019   Procedure: BILATERAL PARTIAL LUMBAR HEMILAMINECTOMIES LUMBAR FOUR-FIVE;  Surgeon: Jessy Oto, MD;  Location: [facility];  Service: Orthopedics;  Laterality: N/A;  . TOTAL HIP ARTHROPLASTY Right 09/07/2018    Procedure: RIGHT TOTAL HIP ARTHROPLASTY ANTERIOR APPROACH;  Surgeon: Mcarthur Rossetti, MD;  Location: [facility];  Service: Orthopedics;  Laterality: Right;   Social History   Occupational History  . Occupation: waiting on disability  Tobacco Use  . Smoking status: Current Every Day Smoker    Packs/day: 0.50    Years: 33.00    Pack years: 16.50  . Smokeless tobacco: Never Used  Substance and Sexual Activity  . Alcohol use: Yes    Alcohol/week: 1.0 standard drink    Types: 1  Cans of beer per week  . Drug use: No  . Sexual activity: Not Currently

## 2020-05-18 NOTE — Patient Instructions (Signed)
Avoid bending, stooping and avoid lifting weights greater than 10 lbs. Avoid prolong standing and walking. Avoid frequent bending and stooping  No lifting greater than 10 lbs. May use ice or moist heat for pain. Weight loss is of benefit. Handicap license is approved. Start lyrica 75 mg BID for nerve pain Start 6 day medrol dose pak. Return in 4 weeks  Consider PT for core strengthening. Weight loss program.

## 2020-05-21 ENCOUNTER — Ambulatory Visit: Payer: BLUE CROSS/BLUE SHIELD | Admitting: Dietician

## 2020-06-10 ENCOUNTER — Other Ambulatory Visit: Payer: BLUE CROSS/BLUE SHIELD

## 2020-08-10 ENCOUNTER — Other Ambulatory Visit (INDEPENDENT_AMBULATORY_CARE_PROVIDER_SITE_OTHER): Payer: Self-pay | Admitting: Primary Care

## 2020-08-10 DIAGNOSIS — M5442 Lumbago with sciatica, left side: Secondary | ICD-10-CM

## 2020-08-10 DIAGNOSIS — G8929 Other chronic pain: Secondary | ICD-10-CM

## 2020-08-10 DIAGNOSIS — Z760 Encounter for issue of repeat prescription: Secondary | ICD-10-CM

## 2020-08-10 MED FILL — ATORVASTATIN CALCIUM 40 MG: 40 | 90 days supply | Qty: 90 | Fill #0

## 2020-08-10 NOTE — Telephone Encounter (Signed)
Sent to PCP to refill if appropriate.  

## 2020-08-18 ENCOUNTER — Other Ambulatory Visit: Payer: Self-pay

## 2020-08-18 ENCOUNTER — Telehealth: Payer: BLUE CROSS/BLUE SHIELD | Admitting: Cardiology

## 2020-08-27 NOTE — Progress Notes (Signed)
Virtual Visit via Video Note   This visit type was conducted due to national recommendations for restrictions regarding the COVID-19 Pandemic (e.g. social distancing) in an effort to limit this patient's exposure and mitigate transmission in our community.  Due to his co-morbid illnesses, this patient is at least at moderate risk for complications without adequate follow up.  This format is felt to be most appropriate for this patient at this time.  The patient did not have access to video technology/had technical difficulties with video requiring transitioning to audio format only (telephone).  All issues noted in this document were discussed and addressed.  No physical exam could be performed with this format.  Please refer to the patient's chart for his  consent to telehealth for [facility].   Evaluation Performed:  Follow-up visit  This visit type was conducted due to national recommendations for restrictions regarding the COVID-19 Pandemic (e.g. social distancing).  This format is felt to be most appropriate for this patient at this time.  All issues noted in this document were discussed and addressed.  No physical exam was performed (except for noted visual exam findings with Video Visits).  Please refer to the patient's chart (MyChart message for video visits and phone note for telephone visits) for the patient's consent to telehealth for [facility].  Date:  04/02/2020   ID:  Blake [name], DOB 08/23/1970, MRN [id number]  Patient Location:  Home  Provider location:   [place]  PCP:  [name], NP  Cardiologist:  Fransico Him, MD    Referring MD: [name], NP   Chief Complaint  Patient presents with  . Coronary Artery Disease  . Hypertension  . Hyperlipidemia  . Sleep Apnea    History of Present Illness:   Blake [name] is a 50 y.o. male who presents via audio/video conferencing for a telehealth visit today.    PRUITT TABOADA is a 50 y.o. male  with a hx of morbid obesity, tobacco abuse, ED, tremor, PVCs, mildly dilated aorta.  Event monitor 08/10/17 showed NSR with occasional PVCs. ETT 08/10/17 was negative (mildly impaired exercise tolerance), rare PVCs noted with exercise. 2D Echo 08/2018 showed moderate LVH, EF 60-65% and normal aortic root.  When I last saw him he was having some atypical chest pain and Lexiscan myoview was normal.    He is here today for followup and is doing well. He denies any exertional chest pain. He denies any SOB, DOE, PND, orthopnea, LE edema, dizziness, or syncope. Occasaionally he will feel a skipped heart beat.  He is compliant with his meds and is tolerating meds with no SE.   He is doing well with his CPAP device and thinks that he has gotten used to it but stopped last month because he is having problems with his mask.  He tells me that he tolerates the mask but it is 39 months old and is leaking.  He feels like he needs a mask fitting. He says the mask comes off in his sleep or leaks.   He feels the pressure is adequate.  He says that he never feels very rested.    Past Medical History:  Diagnosis Date  . Arthritis   . Back pain   . Dilated aortic root (HCC)    71mm ascending aorta by echo 09/2017, normal on echo 08/2018  . ERECTILE DYSFUNCTION, MILD 11/12/2007   Qualifier: Diagnosis of  By: Carolyne Littles    . Head injury  with loss of consciousness ([place]) 1997   unconsciousness  . Heart murmur    as a child  . History of blood transfusion    1997, received 15 units of blood   . Morbid obesity ([place])   . Neck pain   . PVC's (premature ventricular contractions)   . TOBACCO ABUSE 11/12/2007   Qualifier: Diagnosis of  By: Carmie End MD, Junie Panning    . Tremor    head and hands , controlled on cymbalta    Past Surgical History:  Procedure Laterality Date  . FACIAL FRACTURE SURGERY  09/1995   left side of face crush injury, carnial fracture   . LUMBAR LAMINECTOMY/DECOMPRESSION MICRODISCECTOMY N/A 11/25/2019    Procedure: BILATERAL PARTIAL LUMBAR HEMILAMINECTOMIES LUMBAR FOUR-FIVE;  Surgeon: Jessy Oto, MD;  Location: [place];  Service: Orthopedics;  Laterality: N/A;  . TOTAL HIP ARTHROPLASTY Right 09/07/2018   Procedure: RIGHT TOTAL HIP ARTHROPLASTY ANTERIOR APPROACH;  Surgeon: Mcarthur Rossetti, MD;  Location: [facility];  Service: Orthopedics;  Laterality: Right;    Current Medications: Current Meds  Medication Sig  . aspirin EC 81 MG tablet Take 1 tablet (81 mg total) by mouth daily.  Marland Kitchen atorvastatin (LIPITOR) 40 MG tablet Take 1 tablet (40 mg total) by mouth daily.  Marland Kitchen gabapentin (NEURONTIN) 600 MG tablet Take 600 mg by mouth 3 (three) times daily.  Marland Kitchen oxyCODONE-acetaminophen (PERCOCET) 7.5-325 MG tablet Take 1 tablet by mouth 4 (four) times daily as needed.  . pregabalin (LYRICA) 75 MG capsule Take 1 capsule (75 mg total) by mouth 2 (two) times daily.  . propranolol ER (INDERAL LA) 60 MG 24 hr capsule Take 1 capsule (60 mg total) by mouth daily.  . sildenafil (VIAGRA) 100 MG tablet Take 0.5-1 tablets (50-100 mg total) by mouth daily as needed for erectile dysfunction.  . Vitamin D, Ergocalciferol, (DRISDOL) 1.25 MG (50000 UNIT) CAPS capsule Take 50,000 Units by mouth once a week.     Allergies:   Patient has no known allergies.   Social History   Socioeconomic History  . Marital status: Single    Spouse name: Not on file  . Number of children: 3  . Years of education: some college  . Highest education level: Not on file  Occupational History  . Occupation: waiting on disability  Tobacco Use  . Smoking status: Current Every Day Smoker    Packs/day: 0.50    Years: 33.00    Pack years: 16.50  . Smokeless tobacco: Never Used  Substance and Sexual Activity  . Alcohol use: Yes    Alcohol/week: 1.0 standard drink    Types: 1 Cans of beer per week  . Drug use: No  . Sexual activity: Not Currently  Other Topics Concern  . Not on file  Social History Narrative   Lives at home alone.    Right-handed.   No daily caffeine use.   Social Determinants of Health   Financial Resource Strain: Not on file  Food Insecurity: Not on file  Transportation Needs: Not on file  Physical Activity: Not on file  Stress: Not on file  Social Connections: Not on file     Family History: The patient's family history includes COPD in his mother; Diabetes in his sister; Hypertension in his sister; Leukemia in his father.  ROS:   Please see the history of present illness.    ROS  All other systems reviewed and negative.   EKGs/Labs/Other Studies Reviewed:    The following studies were reviewed  today:  2D echo 08/30/2018 IMPRESSIONS    1. The left ventricle has normal systolic function with an ejection  fraction of 60-65%. The cavity size was normal. There is moderately  increased left ventricular wall thickness. Left ventricular diastolic  parameters were normal.  2. The right ventricle has normal systolic function. The cavity was  normal. There is no increase in right ventricular wall thickness.  3. Right atrial size was mildly dilated.  4. The mitral valve is normal in structure.  5. The tricuspid valve is normal in structure.  6. The aortic valve is normal in structure.  7. The aortic root and ascending aorta are normal in size and structure.  8. The interatrial septum was not assessed.   Nuclear Stress Test 06/2019 Study Highlights   Nuclear stress EF: 53%.  The left ventricular ejection fraction is mildly decreased (45-54%).  There was no ST segment deviation noted during stress.  No T wave inversion was noted during stress.  Defect 1: There is a medium defect of moderate severity present in the basal inferior, mid inferior and apical inferior location.  Findings consistent with inferior ischemia. This pattern may also be seen in diaphragmatic attenuation.  This is an intermediate risk study.    Recent Labs: 04/27/2020: ALT 23; BUN 12; Creatinine,  Ser 1.05; Hemoglobin 16.3; Platelets 281; Potassium 4.8; Sodium 138   Recent Lipid Panel    Component Value Date/Time   CHOL 155 04/23/2020 0929   TRIG 96 04/23/2020 0929   HDL 46 04/23/2020 0929   CHOLHDL 3.4 04/23/2020 0929   CHOLHDL 3.1 Ratio 12/11/2007 2056   VLDL 22 12/11/2007 2056   LDLCALC 91 04/23/2020 0929    Physical Exam:    VS:  Ht 6\' 3"  (1.905 m)   Wt (!) 365 lb (165.6 kg)   BMI 45.62 kg/m     Wt Readings from Last 3 Encounters:  08/28/20 (!) 365 lb (165.6 kg)  05/18/20 (!) 348 lb (157.9 kg)  04/27/20 (!) 348 lb (157.9 kg)     ASSESSMENT:    1. PVC's (premature ventricular contractions)   2. Morbid obesity ([place])   3. Coronary artery disease involving native coronary artery of native heart without angina pectoris   4. OSA (obstructive sleep apnea)   5. Pure hypercholesterolemia    PLAN:    In order of problems listed above:  1.  PVCs -event monitor showed PVCs in 2019 -continue Propranolol ER 60mg  daily -2D echo showed normal heart function with EF 60-65% -he was supposed to have a 2 week ziopatch to assess PVC load but this was never done>>I will order  2.  Morbid obesity -he is unable to exercise at present due to severe back pain  3.  ASCAD -Carlton Adam was normal 06/2019 -coronary CTA 08/2019 showed a coronary Ca score of 219 with moderate atherosclerosis with 50-69% mid RCA, 25-49% oLAD and normal FFR -Lexiscan myoview 04/2020 showed no ischemia  -continue ASA 81mg  daily, Lipitor and BB  4.  OSA  -He has not been using his device for a month due to problems with the mask fitting -I will get him an appt with his DME to get a mask fitting  5.  HLD -LDL goal < 70 -LDL was 91 in Oct 2021 and Lipitor was increased to 40mg  daily but he never came back for repeat lipids -check FLP and ALT -continue Lipitor 40mg  daily  COVID-19 Education: The signs and symptoms of COVID-19 were discussed with the patient and  how to seek care for testing (follow up  with PCP or arrange E-visit).  The importance of social distancing was discussed today.  Patient Risk:   After full review of this patient's clinical status, I feel that they are at least moderate risk at this time.  Time:   Today, I have spent 20 minutes on telemedicine discussing medical problems including OSA, CP, PVCs, obesity and reviewing patient's chart including 2D echo, Lexiscan myoview, Coronary CTA, PAP compliance download.   Medication Adjustments/Labs and Tests Ordered: Current medicines are reviewed at length with the patient today.  Concerns regarding medicines are outlined above.  No orders of the defined types were placed in this encounter.  No orders of the defined types were placed in this encounter.   Signed, Fransico Him, MD  08/28/2020 2:07 PM    [place]

## 2020-08-28 ENCOUNTER — Encounter: Payer: Self-pay | Admitting: Radiology

## 2020-08-28 ENCOUNTER — Encounter: Payer: Self-pay | Admitting: Cardiology

## 2020-08-28 ENCOUNTER — Telehealth (INDEPENDENT_AMBULATORY_CARE_PROVIDER_SITE_OTHER): Payer: BLUE CROSS/BLUE SHIELD | Admitting: Cardiology

## 2020-08-28 ENCOUNTER — Other Ambulatory Visit: Payer: Self-pay

## 2020-08-28 VITALS — Ht 75.0 in | Wt 365.0 lb

## 2020-08-28 DIAGNOSIS — I493 Ventricular premature depolarization: Secondary | ICD-10-CM | POA: Diagnosis not present

## 2020-08-28 DIAGNOSIS — G4733 Obstructive sleep apnea (adult) (pediatric): Secondary | ICD-10-CM

## 2020-08-28 DIAGNOSIS — I2510 Atherosclerotic heart disease of native coronary artery without angina pectoris: Secondary | ICD-10-CM

## 2020-08-28 DIAGNOSIS — E7800 Pure hypercholesterolemia, unspecified: Secondary | ICD-10-CM

## 2020-08-28 NOTE — Addendum Note (Signed)
Addended by: Antonieta Iba on: 08/28/2020 02:41 PM   Modules accepted: Orders

## 2020-08-28 NOTE — Progress Notes (Signed)
Enrolled patient for a 30 day [employer] Event Monitor to be mailed to patients home  

## 2020-08-28 NOTE — Patient Instructions (Signed)
Medication Instructions:  Your physician recommends that you continue on your current medications as directed. Please refer to the Current Medication list given to you today.  *If you need a refill on your cardiac medications before your next appointment, please call your pharmacy*   Lab Work: Fasting lipids and ALT If you have labs (blood work) drawn today and your tests are completely normal, you will receive your results only by: Marland Kitchen MyChart Message (if you have MyChart) OR . A paper copy in the mail If you have any lab test that is abnormal or we need to change your treatment, we will call you to review the results.   Testing/Procedures: Your physician has recommended that you wear an event monitor. Event monitors are medical devices that record the heart's electrical activity. Doctors most often [place] these monitors to diagnose arrhythmias. Arrhythmias are problems with the speed or rhythm of the heartbeat. The monitor is a small, portable device. You can wear one while you do your normal daily activities. This is usually used to diagnose what is causing palpitations/syncope (passing out).  Follow-Up: At [facility], you and your health needs are our priority.  As part of our continuing mission to provide you with exceptional heart care, we have created designated Provider Care Teams.  These Care Teams include your primary Cardiologist (physician) and Advanced Practice Providers (APPs -  Physician Assistants and Nurse Practitioners) who all work together to provide you with the care you need, when you need it.  Your next appointment:   3 month(s)  The format for your next appointment:   In Person  Provider:   You may see Fransico Him, MD or one of the following Advanced Practice Providers on your designated Care Team:    Melina Copa, PA-C  Ermalinda Barrios, PA-C

## 2020-09-01 ENCOUNTER — Telehealth: Payer: Self-pay | Admitting: *Deleted

## 2020-09-01 ENCOUNTER — Other Ambulatory Visit: Payer: BLUE CROSS/BLUE SHIELD

## 2020-09-01 DIAGNOSIS — G4733 Obstructive sleep apnea (adult) (pediatric): Secondary | ICD-10-CM

## 2020-09-01 NOTE — Addendum Note (Signed)
Addended by: Freada Bergeron on: 09/01/2020 06:41 PM   Modules accepted: Orders

## 2020-09-01 NOTE — Telephone Encounter (Signed)
-----   Message from Antonieta Iba, RN sent at 08/28/2020  2:41 PM EST ----- Dr. Radford Pax would like this patient set up for an appointment with his DME for a mask fitting.  Thanks!

## 2020-09-02 NOTE — Telephone Encounter (Signed)
Mask fit ordered through [facility] via community message.

## 2020-09-09 ENCOUNTER — Encounter (INDEPENDENT_AMBULATORY_CARE_PROVIDER_SITE_OTHER): Payer: BLUE CROSS/BLUE SHIELD

## 2020-09-09 DIAGNOSIS — I493 Ventricular premature depolarization: Secondary | ICD-10-CM | POA: Diagnosis not present

## 2020-11-25 ENCOUNTER — Encounter: Payer: BLUE CROSS/BLUE SHIELD | Admitting: Cardiology

## 2020-11-25 NOTE — Progress Notes (Signed)
This encounter was created in error - please disregard.

## 2020-12-24 ENCOUNTER — Ambulatory Visit: Payer: BLUE CROSS/BLUE SHIELD | Admitting: Cardiology

## 2020-12-31 IMAGING — MR MR CERVICAL SPINE W/O CM
5 series · 28 of 48 positions shown · non-contrast
Comparison: Cervical spine CT 06/10/2015

CLINICAL DATA: Neck and bilateral shoulder pain. Numbness in the
legs. Symptoms for 1 year.

EXAM:
MRI CERVICAL SPINE WITHOUT CONTRAST
TECHNIQUE: Multiplanar, multisequence MR imaging of the cervical spine was
performed. No intravenous contrast was administered.

[Series 3: tir sag · sagittal · 3.0mm · 0.41mm/px · 7 of 13 slices shown]
[im 1/13]
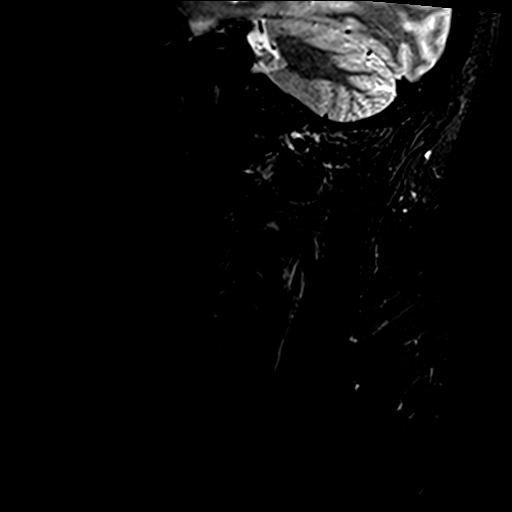
[im 3/13]
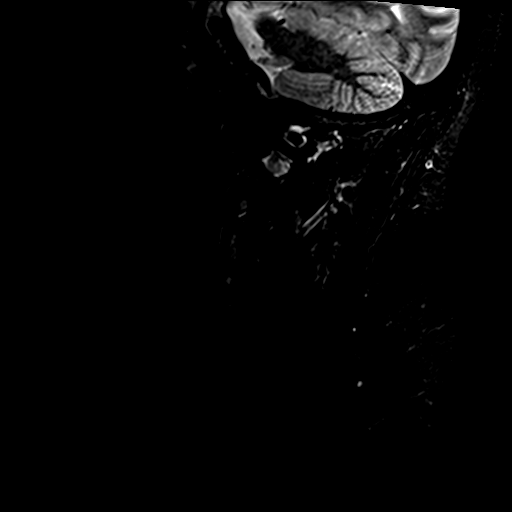
[im 5/13]
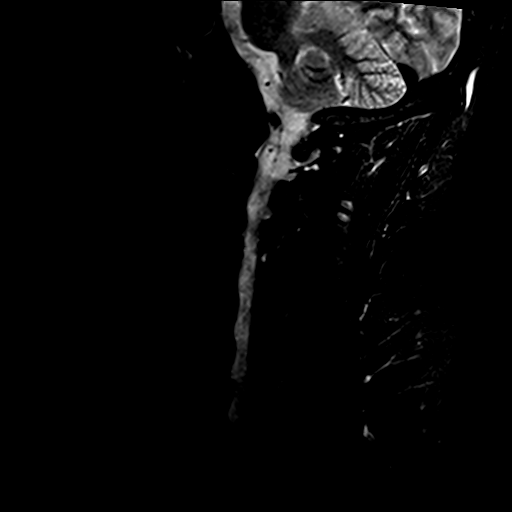
[im 7/13]
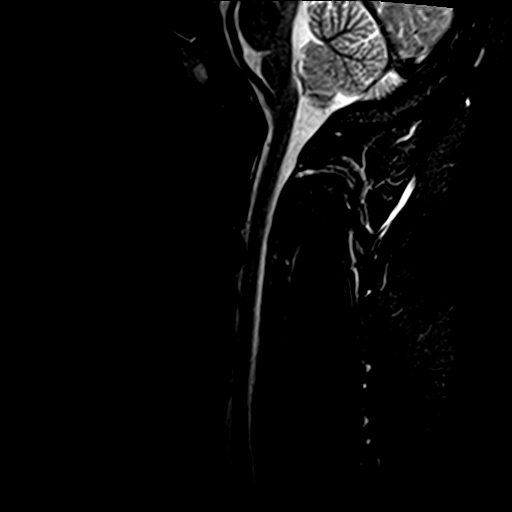
[im 9/13]
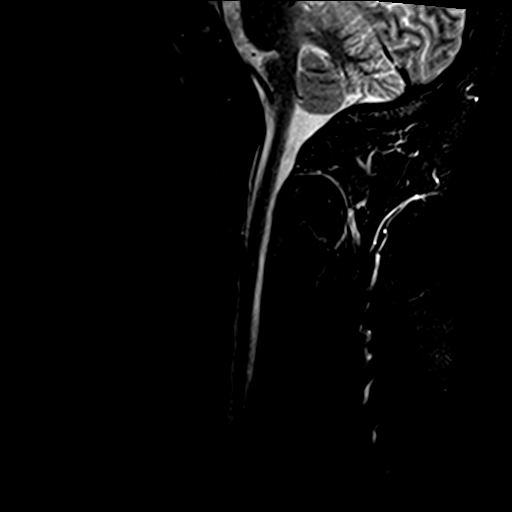
[im 11/13]
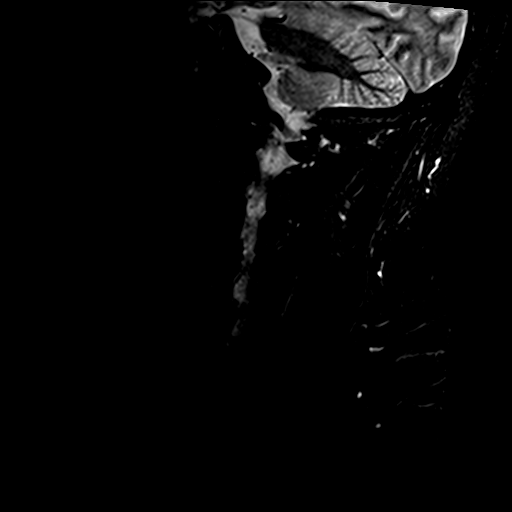
[im 13/13]
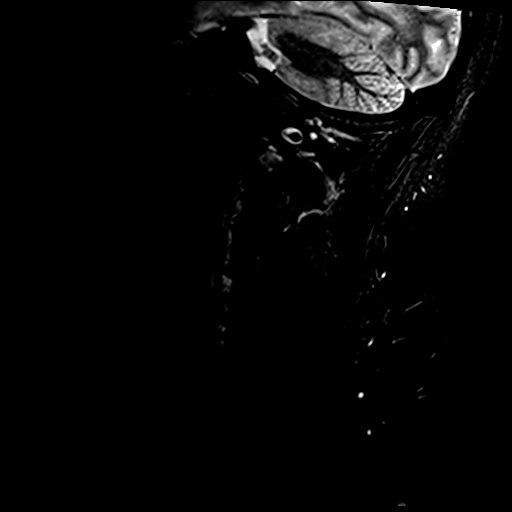

[Series 4: T1 · sagittal · 3.0mm · 0.41mm/px · 6 of 13 slices shown]
[im 1/13]
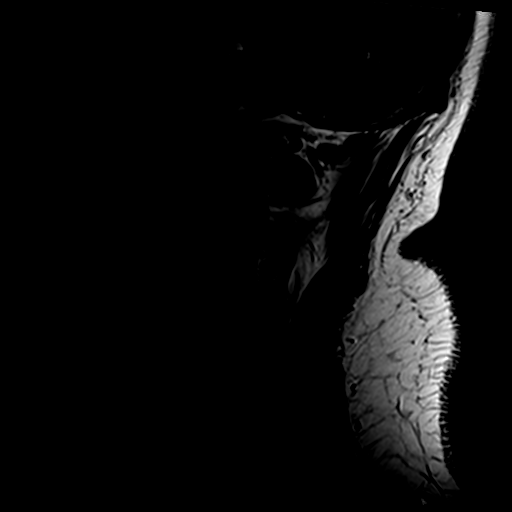
[im 3/13]
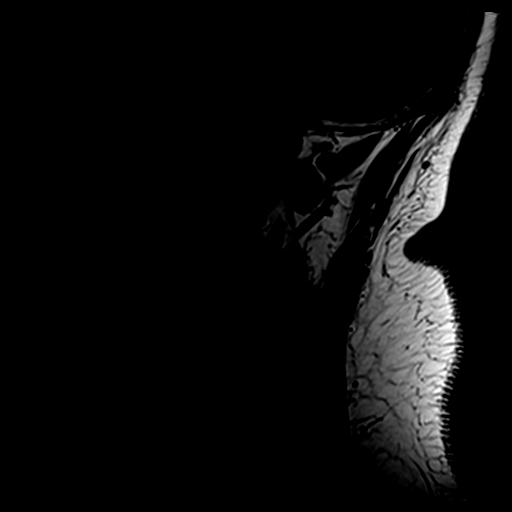
[im 5/13]
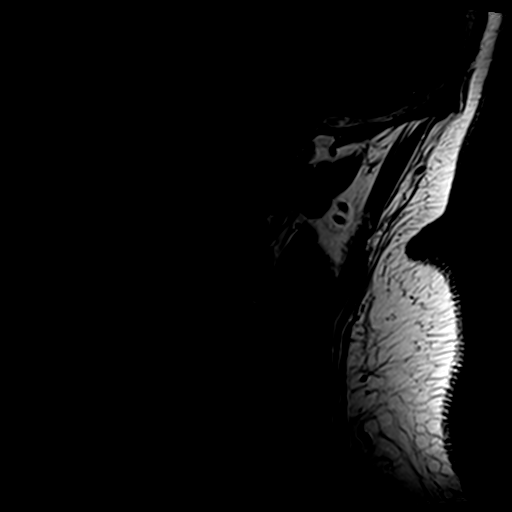
[im 8/13]
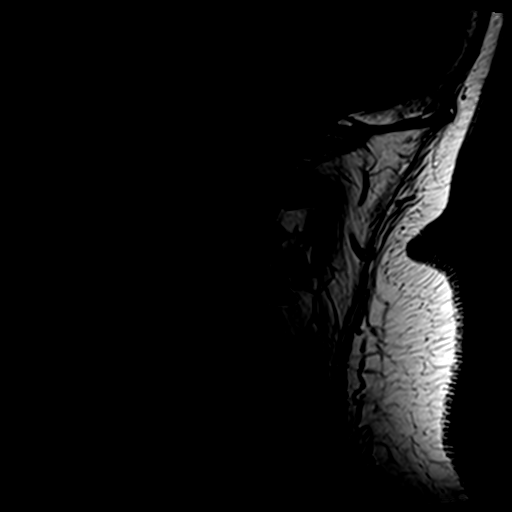
[im 10/13]
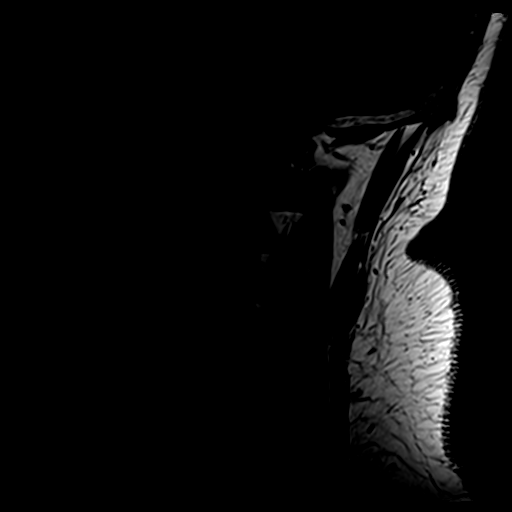
[im 13/13]
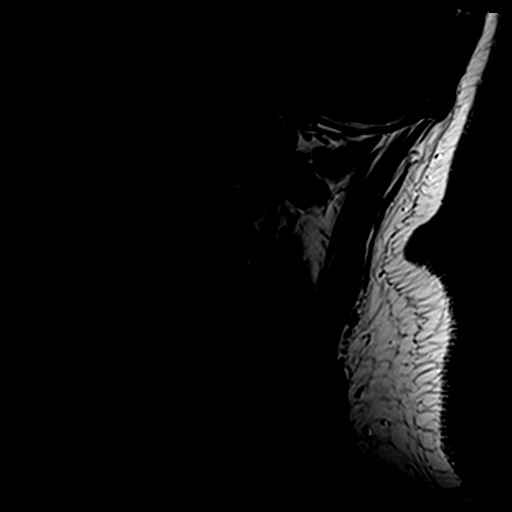

[Series 5: T2 · sagittal · 3.0mm · 0.66mm/px · 6 of 13 slices shown (1 of 2)]
[im 1/13]
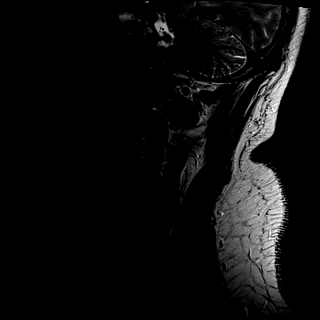
[im 3/13]
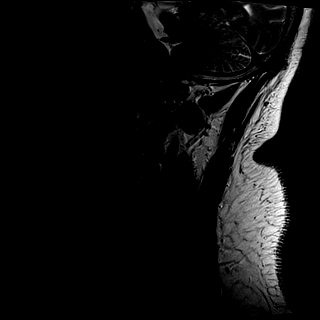
[im 5/13]
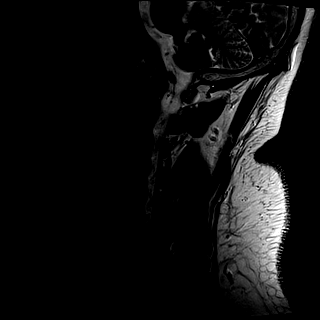
[im 8/13]
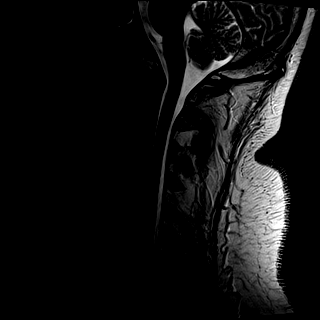
[im 10/13]
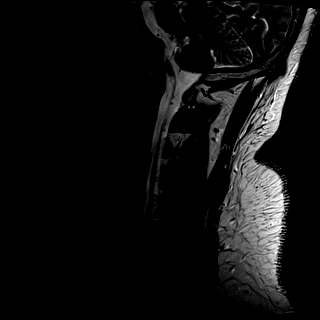
[im 13/13]
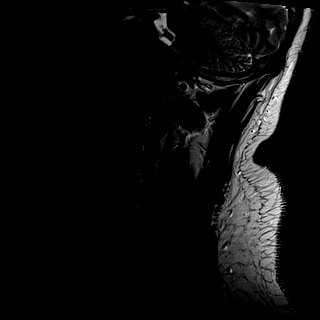

[Series 6: GRE · axial · 3.0mm · 0.35mm/px · 1 of 30 slices shown]
[im 1/30]
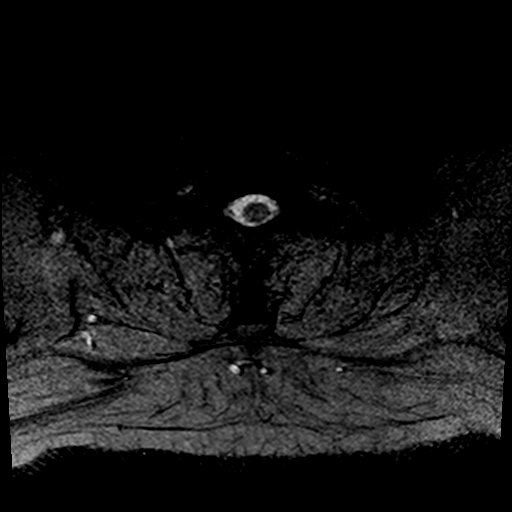

[Series 7: T2 · axial · 3.0mm · 0.70mm/px · z∈[-112,-3]mm · 8 of 29 slices shown (2 of 2)]
[im 1/29]
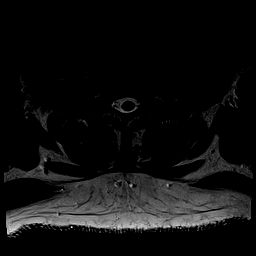
[im 5/29]
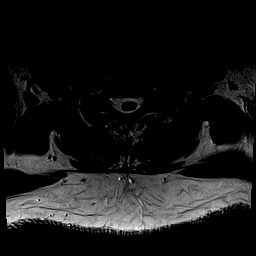
[im 9/29]
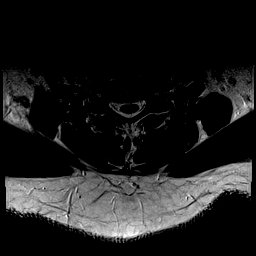
[im 13/29]
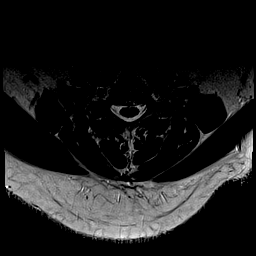
[im 16/29]
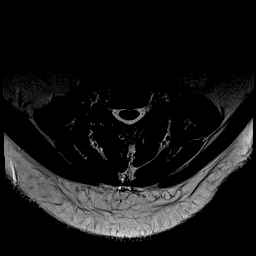
[im 20/29]
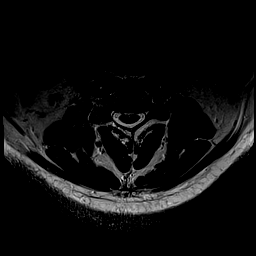
[im 24/29]
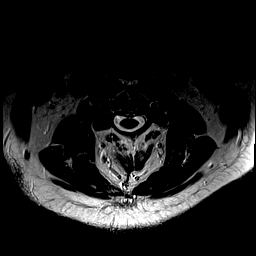
[im 29/29]
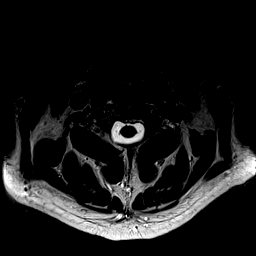

[28 of 48 positions shown; findings below may reference images not displayed]

FINDINGS: The study is mildly motion degraded.

Alignment: Cervical spine straightening. No listhesis.

Vertebrae: No fracture, suspicious marrow lesion, or significant
marrow edema. Unchanged small Schmorl's node involving the C7
superior endplate. Mild chronic degenerative endplate marrow changes
at C6-7 greater than C5-6.

Cord: Normal signal and morphology.

Posterior Fossa, vertebral arteries, paraspinal tissues:
Unremarkable.

Disc levels:

C2-3: Negative.

C3-4: Minimal disc bulging without significant stenosis.

C4-5: Minimal disc bulging without significant stenosis.

C5-6: Mild disc bulging without significant stenosis.

C6-7: Mild disc bulging without significant stenosis.

C7-T1: Minimal disc bulging without significant stenosis.
IMPRESSION: Mild cervical spondylosis without evidence of neural impingement.

## 2021-01-14 ENCOUNTER — Telehealth: Payer: Self-pay

## 2021-01-14 NOTE — Telephone Encounter (Signed)
Faxed note thru [employer]

## 2021-01-14 NOTE — Telephone Encounter (Signed)
Janett Billow from [employer] called she is requesting the most recent office visit notes she is trying to get the patient set up with personal care services [phone] call back:[phone]

## 2021-02-10 ENCOUNTER — Telehealth (INDEPENDENT_AMBULATORY_CARE_PROVIDER_SITE_OTHER): Payer: Self-pay

## 2021-02-10 NOTE — Telephone Encounter (Signed)
Copied from [place] [id number]. Topic: General - Inquiry >> Feb 09, 2021  1:47 PM Oneta Rack wrote: Osvaldo Human name: Altha Harm  Relation to pt: from [place]  Call back number: [phone] fax # [phone]    Reason for call:  Caller requesting office notes and sleep therapy notes supporting why patient would need a c pap. Will fax request to [phone] please note when received,  Patient last office visit was in October 2021. Last c pap visit was in 2020. Patient will need an office visit and a new order for a sleep study.

## 2021-03-11 ENCOUNTER — Telehealth (INDEPENDENT_AMBULATORY_CARE_PROVIDER_SITE_OTHER): Payer: Self-pay | Admitting: Primary Care

## 2021-03-11 NOTE — Telephone Encounter (Signed)
Copied from [place] [id number]. Topic: General - Call Back - No Documentation >> Mar 11, 2021 11:16 AM [name] wrote: Reason for CRM: [phone] Requesting a call back from [facility], says he is returning her phone call

## 2021-03-12 NOTE — Telephone Encounter (Signed)
Returned patients call. No answer. He has appt scheduled with PCP which is what call was originally for.

## 2021-03-25 ENCOUNTER — Ambulatory Visit (INDEPENDENT_AMBULATORY_CARE_PROVIDER_SITE_OTHER): Payer: BLUE CROSS/BLUE SHIELD | Admitting: Primary Care

## 2021-06-29 ENCOUNTER — Encounter (INDEPENDENT_AMBULATORY_CARE_PROVIDER_SITE_OTHER): Payer: Medicaid Other | Admitting: Primary Care

## 2021-08-25 ENCOUNTER — Telehealth (INDEPENDENT_AMBULATORY_CARE_PROVIDER_SITE_OTHER): Payer: Self-pay | Admitting: Primary Care

## 2021-08-25 NOTE — Telephone Encounter (Signed)
Please have [facility] pick the pt up for his appointment on Thurs, Feb 23 for his 10:10 appointment with Sharyn Lull.

## 2021-09-02 ENCOUNTER — Encounter (INDEPENDENT_AMBULATORY_CARE_PROVIDER_SITE_OTHER): Payer: Self-pay | Admitting: Primary Care

## 2021-09-02 ENCOUNTER — Ambulatory Visit (INDEPENDENT_AMBULATORY_CARE_PROVIDER_SITE_OTHER): Payer: Medicaid Other | Admitting: Primary Care

## 2021-09-02 ENCOUNTER — Other Ambulatory Visit: Payer: Self-pay

## 2021-09-02 VITALS — BP 124/87 | HR 85 | Temp 98.0°F | Ht 75.0 in | Wt 313.6 lb

## 2021-09-02 DIAGNOSIS — Z23 Encounter for immunization: Secondary | ICD-10-CM

## 2021-09-02 DIAGNOSIS — Z0000 Encounter for general adult medical examination without abnormal findings: Secondary | ICD-10-CM

## 2021-09-02 DIAGNOSIS — N529 Male erectile dysfunction, unspecified: Secondary | ICD-10-CM

## 2021-09-02 DIAGNOSIS — R351 Nocturia: Secondary | ICD-10-CM | POA: Diagnosis not present

## 2021-09-02 DIAGNOSIS — R7303 Prediabetes: Secondary | ICD-10-CM

## 2021-09-02 DIAGNOSIS — Z13228 Encounter for screening for other metabolic disorders: Secondary | ICD-10-CM

## 2021-09-02 DIAGNOSIS — Z6841 Body Mass Index (BMI) 40.0 and over, adult: Secondary | ICD-10-CM

## 2021-09-02 DIAGNOSIS — Z1211 Encounter for screening for malignant neoplasm of colon: Secondary | ICD-10-CM

## 2021-09-02 DIAGNOSIS — Z125 Encounter for screening for malignant neoplasm of prostate: Secondary | ICD-10-CM

## 2021-09-02 DIAGNOSIS — Z0001 Encounter for general adult medical examination with abnormal findings: Secondary | ICD-10-CM | POA: Diagnosis not present

## 2021-09-02 LAB — POCT GLYCOSYLATED HEMOGLOBIN (HGB A1C): Hemoglobin A1C: 5.5 % (ref 4.0–5.6)

## 2021-09-02 MED ORDER — SILDENAFIL CITRATE 100 MG PO TABS
50.0000 mg | ORAL_TABLET | Freq: Every day | ORAL | 3 refills | Status: DC | PRN
  Filled 2021-09-02: qty 10, 30d supply, fill #0
  Filled 2021-09-03: qty 10, 10d supply, fill #0
  Filled 2022-02-08: qty 10, 20d supply, fill #0
  Filled 2022-02-18: qty 10, 30d supply, fill #0
  Filled 2022-03-21 – 2022-06-30 (×3): qty 10, 30d supply, fill #1

## 2021-09-02 NOTE — Patient Instructions (Addendum)
Health Maintenance, Male °Adopting a healthy lifestyle and getting preventive care are important in promoting health and wellness. Ask your health care provider about: °The right schedule for you to have regular tests and exams. °Things you can do on your own to prevent diseases and keep yourself healthy. °What should I know about diet, weight, and exercise? °Eat a healthy diet ° °Eat a diet that includes plenty of vegetables, fruits, low-fat dairy products, and lean protein. °Do not eat a lot of foods that are high in solid fats, added sugars, or sodium. °Maintain a healthy weight °Body mass index (BMI) is a measurement that can be used to identify possible weight problems. It estimates body fat based on height and weight. Your health care provider can help determine your BMI and help you achieve or maintain a healthy weight. °Get regular exercise °Get regular exercise. This is one of the most important things you can do for your health. Most adults should: °Exercise for at least 150 minutes each week. The exercise should increase your heart rate and make you sweat (moderate-intensity exercise). °Do strengthening exercises at least twice a week. This is in addition to the moderate-intensity exercise. °Spend less time sitting. Even light physical activity can be beneficial. °Watch cholesterol and blood lipids °Have your blood tested for lipids and cholesterol at 51 years of age, then have this test every 5 years. °You may need to have your cholesterol levels checked more often if: °Your lipid or cholesterol levels are high. °You are older than 51 years of age. °You are at high risk for heart disease. °What should I know about cancer screening? °Many types of cancers can be detected early and may often be prevented. Depending on your health history and family history, you may need to have cancer screening at various ages. This may include screening for: °Colorectal cancer. °Prostate cancer. °Skin cancer. °Lung  cancer. °What should I know about heart disease, diabetes, and high blood pressure? °Blood pressure and heart disease °High blood pressure causes heart disease and increases the risk of stroke. This is more likely to develop in people who have high blood pressure readings or are overweight. °Talk with your health care provider about your target blood pressure readings. °Have your blood pressure checked: °Every 3-5 years if you are 18-39 years of age. °Every year if you are 40 years old or older. °If you are between the ages of 65 and 75 and are a current or former smoker, ask your health care provider if you should have a one-time screening for abdominal aortic aneurysm (AAA). °Diabetes °Have regular diabetes screenings. This checks your fasting blood sugar level. Have the screening done: °Once every three years after age 45 if you are at a normal weight and have a low risk for diabetes. °More often and at a younger age if you are overweight or have a high risk for diabetes. °What should I know about preventing infection? °Hepatitis B °If you have a higher risk for hepatitis B, you should be screened for this virus. Talk with your health care provider to find out if you are at risk for hepatitis B infection. °Hepatitis C °Blood testing is recommended for: °Everyone born from 1945 through 1965. °Anyone with known risk factors for hepatitis C. °Sexually transmitted infections (STIs) °You should be screened each year for STIs, including gonorrhea and chlamydia, if: °You are sexually active and are younger than 51 years of age. °You are older than 51 years of age and your   health care provider tells you that you are at risk for this type of infection. Your sexual activity has changed since you were last screened, and you are at increased risk for chlamydia or gonorrhea. Ask your health care provider if you are at risk. Ask your health care provider about whether you are at high risk for HIV. Your health care provider  may recommend a prescription medicine to help prevent HIV infection. If you choose to take medicine to prevent HIV, you should first get tested for HIV. You should then be tested every 3 months for as long as you are taking the medicine. Follow these instructions at home: Alcohol use Do not drink alcohol if your health care provider tells you not to drink. If you drink alcohol: Limit how much you have to 0-2 drinks a day. Know how much alcohol is in your drink. In the [place] one drink equals one 12 oz bottle of beer (355 mL), one 5 oz glass of wine (148 mL), or one 1 oz glass of hard liquor (44 mL). Lifestyle Do not use any products that contain nicotine or tobacco. These products include cigarettes, chewing tobacco, and vaping devices, such as e-cigarettes. If you need help quitting, ask your health care provider. Do not use street drugs. Do not share needles. Ask your health care provider for help if you need support or information about quitting drugs. General instructions Schedule regular health, dental, and eye exams. Stay current with your vaccines. Tell your health care provider if: You often feel depressed. You have ever been abused or do not feel safe at home. Summary Adopting a healthy lifestyle and getting preventive care are important in promoting health and wellness. Follow your health care provider's instructions about healthy diet, exercising, and getting tested or screened for diseases. Follow your health care provider's instructions on monitoring your cholesterol and blood pressure. This information is not intended to replace advice given to you by your health care provider. Make sure you discuss any questions you have with your health care provider. Document Revised: 11/16/2020 Document Reviewed: 11/16/2020 Elsevier Patient Education  2022 [place]. Influenza, Adult Influenza is also called "the flu." It is an infection in the lungs, nose, and throat (respiratory  tract). It spreads easily from person to person (is contagious). The flu causes symptoms that are like a cold, along with high fever and body aches. What are the causes? This condition is caused by the influenza virus. You can get the virus by: Breathing in droplets that are in the air after a person infected with the flu coughed or sneezed. Touching something that has the virus on it and then touching your mouth, nose, or eyes. What increases the risk? Certain things may make you more likely to get the flu. These include: Not washing your hands often. Having close contact with many people during cold and flu season. Touching your mouth, eyes, or nose without first washing your hands. Not getting a flu shot every year. You may have a higher risk for the flu, and serious problems, such as a lung infection (pneumonia), if you: Are older than 65. Are pregnant. Have a weakened disease-fighting system (immune system) because of a disease or because you are taking certain medicines. Have a long-term (chronic) condition, such as: Heart, kidney, or lung disease. Diabetes. Asthma. Have a liver disorder. Are very overweight (morbidly obese). Have anemia. What are the signs or symptoms? Symptoms usually begin suddenly and last 4-14 days. They may include: Fever  and chills. Headaches, body aches, or muscle aches. Sore throat. Cough. Runny or stuffy (congested) nose. Feeling discomfort in your chest. Not wanting to eat as much as normal. Feeling weak or tired. Feeling dizzy. Feeling sick to your stomach or throwing up. How is this treated? If the flu is found early, you can be treated with antiviral medicine. This can help to reduce how bad the illness is and how long it lasts. This may be given by mouth or through an IV tube. Taking care of yourself at home can help your symptoms get better. Your doctor may want you to: Take over-the-counter medicines. Drink plenty of fluids. The flu often  goes away on its own. If you have very bad symptoms or other problems, you may be treated in a hospital. Follow these instructions at home:   Activity Rest as needed. Get plenty of sleep. Stay home from work or school as told by your doctor. Do not leave home until you do not have a fever for 24 hours without taking medicine. Leave home only to go to your doctor. Eating and drinking Take an ORS (oral rehydration solution). This is a drink that is sold at pharmacies and stores. Drink enough fluid to keep your pee pale yellow. Drink clear fluids in small amounts as you are able. Clear fluids include: Water. Ice chips. Fruit juice mixed with water. Low-calorie sports drinks. Eat bland foods that are easy to digest. Eat small amounts as you are able. These foods include: Bananas. Applesauce. Rice. Lean meats. Toast. Crackers. Do not eat or drink: Fluids that have a lot of sugar or caffeine. Alcohol. Spicy or fatty foods. General instructions Take over-the-counter and prescription medicines only as told by your doctor. Use a cool mist humidifier to add moisture to the air in your home. This can make it easier for you to breathe. When using a cool mist humidifier, clean it daily. Empty water and replace with clean water. Cover your mouth and nose when you cough or sneeze. Wash your hands with soap and water often and for at least 20 seconds. This is also important after you cough or sneeze. If you cannot use soap and water, use alcohol-based hand sanitizer. Keep all follow-up visits. How is this prevented?  Get a flu shot every year. You may get the flu shot in late summer, fall, or winter. Ask your doctor when you should get your flu shot. Avoid contact with people who are sick during fall and winter. This is cold and flu season. Contact a doctor if: You get new symptoms. You have: Chest pain. Watery poop (diarrhea). A fever. Your cough gets worse. You start to have more  mucus. You feel sick to your stomach. You throw up. Get help right away if you: Have shortness of breath. Have trouble breathing. Have skin or nails that turn a bluish color. Have very bad pain or stiffness in your neck. Get a sudden headache. Get sudden pain in your face or ear. Cannot eat or drink without throwing up. These symptoms may represent a serious problem that is an emergency. Get medical help right away. Call your local emergency services (911 in the [place] Do not wait to see if the symptoms will go away. Do not drive yourself to the hospital. Summary Influenza is also called "the flu." It is an infection in the lungs, nose, and throat. It spreads easily from person to person. Take over-the-counter and prescription medicines only as told by your doctor. Getting  a flu shot every year is the best way to not get the flu. This information is not intended to replace advice given to you by your health care provider. Make sure you discuss any questions you have with your health care provider. Document Revised: 02/14/2020 Document Reviewed: 02/14/2020 Elsevier Patient Education  [place].

## 2021-09-02 NOTE — Progress Notes (Signed)
Subjective:    Blake [name] is a 51 y.o. male who presents for Medicare Annual/Subsequent preventive examination.   Preventive Screening-Counseling & Management  Tobacco Social History   Tobacco Use  Smoking Status Every Day   Packs/day: 0.50   Years: 33.00   Pack years: 16.50   Types: Cigarettes  Smokeless Tobacco Never    Problems Prior to Visit 1.   Current Problems (verified) Patient Active Problem List   Diagnosis Date Noted   Spinal stenosis, lumbar region with neurogenic claudication 11/25/2019    Class: Chronic   Spinal stenosis of lumbar region with neurogenic claudication 11/25/2019   Cervical dystonia 07/09/2019   Neck pain 07/09/2019   OSA (obstructive sleep apnea) 10/31/2018   Status post total replacement of right hip 09/07/2018   Unilateral primary osteoarthritis, right hip 08/13/2018   Severe obesity (BMI >= 40) (HCC) 08/13/2018   Chronic bilateral low back pain with bilateral sciatica 04/02/2018   Epidural lipomatosis 04/02/2018   Right hip pain 04/02/2018   Preop cardiovascular exam 03/30/2018   Dilated aortic root ([place])    Chest pain 07/27/2017   Palpitations 07/26/2017   ERECTILE DYSFUNCTION, MILD 11/12/2007   Tobacco abuse 11/12/2007    Medications Prior to Visit Current Outpatient Medications on File Prior to Visit  Medication Sig Dispense Refill   aspirin EC 81 MG tablet Take 1 tablet (81 mg total) by mouth daily. (Patient not taking: Reported on 09/02/2021) 90 tablet 3   atorvastatin (LIPITOR) 40 MG tablet TAKE 1 TABLET (40 MG TOTAL) BY MOUTH DAILY. 90 tablet 3   gabapentin (NEURONTIN) 600 MG tablet Take 600 mg by mouth 3 (three) times daily. (Patient not taking: Reported on 09/02/2021)     oxyCODONE-acetaminophen (PERCOCET) 7.5-325 MG tablet Take 1 tablet by mouth 4 (four) times daily as needed. (Patient not taking: Reported on 09/02/2021)     pregabalin (LYRICA) 75 MG capsule Take 1 capsule (75 mg total) by mouth 2 (two) times daily.  (Patient not taking: Reported on 09/02/2021) 60 capsule 2   propranolol ER (INDERAL LA) 60 MG 24 hr capsule Take 1 capsule (60 mg total) by mouth daily. (Patient not taking: Reported on 09/02/2021) 30 capsule 3   sildenafil (VIAGRA) 100 MG tablet Take 0.5-1 tablets (50-100 mg total) by mouth daily as needed for erectile dysfunction. (Patient not taking: Reported on 09/02/2021) 10 tablet 3   Vitamin D, Ergocalciferol, (DRISDOL) 1.25 MG (50000 UNIT) CAPS capsule Take 50,000 Units by mouth once a week. (Patient not taking: Reported on 09/02/2021)     No current facility-administered medications on file prior to visit.    Current Medications (verified) Current Outpatient Medications  Medication Sig Dispense Refill   aspirin EC 81 MG tablet Take 1 tablet (81 mg total) by mouth daily. (Patient not taking: Reported on 09/02/2021) 90 tablet 3   atorvastatin (LIPITOR) 40 MG tablet TAKE 1 TABLET (40 MG TOTAL) BY MOUTH DAILY. 90 tablet 3   gabapentin (NEURONTIN) 600 MG tablet Take 600 mg by mouth 3 (three) times daily. (Patient not taking: Reported on 09/02/2021)     oxyCODONE-acetaminophen (PERCOCET) 7.5-325 MG tablet Take 1 tablet by mouth 4 (four) times daily as needed. (Patient not taking: Reported on 09/02/2021)     pregabalin (LYRICA) 75 MG capsule Take 1 capsule (75 mg total) by mouth 2 (two) times daily. (Patient not taking: Reported on 09/02/2021) 60 capsule 2   propranolol ER (INDERAL LA) 60 MG 24 hr capsule Take 1 capsule (60 mg  total) by mouth daily. (Patient not taking: Reported on 09/02/2021) 30 capsule 3   sildenafil (VIAGRA) 100 MG tablet Take 0.5-1 tablets (50-100 mg total) by mouth daily as needed for erectile dysfunction. (Patient not taking: Reported on 09/02/2021) 10 tablet 3   Vitamin D, Ergocalciferol, (DRISDOL) 1.25 MG (50000 UNIT) CAPS capsule Take 50,000 Units by mouth once a week. (Patient not taking: Reported on 09/02/2021)     No current facility-administered medications for this visit.      Allergies (verified) Patient has no known allergies.   PAST HISTORY  Family History Family History  Problem Relation Age of Onset   COPD Mother    Leukemia Father    Hypertension Sister    Diabetes Sister     Social History Social History   Tobacco Use   Smoking status: Every Day    Packs/day: 0.50    Years: 33.00    Pack years: 16.50    Types: Cigarettes   Smokeless tobacco: Never  Substance Use Topics   Alcohol use: Yes    Alcohol/week: 1.0 standard drink    Types: 1 Cans of beer per week    Are there smokers in your home (other than you)?  Yes  Risk Factors Current exercise habits:  walking   Dietary issues discussed: yes   Cardiac risk factors: hypertension, male gender, obesity (BMI >= 30 kg/m2), and smoking/ tobacco exposure.  Depression Screen (Note: if answer to either of the following is "Yes", a more complete depression screening is indicated)   Q1: Over the past two weeks, have you felt down, depressed or hopeless? No  Q2: Over the past two weeks, have you felt little interest or pleasure in doing things? No  Have you lost interest or pleasure in daily life? No  Do you often feel hopeless? No  Do you cry easily over simple problems? No   Are you sexually active?  Yes  Do you have more than one partner?  Yes  Hearing Difficulties: No  Cognitive Testing  Alert? Yes  Normal Appearance?Yes  Oriented to person? Yes  Place? Yes   Time? Yes    Advanced Directives have been discussed with the patient? No   List the Names of Other Physician/Practitioners you currently use: 1.  Dr. Rush Farmer- orthopedist  2. Cardiologist -Dr. Fransico Him   Indicate any recent Medical Services you may have received from other than [facility] providers in the past year (date may be approximate).  Immunization History  Administered Date(s) Administered   Influenza,inj,Quad PF,6+ Mos 05/20/2016, 07/20/2017, 04/26/2018, 04/27/2020   PFIZER(Purple Top)SARS-COV-2  Vaccination 02/19/2020   Td 11/12/2007   Tdap 02/05/2018, 07/14/2019    Screening Tests Health Maintenance  Topic Date Due   Zoster Vaccines- Shingrix (1 of 2) Never done   COLONOSCOPY (Pts 45-67yrs Insurance coverage will need to be confirmed)  Never done   COVID-19 Vaccine (2 - Pfizer risk series) 03/11/2020   INFLUENZA VACCINE  02/08/2021   TETANUS/TDAP  07/13/2029   Hepatitis C Screening  Completed   HIV Screening  Completed   HPV VACCINES  Aged Out    All answers were reviewed with the patient and necessary referrals were made:  History reviewed: allergies, current medications, past family history, past medical history, past social history, and past surgical history  Review of Systems Pertinent items noted in HPI and remainder of comprehensive ROS otherwise negative.    Objective:     Vision by Snellen chart: right eye:20/50 , left eye:20/25 Blood  pressure 124/87, pulse 85, temperature 98 F (36.7 C), temperature source Oral, height 6\' 3"  (1.905 m), weight (!) 313 lb 9.6 oz (142.2 kg), SpO2 98 %. Body mass index is 39.2 kg/m.  BP 124/87 (BP Location: Right Arm, Patient Position: Sitting, Cuff Size: Large)    Pulse 85    Temp 98 F (36.7 C) (Oral)    Ht 6\' 3"  (1.905 m)    Wt (!) 313 lb 9.6 oz (142.2 kg)    SpO2 98%    BMI 39.20 kg/m  General appearance: alert, cooperative, and appears stated age Head: Normocephalic, without obvious abnormality, atraumatic Eyes: conjunctivae/corneas clear. PERRL, EOM's intact. Fundi benign. Ears: non visible cerumen impaction  Nose: Nares normal. Septum midline. Mucosa normal. No drainage or sinus tenderness. Neck: no adenopathy, no carotid bruit, no JVD, supple, symmetrical, trachea midline, and thyroid not enlarged, symmetric, no tenderness/mass/nodules Back: symmetric, no curvature. ROM normal. No CVA tenderness. Lungs: clear to auscultation bilaterally Heart: regular rate and rhythm, S1, S2 normal, no murmur, click, rub or  gallop Abdomen: soft, non-tender; bowel sounds normal; no masses,  no organomegaly Rectal: deferred Extremities: extremities normal, atraumatic, no cyanosis or edema     Assessment:  Blake [name] was seen today for annual exam.  Diagnoses and all orders for this visit:  Colon cancer screening -     Ambulatory referral to Gastroenterology  Routine general medical examination at a health care facility -     Comprehensive metabolic panel -     Lipid panel -     PSA(Must document that pt has been informed of limitations of PSA testing.)  Screening for metabolic disorder D9R 5.8- prediabetes - Monitor foods that are high in carbohydrates are the following rice, potatoes, breads, sugars, and pastas.  Reduction in the intake (eating) will assist in lowering your blood sugars.   Prostate cancer screening Prostate Specific Ag, Serum  Morbidly obese (HCC) Morbid Obesity is > 40 indicating an excess in caloric intake or underlining conditions. This may lead to other co-morbidities. Lifestyle modifications of diet and exercise may reduce obesity.      [name]

## 2021-09-03 ENCOUNTER — Other Ambulatory Visit: Payer: Self-pay

## 2021-09-03 ENCOUNTER — Other Ambulatory Visit: Payer: Self-pay | Admitting: Cardiology

## 2021-09-03 LAB — COMPREHENSIVE METABOLIC PANEL
ALT: 22 IU/L (ref 0–44)
AST: 20 IU/L (ref 0–40)
Albumin/Globulin Ratio: 1.6 (ref 1.2–2.2)
Albumin: 4.4 g/dL (ref 4.0–5.0)
Alkaline Phosphatase: 88 IU/L (ref 44–121)
BUN/Creatinine Ratio: 16 (ref 9–20)
BUN: 14 mg/dL (ref 6–24)
Bilirubin Total: 0.3 mg/dL (ref 0.0–1.2)
CO2: 20 mmol/L (ref 20–29)
Calcium: 9.2 mg/dL (ref 8.7–10.2)
Chloride: 105 mmol/L (ref 96–106)
Creatinine, Ser: 0.87 mg/dL (ref 0.76–1.27)
Globulin, Total: 2.8 g/dL (ref 1.5–4.5)
Glucose: 85 mg/dL (ref 70–99)
Potassium: 4.2 mmol/L (ref 3.5–5.2)
Sodium: 140 mmol/L (ref 134–144)
Total Protein: 7.2 g/dL (ref 6.0–8.5)
eGFR: 105 mL/min/{1.73_m2} (ref >59)

## 2021-09-03 LAB — LIPID PANEL
Chol/HDL Ratio: 3 ratio (ref 0.0–5.0)
Cholesterol, Total: 177 mg/dL (ref 100–199)
HDL: 60 mg/dL (ref >39)
LDL Chol Calc (NIH): 95 mg/dL (ref 0–99)
Triglycerides: 127 mg/dL (ref 0–149)
VLDL Cholesterol Cal: 22 mg/dL (ref 5–40)

## 2021-09-03 LAB — PSA: Prostate Specific Ag, Serum: 0.3 ng/mL (ref 0.0–4.0)

## 2021-09-03 MED ORDER — ATORVASTATIN CALCIUM 40 MG PO TABS
40.0000 mg | ORAL_TABLET | Freq: Every day | ORAL | 0 refills | Status: DC
  Filled 2021-09-03: qty 30, 30d supply, fill #0
  Filled 2022-02-08 – 2022-05-04 (×5): qty 90, 90d supply, fill #0

## 2021-09-10 ENCOUNTER — Other Ambulatory Visit: Payer: Self-pay

## 2021-09-16 ENCOUNTER — Other Ambulatory Visit: Payer: Self-pay

## 2021-09-16 ENCOUNTER — Encounter (INDEPENDENT_AMBULATORY_CARE_PROVIDER_SITE_OTHER): Payer: Self-pay | Admitting: Primary Care

## 2021-09-16 ENCOUNTER — Ambulatory Visit (INDEPENDENT_AMBULATORY_CARE_PROVIDER_SITE_OTHER): Payer: Medicaid Other | Admitting: Primary Care

## 2021-09-16 VITALS — BP 139/76 | HR 83 | Temp 98.2°F | Ht 75.0 in | Wt 314.0 lb

## 2021-09-16 DIAGNOSIS — H6123 Impacted cerumen, bilateral: Secondary | ICD-10-CM | POA: Diagnosis not present

## 2021-09-16 DIAGNOSIS — B372 Candidiasis of skin and nail: Secondary | ICD-10-CM | POA: Diagnosis not present

## 2021-09-16 DIAGNOSIS — B379 Candidiasis, unspecified: Secondary | ICD-10-CM

## 2021-09-16 MED ORDER — NYSTATIN 100000 UNIT/GM EX CREA
1.0000 "application " | TOPICAL_CREAM | Freq: Two times a day (BID) | CUTANEOUS | 1 refills | Status: AC
  Filled 2021-09-16: qty 90, 34d supply, fill #0
  Filled 2022-02-08 – 2022-03-21 (×3): qty 60, 30d supply, fill #0

## 2021-09-16 NOTE — Progress Notes (Signed)
?                                                 [place] ? ?Subjective:  ? ? Blake [name] is a 51 y.o. male  diminished hearing in both ears for the past 3 weeks. There is a prior history of cerumen impaction. The patient has not been using ear drops to loosen wax immediately prior to this visit. The patient denies ear pain. ? ?The patient's history has been marked as reviewed and updated as appropriate. ?allergies, current medications, past family history, past medical history, past social history, and past surgical history ?Past Medical History:  ?Diagnosis Date  ? Arthritis   ? Back pain   ? Dilated aortic root ([place])   ? 59m ascending aorta by echo 09/2017, normal on echo 08/2018  ? ERECTILE DYSFUNCTION, MILD 11/12/2007  ? Qualifier: Diagnosis of  By: GCarolyne Littles   ? [place] with loss of consciousness ([facility] [place]  ? unconsciousness  ? Heart murmur   ? as a child  ? History of blood transfusion   ? 1997, received 15 units of blood   ? Morbid obesity ([facility]   ? Neck pain   ? PVC's (premature ventricular contractions)   ? TOBACCO ABUSE 11/12/2007  ? Qualifier: Diagnosis of  By: ECarmie EndMD, [place]   ? Tremor   ? head and hands , controlled on cymbalta  ? ?Patient Active Problem List  ? Diagnosis Date Noted  ? Spinal stenosis, lumbar region with neurogenic claudication 11/25/2019  ?  Class: Chronic  ? Spinal stenosis of lumbar region with neurogenic claudication 11/25/2019  ? Cervical dystonia 07/09/2019  ? Neck pain 07/09/2019  ? OSA (obstructive sleep apnea) 10/31/2018  ? Status post total replacement of right hip 09/07/2018  ? Unilateral primary osteoarthritis, right hip 08/13/2018  ? Severe obesity (BMI >= 40) ([facility] 08/13/2018  ? Chronic bilateral low back pain with bilateral sciatica 04/02/2018  ? Epidural lipomatosis 04/02/2018  ? Right hip pain 04/02/2018  ? Preop  cardiovascular exam 03/30/2018  ? Dilated aortic root ([facility]   ? Chest pain 07/27/2017  ? Palpitations 07/26/2017  ? ERECTILE DYSFUNCTION, MILD 11/12/2007  ? Tobacco abuse 11/12/2007  ? ?Past Surgical History:  ?Procedure Laterality Date  ? FACIAL FRACTURE SURGERY  09/1995  ? left side of face crush injury, carnial fracture   ? LUMBAR LAMINECTOMY/DECOMPRESSION MICRODISCECTOMY N/A 11/25/2019  ? Procedure: BILATERAL PARTIAL LUMBAR HEMILAMINECTOMIES LUMBAR FOUR-FIVE;  Surgeon: NJessy Oto MD;  [facility]  Service: Orthopedics;  Laterality: N/A;  ? TOTAL HIP ARTHROPLASTY Right 09/07/2018  ? Procedure: RIGHT TOTAL HIP ARTHROPLASTY ANTERIOR APPROACH;  Surgeon: BMcarthur Rossetti MD;  [facility];  Service: Orthopedics;  Laterality: Right;  ? ?Family History  ?Problem Relation Age of Onset  ? COPD Mother   ? Leukemia Father   ? Hypertension Sister   ? Diabetes Sister   ? ?Social History  ? ?Socioeconomic History  ? Marital status: Single  ?  Spouse name: Not on file  ? Number of children: 3  ? Years of education: some college  ? Highest education level: Not on file  ?Occupational History  ? Occupation: waiting on disability  ?Tobacco Use  ? Smoking status: Every Day  ?  Packs/day: 0.50  ?  Years: 33.00  ?  Pack years: 16.50  ?  Types: Cigarettes  ? Smokeless tobacco: Never  ?Substance and Sexual Activity  ? Alcohol use: Yes  ?  Alcohol/week: 1.0 standard drink  ?  Types: 1 Cans of beer per week  ? Drug use: No  ? Sexual activity: Not Currently  ?Other Topics Concern  ? Not on file  ?Social History Narrative  ? Lives at home alone.  ? Right-handed.  ? No daily caffeine use.  ? ?Social Determinants of Health  ? ?Financial Resource Strain: Not on file  ?Food Insecurity: Not on file  ?Transportation Needs: Not on file  ?Physical Activity: Not on file  ?Stress: Not on file  ?Social Connections: Not on file  ? ?Current Outpatient Medications  ?Medication Sig Dispense Refill  ? nystatin cream (MYCOSTATIN) Apply  1 application. topically 2 (two) times daily. 90 g 1  ? aspirin EC 81 MG tablet Take 1 tablet (81 mg total) by mouth daily. (Patient not taking: Reported on 09/02/2021) 90 tablet 3  ? atorvastatin (LIPITOR) 40 MG tablet Take 1 tablet (40 mg total) by mouth daily. Please schedule appt for future refills. 1st attempt 90 tablet 0  ? gabapentin (NEURONTIN) 600 MG tablet Take 600 mg by mouth 3 (three) times daily. (Patient not taking: Reported on 09/02/2021)    ? oxyCODONE-acetaminophen (PERCOCET) 7.5-325 MG tablet Take 1 tablet by mouth 4 (four) times daily as needed. (Patient not taking: Reported on 09/02/2021)    ? pregabalin (LYRICA) 75 MG capsule Take 1 capsule (75 mg total) by mouth 2 (two) times daily. (Patient not taking: Reported on 09/02/2021) 60 capsule 2  ? propranolol ER (INDERAL LA) 60 MG 24 hr capsule Take 1 capsule (60 mg total) by mouth daily. (Patient not taking: Reported on 09/02/2021) 30 capsule 3  ? sildenafil (VIAGRA) 100 MG tablet Take 0.5-1 tablets (50-100 mg total) by mouth daily as needed for erectile dysfunction. 10 tablet 3  ? Vitamin D, Ergocalciferol, (DRISDOL) 1.25 MG (50000 UNIT) CAPS capsule Take 50,000 Units by mouth once a week. (Patient not taking: Reported on 09/02/2021)    ? ?No current facility-administered medications for this visit.  ? ?Allergies: Patient has no known allergies. ? ?Review of Systems ?Pertinent items noted in HPI and remainder of comprehensive ROS otherwise negative.  ?  ?Objective:  ?BP 139/76 (BP Location: Right Arm, Patient Position: Sitting, Cuff Size: Large)   Pulse 83   Temp 98.2 ?F (36.8 ?C) (Oral)   Ht '6\' 3"'$  (1.905 m)   Wt (!) 314 lb (142.4 kg)   SpO2 98%   BMI 39.25 kg/m?   ?Physical exam: ?General: Vital signs reviewed.  Patient is well-developed and well-nourished, obese male in no acute distress and cooperative with exam. ?Head: ears bilateral cerumen impaction  ?Eyes: EOMI, conjunctivae normal, no scleral icterus. ?Cardiovascular: RRR, S1 normal, S2  normal, no murmurs, gallops, or rubs. ?Pulmonary/Chest: Clear to auscultation bilaterally, no wheezes, rales, or rhonchi. ?Abdominal: Soft, non-tender, non-distended, BS +, no masses, organomegaly, or guarding present. ?Neurological: A&O x3, Strength is normal ?Skin:  rashes neck and groin area  ?Psychiatric: Normal mood and affect. speech and behavior is normal. Cognition and memory are normal. ?  ?Lord was seen today for cerumen impaction. ? ?Diagnoses and all orders for this visit: ? ?Candida infection ?-     nystatin cream (MYCOSTATIN); Apply 1 application. topically 2 (two) times daily. ? ?Bilateral impacted cerumen ? Auditory canal(s) of both ears are completely obstructed with cerumen.  ?Cerumen was  removed using gentle irrigation. Tympanic membranes are intact following the procedure.  Auditory canals are inflamed.  ? Cerumen Impaction without otitis externa.  ?  1. Care instructions given. ?2. Home treatment: none. ?3. Follow-up as needed.  ? ? ?This note has been created with Surveyor, quantity. Any transcriptional errors are unintentional.  ? ?[name], NP ?09/16/2021, 3:19 PM  ?

## 2021-09-23 ENCOUNTER — Other Ambulatory Visit: Payer: Self-pay

## 2021-10-25 ENCOUNTER — Telehealth (INDEPENDENT_AMBULATORY_CARE_PROVIDER_SITE_OTHER): Payer: Self-pay

## 2021-10-25 NOTE — Telephone Encounter (Signed)
Copied from [place] [id number]. Topic: Referral - Request for Referral ?>> Oct 25, 2021 10:08 AM Reyne Dumas L wrote: ?Has patient seen PCP for this complaint? Yes.   ?*If NO, is insurance requiring patient see PCP for this issue before PCP can refer them? ?Referral for which specialty: Pain management with  ?Preferred provider/office: no preference; [facility] location [address] ?Reason for referral: back and hip pain. ?

## 2021-10-26 ENCOUNTER — Other Ambulatory Visit (INDEPENDENT_AMBULATORY_CARE_PROVIDER_SITE_OTHER): Payer: Self-pay | Admitting: Primary Care

## 2021-10-26 DIAGNOSIS — G8929 Other chronic pain: Secondary | ICD-10-CM

## 2021-10-29 ENCOUNTER — Encounter: Payer: Self-pay | Admitting: Physical Medicine & Rehabilitation

## 2021-12-07 ENCOUNTER — Encounter: Payer: Medicaid Other | Attending: Physical Medicine & Rehabilitation | Admitting: Physical Medicine & Rehabilitation

## 2022-02-08 ENCOUNTER — Other Ambulatory Visit (INDEPENDENT_AMBULATORY_CARE_PROVIDER_SITE_OTHER): Payer: Self-pay | Admitting: Primary Care

## 2022-02-08 ENCOUNTER — Other Ambulatory Visit: Payer: Self-pay

## 2022-02-14 ENCOUNTER — Other Ambulatory Visit: Payer: Self-pay

## 2022-02-15 ENCOUNTER — Ambulatory Visit: Payer: Medicaid Other | Admitting: Physician Assistant

## 2022-02-18 ENCOUNTER — Other Ambulatory Visit: Payer: Self-pay

## 2022-03-01 ENCOUNTER — Encounter (INDEPENDENT_AMBULATORY_CARE_PROVIDER_SITE_OTHER): Payer: Self-pay | Admitting: Primary Care

## 2022-03-01 ENCOUNTER — Ambulatory Visit (INDEPENDENT_AMBULATORY_CARE_PROVIDER_SITE_OTHER): Payer: Medicaid Other | Admitting: Primary Care

## 2022-03-01 ENCOUNTER — Encounter: Payer: Self-pay | Admitting: Physical Medicine & Rehabilitation

## 2022-03-01 VITALS — BP 112/78 | HR 77 | Temp 97.9°F | Ht 75.0 in | Wt 307.6 lb

## 2022-03-01 DIAGNOSIS — Z1211 Encounter for screening for malignant neoplasm of colon: Secondary | ICD-10-CM | POA: Diagnosis not present

## 2022-03-01 DIAGNOSIS — G4733 Obstructive sleep apnea (adult) (pediatric): Secondary | ICD-10-CM | POA: Diagnosis not present

## 2022-03-01 DIAGNOSIS — M48062 Spinal stenosis, lumbar region with neurogenic claudication: Secondary | ICD-10-CM | POA: Diagnosis not present

## 2022-03-01 NOTE — Progress Notes (Unsigned)
Blake [name], is a 51 y.o. male  YNW:[id number]  MVH:[id number]  DOB - [birth year]  Chief Complaint  Patient presents with   Referral    For sleep study        Subjective:   Mr.  [name] is a 51 y.o. male here today for a follow up visit. Patient has No chest pain, No abdominal pain - No Nausea, No new weakness tingling or numbness, No Cough - shortness of breath. Patient presents with obstructive sleep apnea. Patient has a 3 year history of symptoms of daytime fatigue, morning fatigue, tonsil enlargement, and morning headache.  Patient was previously diagnosed with OSA with apnea.  Unfortunately it was during the time of COVID and things were not followed through due to limiting the amount of patient is being seen and universal precautions.  He is requesting to have it done right and be sent for a sleep study and treatment after completed. No Known Allergies  Past Medical History:  Diagnosis Date   Arthritis    Back pain    Dilated aortic root (HCC)    27m ascending aorta by echo 09/2017, normal on echo 08/2018   ERECTILE DYSFUNCTION, MILD 11/12/2007   Qualifier: Diagnosis of  By: GCarolyne Littles    Head injury with loss of consciousness ([facility] 1997   unconsciousness   Heart murmur    as a child   History of blood transfusion    1997, received 15 units of blood    Morbid obesity ([facility]    Neck pain    PVC's (premature ventricular contractions)    TOBACCO ABUSE 11/12/2007   Qualifier: Diagnosis of  By: ECarmie EndMD, Erin     Tremor    head and hands , controlled on cymbalta    Current Outpatient Medications on File Prior to Visit  Medication Sig Dispense Refill   sildenafil (VIAGRA) 100 MG tablet Take 0.5-1 tablets (50-100 mg total) by mouth daily as needed for erectile dysfunction. 10 tablet 3   aspirin EC 81 MG tablet Take 1 tablet (81 mg total) by mouth daily. (Patient not taking: Reported on 09/02/2021) 90 tablet 3   atorvastatin (LIPITOR) 40 MG tablet  Take 1 tablet (40 mg total) by mouth daily. Please schedule appt for future refills. 1st attempt (Patient not taking: Reported on 03/01/2022) 90 tablet 0   gabapentin (NEURONTIN) 600 MG tablet Take 600 mg by mouth 3 (three) times daily. (Patient not taking: Reported on 09/02/2021)     HYDROcodone-acetaminophen (NORCO/VICODIN) 5-325 MG tablet Take 1 tablet by mouth 4 (four) times daily.     nystatin cream (MYCOSTATIN) Apply topically 2 (two) times daily. 90 g 1   pregabalin (LYRICA) 75 MG capsule Take 1 capsule (75 mg total) by mouth 2 (two) times daily. (Patient not taking: Reported on 09/02/2021) 60 capsule 2   propranolol ER (INDERAL LA) 60 MG 24 hr capsule Take 1 capsule (60 mg total) by mouth daily. (Patient not taking: Reported on 09/02/2021) 30 capsule 3   Vitamin D, Ergocalciferol, (DRISDOL) 1.25 MG (50000 UNIT) CAPS capsule Take 50,000 Units by mouth once a week. (Patient not taking: Reported on 09/02/2021)     No current facility-administered medications on file prior to visit.    Objective:   Vitals:   03/01/22 1016  BP: 112/78  Pulse: 77  Temp: 97.9 F (36.6 C)  TempSrc: Oral  SpO2: 96%  Weight: (!) 307 lb 9.6 oz (139.5 kg)  Height: '6\' 3"'$  (1.905  m)    Exam General appearance : Awake, alert, not in any distress. Speech Clear. Not toxic looking HEENT: Atraumatic and Normocephalic, pupils equally reactive to light and accomodation Neck: Supple, no JVD. No cervical lymphadenopathy.  Chest: Good air entry bilaterally, no added sounds  CVS: S1 S2 regular, no murmurs.  Abdomen: Bowel sounds present, Non tender and not distended with no gaurding, rigidity or rebound. Extremities: B/L Lower Ext shows no edema, both legs are warm to touch Neurology: Awake alert, and oriented X 3, Non focal Skin: No Rash  Data Review Lab Results  Component Value Date   HGBA1C 5.5 09/02/2021   HGBA1C 5.8 (H) 11/21/2019   HGBA1C 5.6 09/13/2019    Assessment & Plan  Blake [name] was seen today for  referral.  Diagnoses and all orders for this visit:  OSA (obstructive sleep apnea) Patient presents with obstructive sleep apnea.  Patient generally gets 3 or 4 hours of sleep per night, and states they generally have difficulty falling asleep, difficulty falling back asleep if awakened, and generally restful sleep. Snoring of severe severity is present. Apneic episodes is present. Nasal obstruction is present.  Patient has had tonsillectomy.       Ambulatory referral to Pulmonology  Colon cancer screening -     Ambulatory referral to Gastroenterology       Patient have been counseled extensively about nutrition and exercise. Other issues discussed during this visit include: low cholesterol diet, weight control and daily exercise, foot care, annual eye examinations at Ophthalmology, importance of adherence with medications and regular follow-up. We also discussed long term complications of uncontrolled diabetes and hypertension.   No follow-ups on file.  The patient was given clear instructions to go to ER or return to medical center if symptoms don't improve, worsen or new problems develop. The patient verbalized understanding. The patient was told to call to get lab results if they haven't heard anything in the next week.   This note has been created with Surveyor, quantity. Any transcriptional errors are unintentional.   [name], NP 03/01/2022, 10:24 AM

## 2022-03-01 NOTE — Patient Instructions (Addendum)
Phone:  [phone]   pain clinic  Sent Referral to [facility] Ph# [phone] #[phone] [place]

## 2022-03-03 ENCOUNTER — Ambulatory Visit: Payer: Self-pay

## 2022-03-03 ENCOUNTER — Ambulatory Visit (INDEPENDENT_AMBULATORY_CARE_PROVIDER_SITE_OTHER): Payer: Medicaid Other | Admitting: Specialist

## 2022-03-03 ENCOUNTER — Other Ambulatory Visit: Payer: Self-pay

## 2022-03-03 ENCOUNTER — Encounter: Payer: Self-pay | Admitting: Specialist

## 2022-03-03 VITALS — BP 119/84 | HR 86 | Ht 74.0 in | Wt 307.0 lb

## 2022-03-03 DIAGNOSIS — M48062 Spinal stenosis, lumbar region with neurogenic claudication: Secondary | ICD-10-CM | POA: Diagnosis not present

## 2022-03-03 DIAGNOSIS — M4807 Spinal stenosis, lumbosacral region: Secondary | ICD-10-CM

## 2022-03-03 DIAGNOSIS — M4819 Ankylosing hyperostosis [Forestier], multiple sites in spine: Secondary | ICD-10-CM | POA: Diagnosis not present

## 2022-03-03 MED ORDER — DICLOFENAC SODIUM 50 MG PO TBEC
50.0000 mg | DELAYED_RELEASE_TABLET | Freq: Two times a day (BID) | ORAL | 3 refills | Status: DC
  Filled 2022-03-03 – 2022-03-11 (×2): qty 60, 30d supply, fill #0
  Filled 2022-03-17: qty 60, 30d supply, fill #1
  Filled 2022-03-21: qty 60, 30d supply, fill #0

## 2022-03-03 NOTE — Patient Instructions (Signed)
Avoid bending, stooping and avoid lifting weights greater than 10 lbs. Avoid prolong standing and walking. Avoid frequent bending and stooping  No lifting greater than 10 lbs. May use ice or moist heat for pain. Weight loss is of benefit. Handicap license is approved. MRI of the lumbar spine to assess for recurrent spinal stenosis. Diclofenac

## 2022-03-03 NOTE — Progress Notes (Addendum)
Office Visit Note   Patient: Blake [name]           Date of Birth: [birth year]           MRN: [id number] Visit Date: 03/03/2022              Requested by: [name], NP [address] [place],  [place] [address] PCP: [name], NP   Assessment & Plan: Visit Diagnoses:  1. Spinal stenosis of lumbosacral region   2. Spinal stenosis of lumbar region with neurogenic claudication   3. Forestier's disease of multiple sites     Plan: Avoid bending, stooping and avoid lifting weights greater than 10 lbs. Avoid prolong standing and walking. Avoid frequent bending and stooping  No lifting greater than 10 lbs. May use ice or moist heat for pain. Weight loss is of benefit. Handicap license is approved. MRI of the lumbar spine to assess for recurrent spinal stenosis Start Diclofenac due to findings of DISH or Forestier disease of the spine causing gradual progressive stiffness of the spine.  Follow-Up Instructions: No follow-ups on file.   Orders:  Orders Placed This Encounter  Procedures   XR Lumbar Spine 2-3 Views   No orders of the defined types were placed in this encounter.     Procedures: No procedures performed   Clinical Data: No additional findings.   Subjective: Chief Complaint  Patient presents with   Lower Back - Pain    51 year old male with past history of CHI with coma for about 10 days due to [place] in 1997. He has had right THR in 2020 and lumbar bilateral partial hemilaminectomies for spinal stenosis in 11/2019. He is having right hip pain and missed appointment with Dr. Ninfa Linden for assessment of the right THR and is having pain into the lower back with radiation into the right lateral thigh and leg. Pain all the way down the right leg and there is right foot toe cramping and he has to wait for the spasm to dissipate. Mainly right buttock and right back. Feels like there is something loose in the back and the pain is worse with walkings  and there is stiffness and he has to stop to relieve the pain. Sitting relieves the pain but sitting too long increases the pain and he has to change positions to relieve that pain. No bowel or bladder difficulty.    Review of Systems  Constitutional: Negative.   HENT: Negative.    Eyes: Negative.   Respiratory: Negative.    Cardiovascular: Negative.   Gastrointestinal: Negative.   Endocrine: Negative.   Genitourinary: Negative.   Musculoskeletal: Negative.   Skin: Negative.   Allergic/Immunologic: Negative.   Neurological: Negative.   Hematological: Negative.   Psychiatric/Behavioral: Negative.       Objective: Vital Signs: BP 119/84 (BP Location: Left Arm, Patient Position: Sitting)   Pulse 86   Ht '6\' 2"'$  (1.88 m)   Wt (!) 307 lb (139.3 kg)   BMI 39.42 kg/m   Physical Exam Constitutional:      Appearance: He is well-developed.  HENT:     Head: Normocephalic and atraumatic.  Eyes:     Pupils: Pupils are equal, round, and reactive to light.  Pulmonary:     Effort: Pulmonary effort is normal.     Breath sounds: Normal breath sounds.  Abdominal:     General: Bowel sounds are normal.     Palpations: Abdomen is soft.  Musculoskeletal:  Cervical back: Normal range of motion and neck supple.     Lumbar back: Negative right straight leg raise test and negative left straight leg raise test.  Skin:    General: Skin is warm and dry.  Neurological:     Mental Status: He is alert and oriented to person, place, and time.  Psychiatric:        Behavior: Behavior normal.        Thought Content: Thought content normal.        Judgment: Judgment normal.    Back Exam   Tenderness  The patient is experiencing tenderness in the lumbar.  Range of Motion  Extension:  abnormal  Flexion:  abnormal  Lateral bend right:  abnormal  Lateral bend left:  abnormal  Rotation right:  abnormal  Rotation left:  abnormal   Muscle Strength  Right Quadriceps:  5/5  Left Quadriceps:   5/5  Right Hamstrings:  5/5  Left Hamstrings:  5/5   Tests  Straight leg raise right: negative Straight leg raise left: negative  Reflexes  Patellar:  3/4 Achilles:  3/4  Other  Toe walk: normal Heel walk: normal Sensation: decreased Scars: present     Specialty Comments:  No specialty comments available.  Imaging: No results found.   PMFS History: Patient Active Problem List   Diagnosis Date Noted   Spinal stenosis, lumbar region with neurogenic claudication 11/25/2019    Priority: High    Class: Chronic   Spinal stenosis of lumbar region with neurogenic claudication 11/25/2019   Cervical dystonia 07/09/2019   Neck pain 07/09/2019   OSA (obstructive sleep apnea) 10/31/2018   Status post total replacement of right hip 09/07/2018   Unilateral primary osteoarthritis, right hip 08/13/2018   Severe obesity (BMI >= 40) (HCC) 08/13/2018   Chronic bilateral low back pain with bilateral sciatica 04/02/2018   Epidural lipomatosis 04/02/2018   Right hip pain 04/02/2018   Preop cardiovascular exam 03/30/2018   Dilated aortic root ([place])    Chest pain 07/27/2017   Palpitations 07/26/2017   ERECTILE DYSFUNCTION, MILD 11/12/2007   Tobacco abuse 11/12/2007   Past Medical History:  Diagnosis Date   Arthritis    Back pain    Dilated aortic root ([place])    22m ascending aorta by echo 09/2017, normal on echo 08/2018   ERECTILE DYSFUNCTION, MILD 11/12/2007   Qualifier: Diagnosis of  By: GCarolyne Littles    [place] of consciousness [place] [place]   unconsciousness   Heart murmur    as a child   History of blood transfusion    1997, received 15 units of blood    Morbid obesity ([facility]    Neck pain    PVC's (premature ventricular contractions)    TOBACCO ABUSE 11/12/2007   Qualifier: Diagnosis of  By: ECarmie EndMD, Erin     Tremor    head and hands , controlled on cymbalta    Family History  Problem Relation Age of Onset   COPD Mother    Leukemia Father    Hypertension  Sister    Diabetes Sister     Past Surgical History:  Procedure Laterality Date   FACIAL FRACTURE SURGERY  09/1995   left side of face crush injury, carnial fracture    LUMBAR LAMINECTOMY/DECOMPRESSION MICRODISCECTOMY N/A 11/25/2019   Procedure: BILATERAL PARTIAL LUMBAR HEMILAMINECTOMIES LUMBAR FOUR-FIVE;  Surgeon: NJessy Oto MD;  [facility]  Service: Orthopedics;  Laterality: N/A;   TOTAL HIP ARTHROPLASTY Right  09/07/2018   Procedure: RIGHT TOTAL HIP ARTHROPLASTY ANTERIOR APPROACH;  Surgeon: Mcarthur Rossetti, MD;  [facility];  Service: Orthopedics;  Laterality: Right;   Social History   Occupational History   Occupation: waiting on disability  Tobacco Use   Smoking status: Every Day    Packs/day: 0.50    Years: 33.00    Total pack years: 16.50    Types: Cigarettes   Smokeless tobacco: Never  Substance and Sexual Activity   Alcohol use: Yes    Alcohol/week: 1.0 standard drink of alcohol    Types: 1 Cans of beer per week   Drug use: No   Sexual activity: Not Currently

## 2022-03-07 ENCOUNTER — Telehealth: Payer: Self-pay | Admitting: Specialist

## 2022-03-07 ENCOUNTER — Other Ambulatory Visit (INDEPENDENT_AMBULATORY_CARE_PROVIDER_SITE_OTHER): Payer: Self-pay | Admitting: Primary Care

## 2022-03-07 DIAGNOSIS — Z1211 Encounter for screening for malignant neoplasm of colon: Secondary | ICD-10-CM

## 2022-03-07 NOTE — Telephone Encounter (Signed)
Pt called requesting his last visit notes be uploaded to his mychart. Pt phone number is [phone].

## 2022-03-08 NOTE — Telephone Encounter (Signed)
I don't know how to do that. But, patient is unable to see it because it's incomplete. Once it's complete, patient will be able to see it.

## 2022-03-08 NOTE — Telephone Encounter (Signed)
I called and advised that once Dr. [name] completes the OV note should go to his my chart.

## 2022-03-09 ENCOUNTER — Other Ambulatory Visit: Payer: Self-pay

## 2022-03-11 ENCOUNTER — Other Ambulatory Visit: Payer: Self-pay

## 2022-03-15 ENCOUNTER — Encounter: Payer: Self-pay | Admitting: Primary Care

## 2022-03-15 ENCOUNTER — Ambulatory Visit (INDEPENDENT_AMBULATORY_CARE_PROVIDER_SITE_OTHER): Payer: Medicaid Other | Admitting: Primary Care

## 2022-03-15 ENCOUNTER — Telehealth (INDEPENDENT_AMBULATORY_CARE_PROVIDER_SITE_OTHER): Payer: Medicaid Other | Admitting: Primary Care

## 2022-03-15 VITALS — BP 120/86 | HR 76 | Temp 98.2°F | Ht 74.0 in | Wt 305.6 lb

## 2022-03-15 DIAGNOSIS — G4733 Obstructive sleep apnea (adult) (pediatric): Secondary | ICD-10-CM

## 2022-03-15 NOTE — Progress Notes (Signed)
$'@Patient'b$  ID: Blake [name], male    DOB: 1971/06/06, 51 y.o.   MRN: [id number]  Chief Complaint  Patient presents with   Consult    Referring provider: [name], NP  HPI: 51 year old male, current smoker. PMH significant dilated aortic root, OSA, spinal stenosis, obesity.  03/15/2022 Patient presents today for sleep consult. He has history of sleep apnea. He had a hard time originally tolerating CPAP. He tells me that because of insurance reasons he never had a proper mask fitting or CPAP titration study. He is ope to resuming CPAP if needed. He still has symptoms snoring and witnessed apnea. Associated daytime sleepiness, non-restorative sleep and wakes up in the morning with a sore throat.  No symptoms of narcolepsy, cataplexy or sleepwalking.  Sleep questionnaire Symptoms-  Snoring, apnea, daytime sleepiness and nonrestorative sleep Prior sleep study- 09/21/2018 >> AHI 32.4/hour (weight 353) Bedtime- 10pm-2am Time to fall asleep- varies; 30 mins to several hours  Nocturnal awakenings- 3-5 times Out of bed/start of day- 7am-10am Weight changes- lost 70lbs Do you operate heavy machinery- No Do you currently wear CPAP- No Do you current wear oxygen- No Epworth-  15   No Known Allergies  Immunization History  Administered Date(s) Administered   Influenza,inj,Quad PF,6+ Mos 05/20/2016, 07/20/2017, 04/26/2018, 04/27/2020, 09/02/2021   PFIZER(Purple Top)SARS-COV-2 Vaccination 02/19/2020   Td 11/12/2007   Tdap 02/05/2018, 07/14/2019    Past Medical History:  Diagnosis Date   Arthritis    Back pain    Dilated aortic root ([place])    56m ascending aorta by echo 09/2017, normal on echo 08/2018   ERECTILE DYSFUNCTION, MILD 11/12/2007   Qualifier: Diagnosis of  By: GCarolyne Littles    [place] of consciousness [place]   unconsciousness   Heart murmur    as a child   History of blood transfusion    1997, received 15 units of blood    Morbid obesity ([place]     Neck pain    PVC's (premature ventricular contractions)    TOBACCO ABUSE 11/12/2007   Qualifier: Diagnosis of  By: ECarmie EndMD, Erin     Tremor    head and hands , controlled on cymbalta    Tobacco History: Social History   Tobacco Use  Smoking Status Every Day   Packs/day: 0.50   Years: 33.00   Total pack years: 16.50   Types: Cigarettes  Smokeless Tobacco Never  Tobacco Comments   1/2 ppd 03/15/22   Ready to quit: Not Answered Counseling given: Not Answered Tobacco comments: 1/2 ppd 03/15/22   Outpatient Medications Prior to Visit  Medication Sig Dispense Refill   sildenafil (VIAGRA) 100 MG tablet Take 0.5-1 tablets (50-100 mg total) by mouth daily as needed for erectile dysfunction. 10 tablet 3   aspirin EC 81 MG tablet Take 1 tablet (81 mg total) by mouth daily. (Patient not taking: Reported on 09/02/2021) 90 tablet 3   atorvastatin (LIPITOR) 40 MG tablet Take 1 tablet (40 mg total) by mouth daily. Please schedule appt for future refills. 1st attempt (Patient not taking: Reported on 03/01/2022) 90 tablet 0   diclofenac (VOLTAREN) 50 MG EC tablet Take 1 tablet (50 mg total) by mouth 2 (two) times daily. (Patient not taking: Reported on 03/15/2022) 60 tablet 3   gabapentin (NEURONTIN) 600 MG tablet Take 600 mg by mouth 3 (three) times daily. (Patient not taking: Reported on 09/02/2021)     HYDROcodone-acetaminophen (NORCO/VICODIN) 5-325 MG tablet Take 1  tablet by mouth 4 (four) times daily. (Patient not taking: Reported on 03/15/2022)     nystatin cream (MYCOSTATIN) Apply topically 2 (two) times daily. (Patient not taking: Reported on 03/15/2022) 90 g 1   pregabalin (LYRICA) 75 MG capsule Take 1 capsule (75 mg total) by mouth 2 (two) times daily. (Patient not taking: Reported on 09/02/2021) 60 capsule 2   Vitamin D, Ergocalciferol, (DRISDOL) 1.25 MG (50000 UNIT) CAPS capsule Take 50,000 Units by mouth once a week. (Patient not taking: Reported on 09/02/2021)     No facility-administered  medications prior to visit.      Review of Systems  Review of Systems   Physical Exam  BP 120/86 (BP Location: Left Arm, Cuff Size: Normal)   Pulse 76   Temp 98.2 F (36.8 C) (Oral)   Ht '6\' 2"'$  (1.88 m)   Wt (!) 305 lb 9.6 oz (138.6 kg)   SpO2 96%   BMI 39.24 kg/m  Physical Exam   Lab Results:  CBC    Component Value Date/Time   WBC 8.8 04/27/2020 1123   WBC 7.8 11/29/2019 1648   RBC 5.17 04/27/2020 1123   RBC 4.72 11/29/2019 1648   HGB 16.3 04/27/2020 1123   HCT 47.6 04/27/2020 1123   PLT 281 04/27/2020 1123   MCV 92 04/27/2020 1123   MCH 31.5 04/27/2020 1123   MCH 31.4 11/29/2019 1648   MCHC 34.2 04/27/2020 1123   MCHC 32.2 11/29/2019 1648   RDW 14.1 04/27/2020 1123   LYMPHSABS 2.0 04/27/2020 1123   MONOABS 0.7 11/29/2019 1648   EOSABS 0.1 04/27/2020 1123   BASOSABS 0.1 04/27/2020 1123    BMET    Component Value Date/Time   NA 140 09/02/2021 1056   K 4.2 09/02/2021 1056   CL 105 09/02/2021 1056   CO2 20 09/02/2021 1056   GLUCOSE 85 09/02/2021 1056   GLUCOSE 98 11/29/2019 1648   BUN 14 09/02/2021 1056   CREATININE 0.87 09/02/2021 1056   CALCIUM 9.2 09/02/2021 1056   GFRNONAA 83 04/27/2020 1123   GFRAA 96 04/27/2020 1123    BNP No results found for: "BNP"  ProBNP No results found for: "PROBNP"  Imaging: No results found.   Assessment & Plan:   OSA (obstructive sleep apnea) - History of sleep apnea.  Sleep study in March 2020 showed severe OSA, AHI 32.4 an hour (weight 353 pounds).  Patient did not originally tolerate CPAP.  He never had mask fitting or CPAP titration study.  No longer on CPAP.  He continues to have symptoms of snoring, witnessed apnea, daytime sleepiness and nonrestorative sleep.  BMI 39. Epworth 15. Recommending in-lab split-night sleep study to reassess for OSA and correct CPAP pressure settings if needed.  Reviewed risks of untreated sleep apnea including cardiac arrhythmias, pulmonary hypertension, stroke and diabetes.   We also discussed treatment options including weight loss, oral appliance, CPAP therapy or referral to ENT for possible surgical options.  Encourage patient is on side sleeping position and continue to work on weight loss efforts.  Advised against driving if experiencing daytime fatigue sleepiness or fatigue.  Follow-up 1 to 2 weeks after completing sleep study to review Zelson treatment options if needed.   Martyn Ehrich, NP 03/15/2022

## 2022-03-15 NOTE — Progress Notes (Signed)
Reviewed and agree with assessment/plan.   Chesley Mires, MD [facility] Pulmonary/Critical Care 03/15/2022, 11:24 AM Pager:  [phone]

## 2022-03-15 NOTE — Patient Instructions (Addendum)
  Sleep apnea is defined as period of 10 seconds or longer when you stop breathing at night. This can happen multiple times a night. Dx sleep apnea is when this occurs more than 5 times an hour.    Mild OSA 5-15 apneic events an hour Moderate OSA 15-30 apneic events an hour Severe OSA > 30 apneic events an hour   Untreated sleep apnea puts you at higher risk for cardiac arrhythmias, pulmonary HTN, stroke and diabetes   Treatment options include weight loss, side sleeping position, oral appliance, CPAP therapy or referral to ENT for possible surgical options    Recommendations: Focus on side sleeping position Work on weight loss efforts if able  Do not drive if experiencing excessive daytime sleepiness of fatigue    Orders: Split night sleep study re: hx osa    Follow-up: Please schedule follow-up 1-2 weeks after completing home sleep study to review results and treatment if needed

## 2022-03-15 NOTE — Assessment & Plan Note (Signed)
-   History of sleep apnea.  Sleep study in March 2020 showed severe OSA, AHI 32.4 an hour (weight 353 pounds).  Patient did not originally tolerate CPAP.  He never had mask fitting or CPAP titration study.  No longer on CPAP.  He continues to have symptoms of snoring, witnessed apnea, daytime sleepiness and nonrestorative sleep.  BMI 39. Epworth 15. Recommending in-lab split-night sleep study to reassess for OSA and correct CPAP pressure settings if needed.  Reviewed risks of untreated sleep apnea including cardiac arrhythmias, pulmonary hypertension, stroke and diabetes.  We also discussed treatment options including weight loss, oral appliance, CPAP therapy or referral to ENT for possible surgical options.  Encourage patient is on side sleeping position and continue to work on weight loss efforts.  Advised against driving if experiencing daytime fatigue sleepiness or fatigue.  Follow-up 1 to 2 weeks after completing sleep study to review Zelson treatment options if needed.

## 2022-03-16 ENCOUNTER — Other Ambulatory Visit: Payer: Self-pay

## 2022-03-17 ENCOUNTER — Telehealth: Payer: Self-pay | Admitting: Specialist

## 2022-03-17 ENCOUNTER — Other Ambulatory Visit: Payer: Self-pay

## 2022-03-17 NOTE — Telephone Encounter (Signed)
Printed for [facility]

## 2022-03-17 NOTE — Telephone Encounter (Signed)
Pt called requesting a different medication his insurance will approve. Pt states Dr [name] prescribed pregablin and insurance will not pay for medication. Please send new script to pharmacy on file. Pt phone number is [phone].

## 2022-03-18 ENCOUNTER — Other Ambulatory Visit: Payer: Self-pay

## 2022-03-21 ENCOUNTER — Other Ambulatory Visit: Payer: Self-pay

## 2022-03-21 ENCOUNTER — Other Ambulatory Visit: Payer: Medicaid Other

## 2022-03-24 ENCOUNTER — Ambulatory Visit: Payer: Medicaid Other | Admitting: Specialist

## 2022-03-28 ENCOUNTER — Other Ambulatory Visit: Payer: Self-pay

## 2022-04-04 ENCOUNTER — Ambulatory Visit
Admission: RE | Admit: 2022-04-04 | Discharge: 2022-04-04 | Disposition: A | Discharge status: Home / Self Care | Payer: Medicaid Other | Source: Ambulatory Visit | Attending: Specialist | Admitting: Specialist

## 2022-04-04 DIAGNOSIS — M4807 Spinal stenosis, lumbosacral region: Secondary | ICD-10-CM

## 2022-04-04 DIAGNOSIS — M48062 Spinal stenosis, lumbar region with neurogenic claudication: Secondary | ICD-10-CM

## 2022-04-04 DIAGNOSIS — M4819 Ankylosing hyperostosis [Forestier], multiple sites in spine: Secondary | ICD-10-CM

## 2022-04-04 MED ORDER — GADOBENATE DIMEGLUMINE 529 MG/ML IV SOLN
20.0000 mL | Freq: Once | INTRAVENOUS | Status: AC | PRN
  Administered 2022-04-04: 20 mL via INTRAVENOUS

## 2022-04-06 ENCOUNTER — Telehealth: Payer: Self-pay | Admitting: Specialist

## 2022-04-06 NOTE — Telephone Encounter (Signed)
I scheduled for 04/07/22

## 2022-04-06 NOTE — Telephone Encounter (Signed)
Pt called and had his mri done but we do not have any openings until 04/21/2022. He wants to be called with the results before then please.   Cb [phone] [phone]

## 2022-04-07 ENCOUNTER — Ambulatory Visit: Payer: Medicaid Other | Admitting: Specialist

## 2022-04-12 ENCOUNTER — Ambulatory Visit (INDEPENDENT_AMBULATORY_CARE_PROVIDER_SITE_OTHER): Payer: Medicaid Other | Admitting: Primary Care

## 2022-04-21 ENCOUNTER — Ambulatory Visit: Payer: Medicaid Other | Admitting: Specialist

## 2022-04-26 ENCOUNTER — Encounter: Payer: Self-pay | Admitting: Physical Medicine & Rehabilitation

## 2022-04-26 ENCOUNTER — Encounter: Payer: Medicaid Other | Attending: Physical Medicine & Rehabilitation | Admitting: Physical Medicine & Rehabilitation

## 2022-04-26 VITALS — BP 151/92 | HR 81 | Ht 74.0 in | Wt 312.8 lb

## 2022-04-26 DIAGNOSIS — M5442 Lumbago with sciatica, left side: Secondary | ICD-10-CM | POA: Diagnosis not present

## 2022-04-26 DIAGNOSIS — Z5181 Encounter for therapeutic drug level monitoring: Secondary | ICD-10-CM | POA: Insufficient documentation

## 2022-04-26 DIAGNOSIS — Z79891 Long term (current) use of opiate analgesic: Secondary | ICD-10-CM | POA: Insufficient documentation

## 2022-04-26 DIAGNOSIS — M160 Bilateral primary osteoarthritis of hip: Secondary | ICD-10-CM | POA: Diagnosis present

## 2022-04-26 DIAGNOSIS — G894 Chronic pain syndrome: Secondary | ICD-10-CM | POA: Diagnosis not present

## 2022-04-26 DIAGNOSIS — M5441 Lumbago with sciatica, right side: Secondary | ICD-10-CM | POA: Diagnosis present

## 2022-04-26 DIAGNOSIS — G8929 Other chronic pain: Secondary | ICD-10-CM | POA: Insufficient documentation
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
## 2022-04-29 ENCOUNTER — Telehealth: Payer: Self-pay | Admitting: Specialist

## 2022-04-29 NOTE — Telephone Encounter (Signed)
Blake [name] with [facility] case manager called stating she faxed over a 30-51 for this pt and it needs to be filled out and faxed back as soon as possible for renewal.   CB [phone]

## 2022-05-02 NOTE — Telephone Encounter (Signed)
Faxed back.

## 2022-05-04 ENCOUNTER — Telehealth: Payer: Self-pay | Admitting: *Deleted

## 2022-05-04 ENCOUNTER — Other Ambulatory Visit: Payer: Self-pay

## 2022-05-04 ENCOUNTER — Other Ambulatory Visit (HOSPITAL_BASED_OUTPATIENT_CLINIC_OR_DEPARTMENT_OTHER): Payer: Self-pay

## 2022-05-04 LAB — TOXASSURE SELECT,+ANTIDEPR,UR

## 2022-05-04 NOTE — Telephone Encounter (Signed)
Urine drug screen is negative for medication as expected. However it is positive for alcohol. Being he is not taking narcotics a simple reminder to not mix alcohol and controlled substances if we prescribe.

## 2022-05-05 ENCOUNTER — Encounter: Payer: Self-pay | Admitting: Physical Medicine & Rehabilitation

## 2022-05-05 MED ORDER — OXYCODONE-ACETAMINOPHEN 5-325 MG PO TABS: 1.0000 | ORAL_TABLET | Freq: Four times a day (QID) | ORAL | 0 refills | Status: DC | PRN

## 2022-05-05 NOTE — Telephone Encounter (Signed)
Patient called inquiring test results and requesting prescription refill.

## 2022-05-06 ENCOUNTER — Telehealth: Payer: Self-pay | Admitting: *Deleted

## 2022-05-06 NOTE — Telephone Encounter (Signed)
Prior auth needed for Blake [name] Oxycodone acetaminophen 5/325. Waiting on insurance info.

## 2022-05-06 NOTE — Telephone Encounter (Signed)
Request Reference Number: [id number]. OXYCOD/APAP TAB 5-'325MG'$  is approved through 11/05/2022. For further questions, call [employer] at [phone].

## 2022-05-06 NOTE — Telephone Encounter (Signed)
Prior auth submitted for oxycodone acetaminophen 5/325 #120 to [place] via CoverMyMeds.

## 2022-05-10 ENCOUNTER — Other Ambulatory Visit: Payer: Self-pay

## 2022-05-24 ENCOUNTER — Encounter: Payer: Medicaid Other | Admitting: Physical Medicine & Rehabilitation

## 2022-05-24 ENCOUNTER — Encounter: Payer: Self-pay | Admitting: Physician Assistant

## 2022-05-27 ENCOUNTER — Encounter: Payer: Self-pay | Admitting: Physical Medicine & Rehabilitation

## 2022-05-27 ENCOUNTER — Encounter: Payer: Medicaid Other | Attending: Physical Medicine & Rehabilitation | Admitting: Physical Medicine & Rehabilitation

## 2022-05-27 VITALS — Ht 74.0 in

## 2022-05-27 DIAGNOSIS — G8929 Other chronic pain: Secondary | ICD-10-CM | POA: Insufficient documentation

## 2022-05-27 DIAGNOSIS — M545 Low back pain, unspecified: Secondary | ICD-10-CM | POA: Diagnosis not present

## 2022-05-27 DIAGNOSIS — M160 Bilateral primary osteoarthritis of hip: Secondary | ICD-10-CM | POA: Diagnosis not present

## 2022-05-27 DIAGNOSIS — G894 Chronic pain syndrome: Secondary | ICD-10-CM | POA: Diagnosis not present

## 2022-05-27 DIAGNOSIS — Z79891 Long term (current) use of opiate analgesic: Secondary | ICD-10-CM | POA: Insufficient documentation

## 2022-05-27 MED ORDER — OXYCODONE-ACETAMINOPHEN 7.5-325 MG PO TABS: 1.0000 | ORAL_TABLET | Freq: Four times a day (QID) | ORAL | 0 refills | Status: DC | PRN
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
## 2022-06-06 ENCOUNTER — Telehealth: Payer: Self-pay

## 2022-06-06 MED ORDER — OXYCODONE-ACETAMINOPHEN 7.5-325 MG PO TABS: 1.0000 | ORAL_TABLET | Freq: Four times a day (QID) | ORAL | 0 refills | Status: DC | PRN

## 2022-06-06 NOTE — Telephone Encounter (Signed)
PA submitted through cover my meds key# [id number]

## 2022-06-06 NOTE — Telephone Encounter (Signed)
PMP was Reviewed.  Dr Marciano Sequin note was reviewed.  Oxycodone e-scribed to pharmacy.  Call place to Mr. Hates, no answer. Left message to return the call.

## 2022-06-27 ENCOUNTER — Telehealth: Payer: Self-pay | Admitting: Physical Medicine & Rehabilitation

## 2022-06-27 NOTE — Progress Notes (Deleted)
06/27/2022 Blake [name] [id number] Nov 26, 1970  Referring provider: [name], NP Primary GI doctor: {acdocs:27040}  ASSESSMENT AND PLAN:   There are no diagnoses linked to this encounter.   Patient Care Team: [name], NP as PCP - General (Internal Medicine) Sueanne Margarita, MD as PCP - Cardiology (Cardiology) Jessy Oto, MD (Inactive) as Consulting Physician (Orthopedic Surgery)  HISTORY OF PRESENT ILLNESS: 51 y.o. male with a past medical history of hyperlipidemia, pain, tobacco abuse, dilated aortic root morbid obesity and others listed below presents for evaluation of diarrhea, hemorrhoids, discussed colonoscopy.    He  reports that he has been smoking cigarettes. He has a 16.50 pack-year smoking history. He has never used smokeless tobacco. He reports current alcohol use of about 1.0 standard drink of alcohol per week. He reports that he does not use drugs.  Current Medications:    Current Outpatient Medications (Cardiovascular):    atorvastatin (LIPITOR) 40 MG tablet, Take 1 tablet (40 mg total) by mouth daily. Please schedule appt for future refills. 1st attempt   sildenafil (VIAGRA) 100 MG tablet, Take 0.5-1 tablets (50-100 mg total) by mouth daily as needed for erectile dysfunction.   Current Outpatient Medications (Analgesics):    aspirin EC 81 MG tablet, Take 1 tablet (81 mg total) by mouth daily.   diclofenac (VOLTAREN) 50 MG EC tablet, Take 1 tablet (50 mg total) by mouth 2 (two) times daily.   oxyCODONE-acetaminophen (PERCOCET) 7.5-325 MG tablet, Take 1 tablet by mouth every 6 (six) hours as needed for severe pain.   Current Outpatient Medications (Other):    nystatin cream (MYCOSTATIN), Apply topically 2 (two) times daily.  Medical History:  Past Medical History:  Diagnosis Date   Arthritis    Back pain    Dilated aortic root ([facility])    36m ascending aorta by echo 09/2017, normal on echo 08/2018   ERECTILE DYSFUNCTION, MILD  11/12/2007   Qualifier: Diagnosis of  By: GCarolyne Littles    Head [place] of consciousness [place]   unconsciousness   Heart murmur    as a child   History of blood transfusion    1997, received 15 units of blood    [place] ([place]    Neck pain    PVC's (premature ventricular contractions)    TOBACCO ABUSE 11/12/2007   Qualifier: Diagnosis of  By: ECarmie EndMD, Erin     Tremor    head and hands , controlled on cymbalta   Allergies: No Known Allergies   Surgical History:  He  has a past surgical history that includes Facial fracture surgery (09/1995); Total hip arthroplasty (Right, 09/07/2018); and Lumbar laminectomy/decompression microdiscectomy (N/A, 11/25/2019). Family History:  His family history includes COPD in his mother; Diabetes in his sister; Hypertension in his sister; Leukemia in his father.  REVIEW OF SYSTEMS  : All other systems reviewed and negative except where noted in the History of Present Illness.  PHYSICAL EXAM: There were no vitals taken for this visit. General:   Pleasant, well developed male in no acute distress Head:   Normocephalic and atraumatic. Eyes:  sclerae anicteric,conjunctive pink  Heart:   {HEART EXAM HEM/ONC:21750} Pulm:  Clear anteriorly; no wheezing Abdomen:   {BlankSingle:19197::"Distended","Ridged","Soft"}, {BlankSingle:19197::"Flat","Obese","Non-distended"} AB, {BlankSingle:19197::"Absent","Hyperactive, tinkling","Hypoactive","Sluggish","Active"} bowel sounds. {actendernessAB:27319} tenderness {anatomy; site abdomen:5010}. {BlankMultiple:19196::"Without guarding","With guarding","Without rebound","With rebound"}, No organomegaly appreciated. Rectal: {acrectalexam:27461} Extremities:  {With/Without:304960234} edema. Msk: Symmetrical without gross deformities. Peripheral pulses intact.  Neurologic:  Alert and  oriented x4;  No focal deficits.  Skin:   Dry and intact without significant lesions or rashes. Psychiatric:  Cooperative.  Normal mood and affect.  RELEVANT LABS AND IMAGING: CBC    Component Value Date/Time   WBC 8.8 04/27/2020 1123   WBC 7.8 11/29/2019 1648   RBC 5.17 04/27/2020 1123   RBC 4.72 11/29/2019 1648   HGB 16.3 04/27/2020 1123   HCT 47.6 04/27/2020 1123   PLT 281 04/27/2020 1123   MCV 92 04/27/2020 1123   MCH 31.5 04/27/2020 1123   MCH 31.4 11/29/2019 1648   MCHC 34.2 04/27/2020 1123   MCHC 32.2 11/29/2019 1648   RDW 14.1 04/27/2020 1123   LYMPHSABS 2.0 04/27/2020 1123   MONOABS 0.7 11/29/2019 1648   EOSABS 0.1 04/27/2020 1123   BASOSABS 0.1 04/27/2020 1123    CMP     Component Value Date/Time   NA 140 09/02/2021 1056   K 4.2 09/02/2021 1056   CL 105 09/02/2021 1056   CO2 20 09/02/2021 1056   GLUCOSE 85 09/02/2021 1056   GLUCOSE 98 11/29/2019 1648   BUN 14 09/02/2021 1056   CREATININE 0.87 09/02/2021 1056   CALCIUM 9.2 09/02/2021 1056   PROT 7.2 09/02/2021 1056   ALBUMIN 4.4 09/02/2021 1056   AST 20 09/02/2021 1056   ALT 22 09/02/2021 1056   ALKPHOS 88 09/02/2021 1056   BILITOT 0.3 09/02/2021 1056   GFRNONAA 83 04/27/2020 [place] 04/27/2020 Lily Velena Keegan, PA-C 12:49 PM

## 2022-06-27 NOTE — Telephone Encounter (Signed)
Patient called to make an appt with Dr. Curlene Dolphin to get his oxycodone.  He doesn't have any openings I have put him with Zella Ball on Wednesday at 3:00.

## 2022-06-28 ENCOUNTER — Ambulatory Visit: Payer: Medicaid Other | Admitting: Physician Assistant

## 2022-06-28 ENCOUNTER — Telehealth: Payer: Self-pay | Admitting: Physician Assistant

## 2022-06-28 NOTE — Telephone Encounter (Signed)
Good Morning Blake [name],   Patient called stating that he needed to reschedule his appointment with you for today at 11:30 due to having to go get his daughter from school due to her getting sick.   Patient was rescheduled for 12/28 at 9:30

## 2022-06-29 ENCOUNTER — Encounter: Payer: Medicaid Other | Attending: Physical Medicine & Rehabilitation | Admitting: Registered Nurse

## 2022-06-29 ENCOUNTER — Encounter: Payer: Self-pay | Admitting: Registered Nurse

## 2022-06-29 VITALS — BP 130/87 | HR 89 | Ht 74.0 in | Wt 322.0 lb

## 2022-06-29 DIAGNOSIS — G894 Chronic pain syndrome: Secondary | ICD-10-CM | POA: Insufficient documentation

## 2022-06-29 DIAGNOSIS — M545 Low back pain, unspecified: Secondary | ICD-10-CM | POA: Diagnosis not present

## 2022-06-29 DIAGNOSIS — Z79891 Long term (current) use of opiate analgesic: Secondary | ICD-10-CM | POA: Diagnosis present

## 2022-06-29 DIAGNOSIS — G8929 Other chronic pain: Secondary | ICD-10-CM | POA: Diagnosis present

## 2022-06-29 DIAGNOSIS — M5441 Lumbago with sciatica, right side: Secondary | ICD-10-CM

## 2022-06-29 DIAGNOSIS — Z5181 Encounter for therapeutic drug level monitoring: Secondary | ICD-10-CM | POA: Diagnosis present

## 2022-06-29 DIAGNOSIS — M5442 Lumbago with sciatica, left side: Secondary | ICD-10-CM | POA: Diagnosis not present

## 2022-06-29 DIAGNOSIS — M160 Bilateral primary osteoarthritis of hip: Secondary | ICD-10-CM

## 2022-06-29 NOTE — Progress Notes (Signed)
Subjective:    Patient ID: Blake [name], male    DOB: [birth year], 51 y.o.   MRN: [id number]  HPI: Blake [name] is a 51 y.o. male who returns for follow up appointment for chronic pain and medication refill. He states his pain is located in his lower back pain, right hip pain and right lower extremity. He also reports left ankle pain. He rates his pain 6. His current exercise regime is walking.  Blake [name] Morphine equivalent is 45.00 MME.  Blake [name] admits he drinks alcohol, he states he wasn't aware , he's not suppose to drink alcohol. Narcotic Contract reviewed, he verbalizes understanding.    UDS ordered Today.      Pain Inventory Average Pain 8 Pain Right Now 6 My pain is constant, sharp, and aching  In the last 24 hours, has pain interfered with the following? General activity 6 Relation with others 5 Enjoyment of life 8 What TIME of day is your pain at its worst? daytime and night Sleep (in general) Poor  Pain is worse with: walking, bending, sitting, and standing Pain improves with: medication Relief from Meds: 6  Family History  Problem Relation Age of Onset   COPD Mother    Leukemia Father    Hypertension Sister    Diabetes Sister    Social History   Socioeconomic History   Marital status: Single    Spouse name: Not on file   Number of children: 3   Years of education: some college   Highest education level: Not on file  Occupational History   Occupation: waiting on disability  Tobacco Use   Smoking status: Every Day    Packs/day: 0.50    Years: 33.00    Total pack years: 16.50    Types: Cigarettes   Smokeless tobacco: Never   Tobacco comments:    1/2 ppd 03/15/22  Vaping Use   Vaping Use: Never used  Substance and Sexual Activity   Alcohol use: Yes    Alcohol/week: 1.0 standard drink of alcohol    Types: 1 Cans of beer per week   Drug use: No   Sexual activity: Not Currently  Other Topics Concern   Not on file  Social History Narrative   Lives  at home alone.   Right-handed.   No daily caffeine use.   Social Determinants of Health   Financial Resource Strain: Not on file  Food Insecurity: Not on file  Transportation Needs: Not on file  Physical Activity: Not on file  Stress: Not on file  Social Connections: Not on file   Past Surgical History:  Procedure Laterality Date   FACIAL FRACTURE SURGERY  09/1995   left side of face crush injury, carnial fracture    LUMBAR LAMINECTOMY/DECOMPRESSION MICRODISCECTOMY N/A 11/25/2019   Procedure: BILATERAL PARTIAL LUMBAR HEMILAMINECTOMIES LUMBAR FOUR-FIVE;  Surgeon: Jessy Oto, MD;  Location: [place];  Service: Orthopedics;  Laterality: N/A;   TOTAL HIP ARTHROPLASTY Right 09/07/2018   Procedure: RIGHT TOTAL HIP ARTHROPLASTY ANTERIOR APPROACH;  Surgeon: Mcarthur Rossetti, MD;  [facility];  Service: Orthopedics;  Laterality: Right;   Past Surgical History:  Procedure Laterality Date   FACIAL FRACTURE SURGERY  09/1995   left side of face crush injury, carnial fracture    LUMBAR LAMINECTOMY/DECOMPRESSION MICRODISCECTOMY N/A 11/25/2019   Procedure: BILATERAL PARTIAL LUMBAR HEMILAMINECTOMIES LUMBAR FOUR-FIVE;  Surgeon: Jessy Oto, MD;  Location: [place];  Service: Orthopedics;  Laterality: N/A;   TOTAL HIP ARTHROPLASTY Right 09/07/2018  Procedure: RIGHT TOTAL HIP ARTHROPLASTY ANTERIOR APPROACH;  Surgeon: Mcarthur Rossetti, MD;  [facility];  Service: Orthopedics;  Laterality: Right;   Past Medical History:  Diagnosis Date   Arthritis    Back pain    Dilated aortic root ([place])    31m ascending aorta by echo 09/2017, normal on echo 08/2018   ERECTILE DYSFUNCTION, MILD 11/12/2007   Qualifier: Diagnosis of  By: GCarolyne Littles    [place]   unconsciousness   Heart murmur    as a child   History of blood transfusion    1997, received 15 units of blood    [place]    Neck pain    PVC's (premature ventricular  contractions)    TOBACCO ABUSE 11/12/2007   Qualifier: Diagnosis of  By: ECarmie EndMD, Erin     Tremor    head and hands , controlled on cymbalta   Ht '6\' 2"'$  (1.88 m)   Wt (!) 322 lb (146.1 kg)   BMI 41.34 kg/m   Opioid Risk Score:   Fall Risk Score:  `1  [facility]     06/29/2022    3:14 PM 05/27/2022    9:01 AM 04/26/2022    9:28 AM 03/01/2022   10:15 AM 09/16/2021    3:24 PM 09/02/2021   10:16 AM 04/27/2020   10:25 AM  Depression screen PHQ 2/9  Decreased Interest 0 0 1 0 0 0 0  Down, Depressed, Hopeless 0 0 0 0 0 0 0  PHQ - 2 Score 0 0 1 0 0 0 0  Altered sleeping   2      Tired, decreased energy   2      Change in appetite   0      Feeling bad or failure about yourself    0      Trouble concentrating   1      Moving slowly or fidgety/restless   0      Suicidal thoughts   0      PHQ-9 Score   6      Difficult doing work/chores   Somewhat difficult        Review of Systems  Genitourinary:  Negative for penile discharge.  Musculoskeletal:  Positive for back pain and gait problem.       Pain: right hip, right thigh, left knee  All other systems reviewed and are negative.     Objective:   Physical Exam Vitals and nursing note reviewed.  Constitutional:      Appearance: Normal appearance.  Cardiovascular:     Rate and Rhythm: Normal rate and regular rhythm.     Pulses: Normal pulses.     Heart sounds: Normal heart sounds.  Pulmonary:     Effort: Pulmonary effort is normal.     Breath sounds: Normal breath sounds.  Musculoskeletal:     Cervical back: Normal range of motion and neck supple.     Comments: Normal Muscle Bulk and Muscle Testing Reveals:  Upper Extremities: Full ROM and Muscle Strength 5/5  Lumbar Paraspinal Tenderness: L-3-L-5 Right Greater Trochanter Tenderness Lower Extremities: Right: Decreased ROM and Muscle Strength 5/5 Right Lower Extremity Flexion Produces Pain into his Right Hip and Right Knee Pain  Left Lower Extremity: Full  ROM and Muscle Strength 5/5 Arises from Table with Ease Narrow Based  Gait     Skin:    General: Skin is  warm and dry.  Neurological:     Mental Status: He is alert and oriented to person, place, and time.  Psychiatric:        Mood and Affect: Mood normal.        Behavior: Behavior normal.          Assessment & Plan:  Chronic Mid- Line- Low Back Pain: Continue HEP as Tolerated. Continue to Monitor.  OA: Bilateral Hips: Continue HEP as Tolerated. Continue to Monitor.  Chronic Pain Syndrome: Continue Oxycodone 7.5 mg/325 mg one tablet every 6hours as needed for pain #120. We will continue the opioid monitoring program, this consists of regular clinic visits, examinations, urine drug screen, pill counts as well as use of [place] Controlled Substance Reporting system. A 12 month History has been reviewed on the [place] Controlled Substance Reporting System on 06/29/2022.  F/U with Dr Marciano Sequin

## 2022-06-30 ENCOUNTER — Other Ambulatory Visit: Payer: Self-pay

## 2022-07-03 LAB — TOXASSURE SELECT,+ANTIDEPR,UR

## 2022-07-05 ENCOUNTER — Telehealth: Payer: Self-pay

## 2022-07-05 MED ORDER — OXYCODONE-ACETAMINOPHEN 7.5-325 MG PO TABS: ORAL_TABLET | ORAL | 0 refills | Status: DC

## 2022-07-05 NOTE — Telephone Encounter (Signed)
(  Please review and sign off. Thank you)  A generic message has been left for Blake [name] to return the call  to [facility]. (Patient has no voicemail ID).    Please inform Blake [name]:   Patient UDS was positive for  a illicit substance (Cocaine).  [name] NP  will send in  a final Rx of Oxycodone To wean him down. However there will no more narcotics prescribed by [facility].   Patient has been informed of the UDS result and the weaning down process. He was advised to call [facility] for follow up questions .

## 2022-07-05 NOTE — Progress Notes (Deleted)
07/05/2022 Blake [name] [id number] Dec 24, 1970  Referring provider: [name], NP Primary GI doctor: {acdocs:27040}  ASSESSMENT AND PLAN:   There are no diagnoses linked to this encounter.   Patient Care Team: [name], NP as PCP - General (Internal Medicine) Sueanne Margarita, MD as PCP - Cardiology (Cardiology) Jessy Oto, MD (Inactive) as Consulting Physician (Orthopedic Surgery)  HISTORY OF PRESENT ILLNESS: 51 y.o. male with a past medical history of hyperlipidemia, pain, tobacco abuse, dilated aortic root morbid obesity and others listed below presents for evaluation of diarrhea, hemorrhoids, discuss colonoscopy.   CBC  04/27/2020  HGB 16.3 MCV 92 without evidence of anemia WBC 8.8 Platelets 281 Kidney function 09/02/2021  BUN 14 Cr 0.87  GFR 83  Potassium 4.2   LFTs 09/02/2021  AST 20 ALT 22 Alkphos 88 TBili 0.3  He  reports that he has been smoking cigarettes. He has a 16.50 pack-year smoking history. He has never used smokeless tobacco. He reports current alcohol use of about 1.0 standard drink of alcohol per week. He reports that he does not use drugs.  Current Medications:    Current Outpatient Medications (Cardiovascular):    atorvastatin (LIPITOR) 40 MG tablet, Take 1 tablet (40 mg total) by mouth daily. Please schedule appt for future refills. 1st attempt (Patient not taking: Reported on 06/29/2022)   sildenafil (VIAGRA) 100 MG tablet, Take 0.5-1 tablets (50-100 mg total) by mouth daily as needed for erectile dysfunction.   Current Outpatient Medications (Analgesics):    aspirin EC 81 MG tablet, Take 1 tablet (81 mg total) by mouth daily.   diclofenac (VOLTAREN) 50 MG EC tablet, Take 1 tablet (50 mg total) by mouth 2 (two) times daily. (Patient not taking: Reported on 06/29/2022)   oxyCODONE-acetaminophen (PERCOCET) 7.5-325 MG tablet, One tablet every 8 hours for a week. The following one tablet twice a day for a week. The third week  one tablet daily for a week. The fourth week one tablet every other day for a week then discontinued   Current Outpatient Medications (Other):    nystatin cream (MYCOSTATIN), Apply topically 2 (two) times daily.  Medical History:  Past Medical History:  Diagnosis Date   Arthritis    Back pain    Dilated aortic root (HCC)    68m ascending aorta by echo 09/2017, normal on echo 08/2018   ERECTILE DYSFUNCTION, MILD 11/12/2007   Qualifier: Diagnosis of  By: GCarolyne Littles    [place] of [place]   unconsciousness   Heart murmur    as a child   History of blood transfusion    1997, received 15 units of blood    Morbid obesity ([facility]    Neck pain    PVC's (premature ventricular contractions)    TOBACCO ABUSE 11/12/2007   Qualifier: Diagnosis of  By: ECarmie EndMD, Erin     Tremor    head and hands , controlled on cymbalta   Allergies: No Known Allergies   Surgical History:  He  has a past surgical history that includes Facial fracture surgery (09/1995); Total hip arthroplasty (Right, 09/07/2018); and Lumbar laminectomy/decompression microdiscectomy (N/A, 11/25/2019). Family History:  His family history includes COPD in his mother; Diabetes in his sister; Hypertension in his sister; Leukemia in his father.  REVIEW OF SYSTEMS  : All other systems reviewed and negative except where noted in the History of Present Illness.  PHYSICAL EXAM: There were no vitals taken for this  visit. General:   Pleasant, well developed male in no acute distress Head:   Normocephalic and atraumatic. Eyes:  sclerae anicteric,conjunctive pink  Heart:   {HEART EXAM HEM/ONC:21750} Pulm:  Clear anteriorly; no wheezing Abdomen:   {BlankSingle:19197::"Distended","Ridged","Soft"}, {BlankSingle:19197::"Flat","Obese","Non-distended"} AB, {BlankSingle:19197::"Absent","Hyperactive, tinkling","Hypoactive","Sluggish","Active"} bowel sounds. {actendernessAB:27319} tenderness {anatomy; site  abdomen:5010}. {BlankMultiple:19196::"Without guarding","With guarding","Without rebound","With rebound"}, No organomegaly appreciated. Rectal: {acrectalexam:27461} Extremities:  {With/Without:304960234} edema. Msk: Symmetrical without gross deformities. Peripheral pulses intact.  Neurologic:  Alert and  oriented x4;  No focal deficits.  Skin:   Dry and intact without significant lesions or rashes. Psychiatric:  Cooperative. Normal mood and affect.  RELEVANT LABS AND IMAGING: CBC    Component Value Date/Time   WBC 8.8 04/27/2020 1123   WBC 7.8 11/29/2019 1648   RBC 5.17 04/27/2020 1123   RBC 4.72 11/29/2019 1648   HGB 16.3 04/27/2020 1123   HCT 47.6 04/27/2020 1123   PLT 281 04/27/2020 1123   MCV 92 04/27/2020 1123   MCH 31.5 04/27/2020 1123   MCH 31.4 11/29/2019 1648   MCHC 34.2 04/27/2020 1123   MCHC 32.2 11/29/2019 1648   RDW 14.1 04/27/2020 1123   LYMPHSABS 2.0 04/27/2020 1123   MONOABS 0.7 11/29/2019 1648   EOSABS 0.1 04/27/2020 1123   BASOSABS 0.1 04/27/2020 1123    CMP     Component Value Date/Time   NA 140 09/02/2021 1056   K 4.2 09/02/2021 1056   CL 105 09/02/2021 1056   CO2 20 09/02/2021 1056   GLUCOSE 85 09/02/2021 1056   GLUCOSE 98 11/29/2019 1648   BUN 14 09/02/2021 1056   CREATININE 0.87 09/02/2021 1056   CALCIUM 9.2 09/02/2021 1056   PROT 7.2 09/02/2021 1056   ALBUMIN 4.4 09/02/2021 1056   AST 20 09/02/2021 1056   ALT 22 09/02/2021 1056   ALKPHOS 88 09/02/2021 1056   BILITOT 0.3 09/02/2021 1056   GFRNONAA 83 04/27/2020 [place] 04/27/2020 [place], PA-C 1:01 PM

## 2022-07-07 ENCOUNTER — Ambulatory Visit: Payer: Medicaid Other | Admitting: Physician Assistant

## 2022-07-08 ENCOUNTER — Other Ambulatory Visit: Payer: Self-pay

## 2022-07-14 ENCOUNTER — Ambulatory Visit (HOSPITAL_BASED_OUTPATIENT_CLINIC_OR_DEPARTMENT_OTHER): Payer: Medicaid Other | Attending: Primary Care | Admitting: Pulmonary Disease

## 2022-07-28 ENCOUNTER — Ambulatory Visit: Payer: Medicaid Other | Admitting: Physical Medicine & Rehabilitation

## 2022-08-29 ENCOUNTER — Telehealth: Payer: Self-pay | Admitting: Emergency Medicine

## 2022-08-29 NOTE — Telephone Encounter (Signed)
Copied from [place] [id number]. Topic: Referral - Question >> Aug 29, 2022 12:37 PM Penni Bombard wrote: Reason for CRM: pt needs a new pain mgmt referral [facility]/  CB@  [phone]

## 2022-08-31 NOTE — Telephone Encounter (Signed)
You did place a pain management referral back in 02/2022. Pt is requesting a new referral. Looks like pt was discharged because of recent UDS.

## 2022-09-05 NOTE — Telephone Encounter (Signed)
Returned pt call to go over provider response pt didn't answer lvm

## 2022-09-06 ENCOUNTER — Telehealth: Payer: Self-pay | Admitting: Primary Care

## 2022-09-06 NOTE — Telephone Encounter (Signed)
Copied from [employer] [id number]. Topic: General - Other >> Sep 06, 2022  1:59 PM Everette C wrote: Reason for CRM: The patient has returned a missed call from the practice regarding their recently requested referral   Please contact further when possible

## 2022-09-08 ENCOUNTER — Other Ambulatory Visit (INDEPENDENT_AMBULATORY_CARE_PROVIDER_SITE_OTHER): Payer: Self-pay | Admitting: Primary Care

## 2022-09-08 DIAGNOSIS — G8929 Other chronic pain: Secondary | ICD-10-CM

## 2022-09-08 NOTE — Telephone Encounter (Signed)
Request sent 

## 2022-09-08 NOTE — Telephone Encounter (Signed)
Returned pt call and went over provider response. Pt states he would like the referral placed to another pain clinic

## 2022-09-09 ENCOUNTER — Other Ambulatory Visit (INDEPENDENT_AMBULATORY_CARE_PROVIDER_SITE_OTHER): Payer: Self-pay | Admitting: Primary Care

## 2022-09-09 DIAGNOSIS — N529 Male erectile dysfunction, unspecified: Secondary | ICD-10-CM

## 2022-09-12 ENCOUNTER — Other Ambulatory Visit: Payer: Self-pay

## 2022-09-14 ENCOUNTER — Other Ambulatory Visit: Payer: Self-pay | Admitting: Cardiology

## 2022-09-14 ENCOUNTER — Other Ambulatory Visit (INDEPENDENT_AMBULATORY_CARE_PROVIDER_SITE_OTHER): Payer: Self-pay | Admitting: Primary Care

## 2022-09-14 DIAGNOSIS — N529 Male erectile dysfunction, unspecified: Secondary | ICD-10-CM

## 2022-09-15 ENCOUNTER — Encounter: Payer: Self-pay | Admitting: Radiology

## 2022-09-19 ENCOUNTER — Other Ambulatory Visit: Payer: Self-pay

## 2022-09-21 ENCOUNTER — Encounter (INDEPENDENT_AMBULATORY_CARE_PROVIDER_SITE_OTHER): Payer: Self-pay

## 2022-09-26 ENCOUNTER — Encounter: Payer: Self-pay | Admitting: Physician Assistant

## 2022-09-27 ENCOUNTER — Telehealth: Payer: Self-pay | Admitting: Pulmonary Disease

## 2022-09-27 NOTE — Telephone Encounter (Signed)
Patient would like to reschedule split night study. Please call patient to reschedule.

## 2022-10-03 ENCOUNTER — Ambulatory Visit (INDEPENDENT_AMBULATORY_CARE_PROVIDER_SITE_OTHER): Payer: Medicaid Other | Admitting: Primary Care

## 2022-10-17 NOTE — Telephone Encounter (Signed)
Per patient's chart, he is now scheduled for 4/16. Will close encounter.

## 2022-10-18 ENCOUNTER — Ambulatory Visit (INDEPENDENT_AMBULATORY_CARE_PROVIDER_SITE_OTHER): Payer: Medicaid Other | Admitting: Primary Care

## 2022-10-19 ENCOUNTER — Ambulatory Visit: Payer: Medicaid Other | Admitting: Orthopedic Surgery

## 2022-10-20 ENCOUNTER — Ambulatory Visit: Payer: Medicaid Other | Admitting: Orthopedic Surgery

## 2022-10-20 DIAGNOSIS — G894 Chronic pain syndrome: Secondary | ICD-10-CM | POA: Insufficient documentation

## 2022-10-20 DIAGNOSIS — M899 Disorder of bone, unspecified: Secondary | ICD-10-CM | POA: Insufficient documentation

## 2022-10-20 DIAGNOSIS — Z789 Other specified health status: Secondary | ICD-10-CM | POA: Insufficient documentation

## 2022-10-20 DIAGNOSIS — Z79899 Other long term (current) drug therapy: Secondary | ICD-10-CM | POA: Insufficient documentation

## 2022-10-20 NOTE — Progress Notes (Unsigned)
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 distress There were no vitals filed for this visit. BMI Assessment: Estimated body mass index is 41.34 kg/m as calculated from the following:   Height as of 06/29/22: 6\' 2"  (1.88 m).   Weight as of 06/29/22: 322 lb (146.1 kg).  BMI interpretation table: BMI level Category Range association with higher incidence of chronic pain  <18 kg/m2 Underweight   18.5-24.9 kg/m2 Ideal body weight   25-29.9 kg/m2 Overweight Increased incidence by 20%  30-34.9 kg/m2 Obese (Class I) Increased incidence by 68%  35-39.9 kg/m2 Severe obesity (Class II) Increased incidence by 136%  >40 kg/m2 Extreme obesity (Class III) Increased incidence by 254%   Patient's current BMI Ideal Body weight  There is no height or weight on file to calculate BMI. Patient  weight not recorded   BMI Readings from Last 4 Encounters:  06/29/22 41.34 kg/m  05/27/22 40.16 kg/m  04/26/22 40.16 kg/m  03/15/22 39.24 kg/m   Wt Readings from Last 4 Encounters:  06/29/22 (!) 322 lb (146.1 kg)  04/26/22 (!) 312 lb 12.8 oz (141.9 kg)  03/15/22 (!) 305 lb 9.6 oz (138.6 kg)  03/03/22 (!) 307 lb (139.3 kg)    Psych/Mental status: Alert, oriented x 3 (person, place, & time)       Eyes: PERLA Respiratory: No evidence of acute respiratory distress  Assessment  Primary Diagnosis & Pertinent Problem List: The primary encounter diagnosis was Chronic pain syndrome. Diagnoses of Pharmacologic therapy, Disorder of skeletal system, and Problems influencing health status were also pertinent to this visit.  Visit Diagnosis (New problems to examiner): 1. Chronic pain syndrome   2. Pharmacologic therapy   3. Disorder of skeletal system   4. Problems influencing health status    Plan of Care (Initial workup plan)  Note: Mr. Stillinger was reminded that as per protocol, today's visit has been an evaluation only. We have not taken over the patient's controlled substance management.  Problem-specific plan: No problem-specific Assessment & Plan notes found for this  encounter.  Lab Orders  No laboratory test(s) ordered today   Imaging Orders  No imaging studies ordered today   Referral Orders  No referral(s) requested today   Procedure Orders    No procedure(s) ordered today   Pharmacotherapy (current): Medications ordered:  No orders of the defined types were placed in this encounter.  Medications administered during this visit: Hobson Lax had no medications administered during this visit.   Analgesic Pharmacotherapy:  Opioid Analgesics: For patients currently taking or requesting to take opioid analgesics, in accordance with [facility] Guidelines, we will assess their risks and indications for the use of these substances. After completing our  evaluation, we may offer recommendations, but we no longer take patients for medication management. The prescribing physician will ultimately decide, based on his/her training and level of comfort whether to adopt any of the recommendations, including whether or not to prescribe such medicines.  Membrane stabilizer: To be determined at a later time  Muscle relaxant: To be determined at a later time  NSAID: To be determined at a later time  Other analgesic(s): To be determined at a later time   Interventional management options: Mr. Shiffler was informed that there is no guarantee that he would be a candidate for interventional therapies. The decision will be based on the results of diagnostic studies, as well as Mr. Hearn risk profile.  Procedure(s) under consideration:  Pending results of ordered studies      Interventional Therapies  Risk Factors  Considerations:     Planned  Pending:      Under consideration:   Pending   Completed:   None at this time   Therapeutic  Palliative (PRN) options:   None established   Completed by other providers:   None reported      Provider-requested follow-up: No follow-ups on file.  Future Appointments  Date Time Provider Department Center  10/24/2022  9:00 AM Delano Metz, MD [facility] None  10/25/2022  8:00 PM Oretha Milch, MD [facility]  11/07/2022  9:30 AM Unk Lightning, PA [facility]  03/02/2023  9:30 AM Grayce Sessions, NP [facility] None    Duration of encounter: *** minutes.  Total time on encounter, as per AMA guidelines included both the face-to-face and non-face-to-face time personally spent by the physician and/or other qualified health care professional(s) on the day of the encounter (includes time in activities that require the physician or other qualified health care professional and does not include time in activities normally performed by clinical staff). Physician's time may include the  following activities when performed: Preparing to see the patient (e.g., pre-charting review of records, searching for previously ordered imaging, lab work, and nerve conduction tests) Review of prior analgesic pharmacotherapies. Reviewing PMP Interpreting ordered tests (e.g., lab work, imaging, nerve conduction tests) Performing post-procedure evaluations, including interpretation of diagnostic procedures Obtaining and/or reviewing separately obtained history Performing a medically appropriate examination and/or evaluation Counseling and educating the patient/family/caregiver Ordering medications, tests, or procedures Referring and communicating with other health care professionals (when not separately reported) Documenting clinical information in the electronic or other health record Independently interpreting results (not separately reported) and communicating results to the patient/ family/caregiver Care coordination (not separately reported)  Note by: Oswaldo Done, MD (TTS technology used. I apologize for any typographical errors that were not detected and corrected.) Date: 10/24/2022; Time: 10:18 AM

## 2022-10-20 NOTE — Patient Instructions (Signed)
____________________________________________________________________________________________  New Patients  Welcome to [facility].   Initial Visit The first or initial visit consists of an evaluation only.   Interventional pain management.  We offer therapies other than opioid controlled substances to manage chronic pain. These include, but are not limited to, diagnostic, therapeutic, and palliative specialized injection therapies (i.e.: Epidural Steroids, Facet Blocks, etc.). We specialize in a variety of nerve blocks as well as radiofrequency treatments. We offer pain implant evaluations and trials, as well as follow up management. In addition we also provide a variety joint injections, including Viscosupplementation (AKA: Gel Therapy).  Prescription Pain Medication. We specialize in alternatives to opioids. We can provide evaluations and recommendations for/of pharmacologic therapies based on CDC Guidelines.  We no longer take patients for long-term medication management. We will not be taking over your pain medications.  ____________________________________________________________________________________________    ____________________________________________________________________________________________  Patient Information update  To: All of our patients.  Re: Name change.  It has been made official that our current name, "[facility]"   will soon be changed to "[facility]".   The purpose of this change is to eliminate any confusion created by the concept of our practice being a "Medication Management Pain Clinic". In the past this has led to the misconception that we treat pain primarily by the use of prescription medications.  Nothing can be farther from the truth.   Understanding PAIN MANAGEMENT: To  further understand what our practice does, you first have to understand that "Pain Management" is a subspecialty that requires additional training once a physician has completed their specialty training, which can be in either Anesthesia, Neurology, Psychiatry, or Physical Medicine and Rehabilitation (PMR). Each one of these contributes to the final approach taken by each physician to the management of their patient's pain. To be a "Pain Management Specialist" you must have first completed one of the specialty trainings below.  Anesthesiologists - trained in clinical pharmacology and interventional techniques such as nerve blockade and regional as well as central neuroanatomy. They are trained to block pain before, during, and after surgical interventions.  Neurologists - trained in the diagnosis and pharmacological treatment of complex neurological conditions, such as Multiple Sclerosis, Parkinson's, spinal cord injuries, and other systemic conditions that may be associated with symptoms that may include but are not limited to pain. They tend to rely primarily on the treatment of chronic pain using prescription medications.  Psychiatrist - trained in conditions affecting the psychosocial wellbeing of patients including but not limited to depression, anxiety, schizophrenia, personality disorders, addiction, and other substance use disorders that may be associated with chronic pain. They tend to rely primarily on the treatment of chronic pain using prescription medications.   Physical Medicine and Rehabilitation (PMR) physicians, also known as physiatrists - trained to treat a wide variety of medical conditions affecting the brain, spinal cord, nerves, bones, joints, ligaments, muscles, and tendons. Their training is primarily aimed at treating patients that have suffered injuries that have caused severe physical impairment. Their training is primarily aimed at the physical therapy and rehabilitation of those  patients. They may also work alongside orthopedic surgeons or neurosurgeons using their expertise in assisting surgical patients to recover after their surgeries.  INTERVENTIONAL PAIN MANAGEMENT is sub-subspecialty of Pain Management.  Our physicians are Board-certified in Anesthesia, Pain Management, and Interventional Pain Management.  This meaning that not only have they been trained   and Board-certified in their specialty of Anesthesia, and subspecialty of Pain Management, but they have also received further training in the sub-subspecialty of Interventional Pain Management, in order to become Board-certified as INTERVENTIONAL PAIN MANAGEMENT SPECIALIST.    Mission: Our goal is to use our skills in  INTERVENTIONAL PAIN MANAGEMENT as alternatives to the chronic use of prescription opioid medications for the treatment of pain. To make this more clear, we have changed our name to reflect what we do and offer. We will continue to offer medication management assessment and recommendations, but we will not be taking over any patient's medication management.  ____________________________________________________________________________________________     

## 2022-10-24 ENCOUNTER — Ambulatory Visit: Payer: Medicaid Other | Attending: Pain Medicine | Admitting: Pain Medicine

## 2022-10-24 ENCOUNTER — Encounter: Payer: Self-pay | Admitting: Pain Medicine

## 2022-10-24 VITALS — BP 135/95 | HR 90 | Temp 97.5°F | Resp 18 | Ht 74.0 in | Wt 329.0 lb

## 2022-10-24 DIAGNOSIS — M5137 Other intervertebral disc degeneration, lumbosacral region: Secondary | ICD-10-CM | POA: Diagnosis present

## 2022-10-24 DIAGNOSIS — M47816 Spondylosis without myelopathy or radiculopathy, lumbar region: Secondary | ICD-10-CM | POA: Diagnosis present

## 2022-10-24 DIAGNOSIS — M47812 Spondylosis without myelopathy or radiculopathy, cervical region: Secondary | ICD-10-CM | POA: Insufficient documentation

## 2022-10-24 DIAGNOSIS — Z96641 Presence of right artificial hip joint: Secondary | ICD-10-CM | POA: Insufficient documentation

## 2022-10-24 DIAGNOSIS — M545 Low back pain, unspecified: Secondary | ICD-10-CM | POA: Diagnosis present

## 2022-10-24 DIAGNOSIS — M5417 Radiculopathy, lumbosacral region: Secondary | ICD-10-CM | POA: Insufficient documentation

## 2022-10-24 DIAGNOSIS — G894 Chronic pain syndrome: Secondary | ICD-10-CM

## 2022-10-24 DIAGNOSIS — M5410 Radiculopathy, site unspecified: Secondary | ICD-10-CM | POA: Diagnosis present

## 2022-10-24 DIAGNOSIS — G8929 Other chronic pain: Secondary | ICD-10-CM | POA: Diagnosis present

## 2022-10-24 DIAGNOSIS — M899 Disorder of bone, unspecified: Secondary | ICD-10-CM | POA: Diagnosis present

## 2022-10-24 DIAGNOSIS — Z79899 Other long term (current) drug therapy: Secondary | ICD-10-CM | POA: Insufficient documentation

## 2022-10-24 DIAGNOSIS — R937 Abnormal findings on diagnostic imaging of other parts of musculoskeletal system: Secondary | ICD-10-CM | POA: Diagnosis present

## 2022-10-24 DIAGNOSIS — M5459 Other low back pain: Secondary | ICD-10-CM | POA: Insufficient documentation

## 2022-10-24 DIAGNOSIS — M25551 Pain in right hip: Secondary | ICD-10-CM | POA: Diagnosis present

## 2022-10-24 DIAGNOSIS — M79604 Pain in right leg: Secondary | ICD-10-CM | POA: Diagnosis present

## 2022-10-24 DIAGNOSIS — Z789 Other specified health status: Secondary | ICD-10-CM | POA: Insufficient documentation

## 2022-10-24 DIAGNOSIS — D1779 Benign lipomatous neoplasm of other sites: Secondary | ICD-10-CM | POA: Insufficient documentation

## 2022-10-24 DIAGNOSIS — M4726 Other spondylosis with radiculopathy, lumbar region: Secondary | ICD-10-CM | POA: Diagnosis not present

## 2022-10-24 DIAGNOSIS — M961 Postlaminectomy syndrome, not elsewhere classified: Secondary | ICD-10-CM | POA: Diagnosis present

## 2022-10-24 DIAGNOSIS — M5441 Lumbago with sciatica, right side: Secondary | ICD-10-CM

## 2022-10-24 DIAGNOSIS — M5030 Other cervical disc degeneration, unspecified cervical region: Secondary | ICD-10-CM | POA: Insufficient documentation

## 2022-10-24 MED ORDER — METHOCARBAMOL 1000 MG/10ML IJ SOLN
INTRAMUSCULAR | Status: AC
  Filled 2022-10-24: qty 10

## 2022-10-24 MED ORDER — KETOROLAC TROMETHAMINE 60 MG/2ML IM SOLN
60.0000 mg | Freq: Once | INTRAMUSCULAR | Status: AC
  Administered 2022-10-24: 60 mg via INTRAMUSCULAR

## 2022-10-24 MED ORDER — METHOCARBAMOL 1000 MG/10ML IJ SOLN
200.0000 mg | Freq: Once | INTRAMUSCULAR | Status: AC
  Administered 2022-10-24: 200 mg via INTRAMUSCULAR

## 2022-10-24 MED ORDER — KETOROLAC TROMETHAMINE 60 MG/2ML IM SOLN
INTRAMUSCULAR | Status: AC
  Filled 2022-10-24: qty 2

## 2022-10-24 NOTE — Progress Notes (Signed)
Safety precautions to be maintained throughout the outpatient stay will include: orient to surroundings, keep bed in low position, maintain call bell within reach at all times, provide assistance with transfer out of bed and ambulation.   ROI form signed to patient and faxed to [facility] (medical records dept fax number [phone]). Specify fax to have information from Pain Management clinic of [facility].    Earlyne Iba, RN

## 2022-10-25 ENCOUNTER — Ambulatory Visit (HOSPITAL_BASED_OUTPATIENT_CLINIC_OR_DEPARTMENT_OTHER): Payer: Medicaid Other | Attending: Primary Care | Admitting: Pulmonary Disease

## 2022-11-07 ENCOUNTER — Ambulatory Visit: Payer: Medicaid Other | Admitting: Physician Assistant

## 2022-11-15 ENCOUNTER — Other Ambulatory Visit (HOSPITAL_COMMUNITY): Payer: Self-pay

## 2023-03-02 ENCOUNTER — Encounter (INDEPENDENT_AMBULATORY_CARE_PROVIDER_SITE_OTHER): Payer: Medicaid Other | Admitting: Primary Care

## 2023-03-09 ENCOUNTER — Encounter (INDEPENDENT_AMBULATORY_CARE_PROVIDER_SITE_OTHER): Payer: Medicaid Other | Admitting: Primary Care

## 2023-03-29 ENCOUNTER — Encounter: Payer: Self-pay | Admitting: Physician Assistant

## 2023-03-29 ENCOUNTER — Other Ambulatory Visit: Payer: Self-pay | Admitting: Critical Care Medicine

## 2023-03-29 DIAGNOSIS — G894 Chronic pain syndrome: Secondary | ICD-10-CM

## 2023-03-29 DIAGNOSIS — M1611 Unilateral primary osteoarthritis, right hip: Secondary | ICD-10-CM

## 2023-03-29 DIAGNOSIS — M5417 Radiculopathy, lumbosacral region: Secondary | ICD-10-CM

## 2023-03-29 DIAGNOSIS — M47812 Spondylosis without myelopathy or radiculopathy, cervical region: Secondary | ICD-10-CM

## 2023-03-29 NOTE — Progress Notes (Cosign Needed)
Pt seen by Dr Delford Field.  Pt has OA, R-THR,  back surgery (taking out part of his spine) for spinal stenosis.   Since then, has been having chronic MS pain.   Also has PTSD, bipolar, disassociation disorder, OSA, morbid obesity.  No HTN, DM.   Has been homeless since March, then w/ friend in [place] >> shelter in [place]>> supposed to stay w/ someone but that did not work out >> GUM.  Was going to a pain mgt clinic in [place], has not seen them recently. Has been off opiates x 3 months. PW to refer to local pain clinic and orthopedics.   Updated demographics, changing address and phone.   [place] is poor, says be dropped his bridge and broke it. He has had it about 3 years.   He wears size 13 shoes, we don't have in stock, socks are in bad shape >> replaced. Will need wide shoes  His cane is gone, was given another one. Wt limit on it is 300 lbs, he will see how it works.   Has LE edema, that started when he first got here, the swelling has increased, they itch now. They should go down at night. This is his first day here.   He was given Voltaren gel, tylenol and ibuprofen for the pain.   It took him 6 hr wait to be seen and get a bed.  The extra standing and walking has aggravated his chronic pain.     03/29/2023    4:16 PM 10/24/2022    8:55 AM 06/29/2022    3:10 PM  Vitals with BMI  Height  6\' 2"  6\' 2"   Weight 348 lbs 6 oz 329 lbs 322 lbs  BMI  42.22 41.32  Systolic 120 135 403  Diastolic 84 95 87  Pulse 107 90 89   Blake Ozer, PA-C 03/29/2023 4:29 PM   Addition to above plan will be to get him back into primary care at community [facility] and then also get him referrals to orthopedic surgery for his spine disease and then as well referral to pain clinic

## 2023-03-31 ENCOUNTER — Ambulatory Visit (INDEPENDENT_AMBULATORY_CARE_PROVIDER_SITE_OTHER): Payer: Medicaid Other | Admitting: Primary Care

## 2023-03-31 ENCOUNTER — Encounter (INDEPENDENT_AMBULATORY_CARE_PROVIDER_SITE_OTHER): Payer: Self-pay | Admitting: Primary Care

## 2023-03-31 VITALS — BP 140/90 | HR 84 | Resp 16 | Ht 74.0 in | Wt 360.4 lb

## 2023-03-31 DIAGNOSIS — D170 Benign lipomatous neoplasm of skin and subcutaneous tissue of head, face and neck: Secondary | ICD-10-CM

## 2023-03-31 DIAGNOSIS — F489 Nonpsychotic mental disorder, unspecified: Secondary | ICD-10-CM

## 2023-03-31 DIAGNOSIS — Z1211 Encounter for screening for malignant neoplasm of colon: Secondary | ICD-10-CM | POA: Diagnosis not present

## 2023-03-31 DIAGNOSIS — Z0000 Encounter for general adult medical examination without abnormal findings: Secondary | ICD-10-CM

## 2023-03-31 DIAGNOSIS — R600 Localized edema: Secondary | ICD-10-CM

## 2023-03-31 DIAGNOSIS — R002 Palpitations: Secondary | ICD-10-CM

## 2023-03-31 DIAGNOSIS — I10 Essential (primary) hypertension: Secondary | ICD-10-CM

## 2023-03-31 MED ORDER — AMLODIPINE BESYLATE 5 MG PO TABS
5.0000 mg | ORAL_TABLET | Freq: Every day | ORAL | 1 refills | Status: AC
Start: 2023-03-31 — End: ?

## 2023-03-31 NOTE — Patient Instructions (Addendum)
Please keep leg elevated when resting. Using compression hose is highly recommended and should be worn within 1 hour of waking from bed. Avoid sitting for prolonged time. Recliner will not help. Try to lay your legs flat on a sofa or on a bed. Avoid excess salty food. Exercise regularly.  Calorie Counting for Weight Loss Calories are units of energy. Your body needs a certain number of calories from food to keep going throughout the day. When you eat or drink more calories than your body needs, your body stores the extra calories mostly as fat. When you eat or drink fewer calories than your body needs, your body burns fat to get the energy it needs. Calorie counting means keeping track of how many calories you eat and drink each day. Calorie counting can be helpful if you need to lose weight. If you eat fewer calories than your body needs, you should lose weight. Ask your health care provider what a healthy weight is for you. For calorie counting to work, you will need to eat the right number of calories each day to lose a healthy amount of weight per week. A dietitian can help you figure out how many calories you need in a day and will suggest ways to reach your calorie goal. A healthy amount of weight to lose each week is usually 1-2 lb (0.5-0.9 kg). This usually means that your daily calorie intake should be reduced by 500-750 calories. Eating 1,200-1,500 calories a day can help most women lose weight. Eating 1,500-1,800 calories a day can help most men lose weight. What do I need to know about calorie counting? Work with your health care provider or dietitian to determine how many calories you should get each day. To meet your daily calorie goal, you will need to: Find out how many calories are in each food that you would like to eat. Try to do this before you eat. Decide how much of the food you plan to eat. Keep a food log. Do this by writing down what you ate and how many calories it had. To  successfully lose weight, it is important to balance calorie counting with a healthy lifestyle that includes regular activity. Where do I find calorie information?  The number of calories in a food can be found on a Nutrition Facts label. If a food does not have a Nutrition Facts label, try to look up the calories online or ask your dietitian for help. Remember that calories are listed per serving. If you choose to have more than one serving of a food, you will have to multiply the calories per serving by the number of servings you plan to eat. For example, the label on a package of bread might say that a serving size is 1 slice and that there are 90 calories in a serving. If you eat 1 slice, you will have eaten 90 calories. If you eat 2 slices, you will have eaten 180 calories. How do I keep a food log? After each time that you eat, record the following in your food log as soon as possible: What you ate. Be sure to include toppings, sauces, and other extras on the food. How much you ate. This can be measured in cups, ounces, or number of items. How many calories were in each food and drink. The total number of calories in the food you ate. Keep your food log near you, such as in a pocket-sized notebook or on an app or website  on your mobile phone. Some programs will calculate calories for you and show you how many calories you have left to meet your daily goal. What are some portion-control tips? Know how many calories are in a serving. This will help you know how many servings you can have of a certain food. Use a measuring cup to measure serving sizes. You could also try weighing out portions on a kitchen scale. With time, you will be able to estimate serving sizes for some foods. Take time to put servings of different foods on your favorite plates or in your favorite bowls and cups so you know what a serving looks like. Try not to eat straight from a food's packaging, such as from a bag or box.  Eating straight from the package makes it hard to see how much you are eating and can lead to overeating. Put the amount you would like to eat in a cup or on a plate to make sure you are eating the right portion. Use smaller plates, glasses, and bowls for smaller portions and to prevent overeating. Try not to multitask. For example, avoid watching TV or using your computer while eating. If it is time to eat, sit down at a table and enjoy your food. This will help you recognize when you are full. It will also help you be more mindful of what and how much you are eating. What are tips for following this plan? Reading food labels Check the calorie count compared with the serving size. The serving size may be smaller than what you are used to eating. Check the source of the calories. Try to choose foods that are high in protein, fiber, and vitamins, and low in saturated fat, trans fat, and sodium. Shopping Read nutrition labels while you shop. This will help you make healthy decisions about which foods to buy. Pay attention to nutrition labels for low-fat or fat-free foods. These foods sometimes have the same number of calories or more calories than the full-fat versions. They also often have added sugar, starch, or salt to make up for flavor that was removed with the fat. Make a grocery list of lower-calorie foods and stick to it. Cooking Try to cook your favorite foods in a healthier way. For example, try baking instead of frying. Use low-fat dairy products. Meal planning Use more fruits and vegetables. One-half of your plate should be fruits and vegetables. Include lean proteins, such as chicken, [place], and fish. Lifestyle Each week, aim to do one of the following: 150 minutes of moderate exercise, such as walking. 75 minutes of vigorous exercise, such as running. General information Know how many calories are in the foods you eat most often. This will help you calculate calorie counts  faster. Find a way of tracking calories that works for you. Get creative. Try different apps or programs if writing down calories does not work for you. What foods should I eat?  Eat nutritious foods. It is better to have a nutritious, high-calorie food, such as an avocado, than a food with few nutrients, such as a bag of potato chips. Use your calories on foods and drinks that will fill you up and will not leave you hungry soon after eating. Examples of foods that fill you up are nuts and nut butters, vegetables, lean proteins, and high-fiber foods such as whole grains. High-fiber foods are foods with more than 5 g of fiber per serving. Pay attention to calories in drinks. Low-calorie drinks include water and  unsweetened drinks. The items listed above may not be a complete list of foods and beverages you can eat. Contact a dietitian for more information. What foods should I limit? Limit foods or drinks that are not good sources of vitamins, minerals, or protein or that are high in unhealthy fats. These include: Candy. Other sweets. Sodas, specialty coffee drinks, alcohol, and juice. The items listed above may not be a complete list of foods and beverages you should avoid. Contact a dietitian for more information. How do I count calories when eating out? Pay attention to portions. Often, portions are much larger when eating out. Try these tips to keep portions smaller: Consider sharing a meal instead of getting your own. If you get your own meal, eat only half of it. Before you start eating, ask for a container and put half of your meal into it. When available, consider ordering smaller portions from the menu instead of full portions. Pay attention to your food and drink choices. Knowing the way food is cooked and what is included with the meal can help you eat fewer calories. If calories are listed on the menu, choose the lower-calorie options. Choose dishes that include vegetables, fruits,  whole grains, low-fat dairy products, and lean proteins. Choose items that are boiled, broiled, grilled, or steamed. Avoid items that are buttered, battered, fried, or served with cream sauce. Items labeled as crispy are usually fried, unless stated otherwise. Choose water, low-fat milk, unsweetened iced tea, or other drinks without added sugar. If you want an alcoholic beverage, choose a lower-calorie option, such as a glass of wine or light beer. Ask for dressings, sauces, and syrups on the side. These are usually high in calories, so you should limit the amount you eat. If you want a salad, choose a garden salad and ask for grilled meats. Avoid extra toppings such as bacon, cheese, or fried items. Ask for the dressing on the side, or ask for olive oil and vinegar or lemon to use as dressing. Estimate how many servings of a food you are given. Knowing serving sizes will help you be aware of how much food you are eating at restaurants. Where to find more information Centers for Disease Control and Prevention: FootballExhibition.com.br U.S. Department of Agriculture: WrestlingReporter.dk Summary Calorie counting means keeping track of how many calories you eat and drink each day. If you eat fewer calories than your body needs, you should lose weight. A healthy amount of weight to lose per week is usually 1-2 lb (0.5-0.9 kg). This usually means reducing your daily calorie intake by 500-750 calories. The number of calories in a food can be found on a Nutrition Facts label. If a food does not have a Nutrition Facts label, try to look up the calories online or ask your dietitian for help. Use smaller plates, glasses, and bowls for smaller portions and to prevent overeating. Use your calories on foods and drinks that will fill you up and not leave you hungry shortly after a meal. This information is not intended to replace advice given to you by your health care provider. Make sure you discuss any questions you have with your  health care provider. Document Revised: 08/08/2019 Document Reviewed: 08/08/2019 Elsevier Patient Education  2023 [employer].

## 2023-03-31 NOTE — Progress Notes (Signed)
[facility]  Blake [name] is a 52 y.o. male presents to office today for annual physical exam examination.    Concerns today include: 1. Swollen area over left eye  Occupation: none, Marital status: S, Substance use: N Diet: None, Exercise: walking  Health Maintenance  Topic Date Due   Zoster Vaccines- Shingrix (1 of 2) Never done   Colonoscopy  Never done   COVID-19 Vaccine (2 - Pfizer risk series) 03/11/2020   INFLUENZA VACCINE  02/09/2023   DTaP/Tdap/Td (4 - Td or Tdap) 07/13/2029   Hepatitis C Screening  Completed   HIV Screening  Completed   HPV VACCINES  Aged Out     Past Medical History:  Diagnosis Date   Arthritis    Back pain    Dilated aortic root (HCC)    39mm ascending aorta by echo 09/2017, normal on echo 08/2018   ERECTILE DYSFUNCTION, MILD 11/12/2007   Qualifier: Diagnosis of  By: Blake [name]     Head injury with loss of consciousness (HCC) 1997   unconsciousness   Heart murmur    as a child   History of blood transfusion    1997, received 15 units of blood    Morbid obesity (HCC)    Neck pain    PVC's (premature ventricular contractions)    TOBACCO ABUSE 11/12/2007   Qualifier: Diagnosis of  By: Blake [name]     Tremor    head and hands , controlled on cymbalta   Social History   Socioeconomic History   Marital status: Single    Spouse name: Not on file   Number of children: 3   Years of education: some college   Highest education level: Not on file  Occupational History   Occupation: waiting on disability  Tobacco Use   Smoking status: Every Day    Current packs/day: 0.50    Average packs/day: 0.5 packs/day for 33.0 years (16.5 ttl pk-yrs)    Types: Cigarettes   Smokeless tobacco: Never   Tobacco comments:    1/2 ppd 03/15/22  Vaping Use   Vaping status: Never Used  Substance and Sexual Activity   Alcohol use: Yes    Alcohol/week: 1.0 standard drink of alcohol    Types: 1 Cans of beer per week   Drug use: No   Sexual  activity: Not Currently  Other Topics Concern   Not on file  Social History Narrative   Lives at home alone.   Right-handed.   No daily caffeine use.   Social Determinants of Health   Financial Resource Strain: Not on file  Food Insecurity: Not on file  Transportation Needs: Not on file  Physical Activity: Not on file  Stress: Not on file  Social Connections: Not on file  Intimate Partner Violence: Not on file   Past Surgical History:  Procedure Laterality Date   FACIAL FRACTURE SURGERY  09/1995   left side of face crush injury, carnial fracture    LUMBAR LAMINECTOMY/DECOMPRESSION MICRODISCECTOMY N/A 11/25/2019   Procedure: BILATERAL PARTIAL LUMBAR HEMILAMINECTOMIES LUMBAR FOUR-FIVE;  Surgeon: Blake [name];  Location: [facility] OR;  Service: Orthopedics;  Laterality: N/A;   TOTAL HIP ARTHROPLASTY Right 09/07/2018   Procedure: RIGHT TOTAL HIP ARTHROPLASTY ANTERIOR APPROACH;  Surgeon: Blake [name];  Location: [facility];  Service: Orthopedics;  Laterality: Right;   Family History  Problem Relation Age of Onset   COPD Mother    Leukemia Father    Hypertension Sister  Diabetes Sister     Current Outpatient Medications:    acetaminophen (TYLENOL) 500 MG tablet, Take 500 mg by mouth every 6 (six) hours as needed., Disp: , Rfl:    aspirin EC 81 MG tablet, Take 1 tablet (81 mg total) by mouth daily., Disp: 90 tablet, Rfl: 3   diclofenac (VOLTAREN) 50 MG EC tablet, Take 50 mg by mouth 2 (two) times daily., Disp: , Rfl:    sildenafil (VIAGRA) 100 MG tablet, Take 0.5-1 tablets (50-100 mg total) by mouth daily as needed for erectile dysfunction., Disp: 10 tablet, Rfl: 3   nystatin cream (MYCOSTATIN), Apply topically 2 (two) times daily. (Patient not taking: Reported on 03/31/2023), Disp: 90 g, Rfl: 1 Outpatient Encounter Medications as of 03/31/2023  Medication Sig   acetaminophen (TYLENOL) 500 MG tablet Take 500 mg by mouth every 6 (six) hours as needed.   aspirin EC 81 MG  tablet Take 1 tablet (81 mg total) by mouth daily.   diclofenac (VOLTAREN) 50 MG EC tablet Take 50 mg by mouth 2 (two) times daily.   sildenafil (VIAGRA) 100 MG tablet Take 0.5-1 tablets (50-100 mg total) by mouth daily as needed for erectile dysfunction.   nystatin cream (MYCOSTATIN) Apply topically 2 (two) times daily. (Patient not taking: Reported on 03/31/2023)   No facility-administered encounter medications on file as of 03/31/2023.    No Known Allergies   ROS: Review of Systems Pertinent items noted in HPI and remainder of comprehensive ROS otherwise negative.    Physical exam BP 131/87   Pulse 84   Resp 16   Ht 6\' 2"  (1.88 m)   Wt (!) 360 lb 6.4 oz (163.5 kg)   SpO2 98%   BMI 46.27 kg/m  General appearance: alert, appears stated age, and moderately obese Head: Normocephalic, without obvious abnormality, atraumatic Eyes: conjunctivae/corneas clear. PERRL, EOM's intact. Fundi benign. Neck: no adenopathy, no carotid bruit, no JVD, supple, symmetrical, trachea midline, and thyroid not enlarged, symmetric, no tenderness/mass/nodules Lungs: clear to auscultation bilaterally Abdomen: soft, non-tender; bowel sounds normal; no masses,  no organomegaly Skin: Skin color, texture, turgor normal. No rashes or lesions Lymph nodes: Cervical, supraclavicular, and axillary nodes normal. Neurologic: Alert and oriented X 3, normal strength and tone. Normal symmetric reflexes. Normal coordination and gait    Assessment/ Plan:  [name] was seen today for annual exam.  Diagnoses and all orders for this visit:  Annual physical exam  Colon cancer screening -     Ambulatory referral to Gastroenterology  Lipoma of face   Morbidly obese (HCC) Morbid Obesity is  > 40  indicating an excess in caloric intake or underlining conditions. This may lead to other co-morbidities. Educated on lifestyle modifications of diet and exercise which may reduce obesity.    Mental health problem -      Ambulatory referral to Psychiatry  Palpitations -     Ambulatory referral to Cardiology  Essential hypertension BP goal - < 130/80 (note ate a bag of cheese doodle unaware on my Bp reading) not to mention 3 egg and sausage biscuits -( homeless also plays a factor in nutrition) Explained that having normal blood pressure is the goal and medications are helping to get to goal and maintain normal blood pressure. DIET: Limit salt intake, read nutrition labels to check salt content, limit fried and high fatty foods  Avoid using multisymptom OTC cold preparations that generally contain sudafed which can rise BP. Consult with pharmacist on best cold relief products to use for  persons with HTN EXERCISE Discussed incorporating exercise such as walking - 30 minutes most days of the week and can do in 10 minute intervals    -     Ambulatory referral to Cardiology  Bilateral leg edema Please keep leg elevated when resting. Using compression hose is highly recommended and should be worn within 1 hour of waking from bed. Avoid sitting for prolonged time. Recliner will not help. Try to lay your legs flat on a sofa or on a bed. Avoid excess salty food. Exercise regularly.  Ambulatory referral to Cardiology  Counseled on healthy lifestyle choices, including diet (rich in fruits, vegetables and lean meats and low in salt and simple carbohydrates) and exercise (at least 30 minutes of moderate physical activity daily).  Patient to follow up in 1 year for annual exam or sooner if needed.  The above assessment and management plan was discussed with the patient. The patient verbalized understanding of and has agreed to the management plan. Patient is aware to call the clinic if symptoms persist or worsen. Patient is aware when to return to the clinic for a follow-up visit. Patient educated on when it is appropriate to go to the emergency department.   This note has been created with Furniture conservator/restorer. Any transcriptional errors are unintentional.   Grayce Sessions, NP 03/31/2023, 11:49 AM

## 2023-04-05 ENCOUNTER — Ambulatory Visit (INDEPENDENT_AMBULATORY_CARE_PROVIDER_SITE_OTHER): Payer: Self-pay | Admitting: Physician Assistant

## 2023-04-05 ENCOUNTER — Encounter (HOSPITAL_COMMUNITY): Payer: Self-pay | Admitting: Physician Assistant

## 2023-04-05 VITALS — BP 140/94 | HR 82 | Ht 74.0 in | Wt 357.2 lb

## 2023-04-05 DIAGNOSIS — F411 Generalized anxiety disorder: Secondary | ICD-10-CM | POA: Insufficient documentation

## 2023-04-05 DIAGNOSIS — F4310 Post-traumatic stress disorder, unspecified: Secondary | ICD-10-CM | POA: Diagnosis not present

## 2023-04-05 DIAGNOSIS — F200 Paranoid schizophrenia: Secondary | ICD-10-CM | POA: Insufficient documentation

## 2023-04-05 DIAGNOSIS — G479 Sleep disorder, unspecified: Secondary | ICD-10-CM

## 2023-04-05 MED ORDER — RISPERIDONE 1 MG PO TABS
1.0000 mg | ORAL_TABLET | Freq: Two times a day (BID) | ORAL | 1 refills | Status: AC
Start: 2023-04-05 — End: ?

## 2023-04-05 MED ORDER — TRAZODONE HCL 50 MG PO TABS
50.0000 mg | ORAL_TABLET | Freq: Every day | ORAL | 1 refills | Status: AC
Start: 2023-04-05 — End: ?

## 2023-04-05 MED ORDER — BUSPIRONE HCL 7.5 MG PO TABS
7.5000 mg | ORAL_TABLET | Freq: Two times a day (BID) | ORAL | 1 refills | Status: AC
Start: 2023-04-05 — End: ?
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
## 2023-04-06 ENCOUNTER — Telehealth (HOSPITAL_COMMUNITY): Payer: Self-pay | Admitting: *Deleted

## 2023-04-06 NOTE — Telephone Encounter (Signed)
Fax received for prior authorization of Risperidone. Submitted online with cover my meds. Approved until 04/05/24 [id number]. Notified pharmacy.

## 2023-04-07 ENCOUNTER — Other Ambulatory Visit (INDEPENDENT_AMBULATORY_CARE_PROVIDER_SITE_OTHER): Payer: Medicaid Other

## 2023-04-14 ENCOUNTER — Other Ambulatory Visit (INDEPENDENT_AMBULATORY_CARE_PROVIDER_SITE_OTHER): Payer: Medicaid Other

## 2023-04-27 ENCOUNTER — Telehealth (INDEPENDENT_AMBULATORY_CARE_PROVIDER_SITE_OTHER): Payer: Self-pay | Admitting: Primary Care

## 2023-04-27 NOTE — Telephone Encounter (Signed)
Dois Davenport calling from [facility] is calling to report that she will be fax a form for personal care services for the patient. CB- [phone]- secure direct line [phone]

## 2023-04-28 NOTE — Telephone Encounter (Signed)
Form has been received.

## 2023-05-03 ENCOUNTER — Ambulatory Visit: Payer: Medicaid Other | Admitting: Orthopedic Surgery

## 2023-05-09 NOTE — Telephone Encounter (Signed)
Dois Davenport with [facility] is calling back to see when the form will be faxed back to her so that the pt can start getting personal care services. Please call Dois Davenport back.   CB- [phone]- secure direct line Fax-[phone]

## 2023-05-09 NOTE — Telephone Encounter (Signed)
Yes it has been emailed to you.

## 2023-05-09 NOTE — Telephone Encounter (Signed)
Blake [name] was form completed

## 2023-05-15 NOTE — Progress Notes (Deleted)
Cardiology Office Note:  .   Date:  05/15/2023  ID:  Blake [name], DOB [birth year], MRN [id number] PCP: Grayce Sessions, NP  [facility] Providers Cardiologist:  Armanda Magic, MD {  History of Present Illness: .   Blake [name] is a 52 y.o. male with a past medical history of morbid obesity, tobacco abuse, ED, tremor, PVCs, mildly dilated aorta here for follow-up appointment.  He was last seen during a televisit in 2022.  History includes event monitor 08/10/2017 showing normal sinus rhythm with occasional PVCs.  ETT was ordered 08/10/2017 which was negative (mildly impaired exercise tolerance) he, rare PVCs noted with exercise.  2D echo 08/2018 showed moderate LVH, EF 60 to 65% with normal aortic root.  He was having some atypical chest pain at that time and Lexiscan Myoview was ordered and was normal.  When seen during his video follow-up appointment he denied exertional chest pain.  Was not having any SOB, DOE, PND, orthopnea, lower extremity edema, dizziness, or syncope.  Occasionally will feel skipped heartbeat here and there.  Compliant with medications.  Doing well with CPAP device and thinks he has gotten used to it but stopped 1 month prior because he was having problems with his mask.  Tolerates the mask but it is 39 months old and is leaking.  He felt like he needed a mask fitting.  Overall, pressure was adequate.  Today, he***  ROS: ***  Studies Reviewed: .        2D echo 08/30/2018 IMPRESSIONS     1. The left ventricle has normal systolic function with an ejection  fraction of 60-65%. The cavity size was normal. There is moderately  increased left ventricular wall thickness. Left ventricular diastolic  parameters were normal.   2. The right ventricle has normal systolic function. The cavity was  normal. There is no increase in right ventricular wall thickness.   3. Right atrial size was mildly dilated.   4. The mitral valve is normal in structure.   5. The  tricuspid valve is normal in structure.   6. The aortic valve is normal in structure.   7. The aortic root and ascending aorta are normal in size and structure.   8. The interatrial septum was not assessed.    Nuclear Stress Test 06/2019 Study Highlights   Nuclear stress EF: 53%. The left ventricular ejection fraction is mildly decreased (45-54%). There was no ST segment deviation noted during stress. No T wave inversion was noted during stress. Defect 1: There is a medium defect of moderate severity present in the basal inferior, mid inferior and apical inferior location. Findings consistent with inferior ischemia. This pattern may also be seen in diaphragmatic attenuation. This is an intermediate risk study.  Risk Assessment/Calculations:   {Does this patient have ATRIAL FIBRILLATION?:[phone]} No BP recorded.  {Refresh Note OR Click here to enter BP  :1}***       Physical Exam:   VS:  There were no vitals taken for this visit.   Wt Readings from Last 3 Encounters:  03/31/23 (!) 360 lb 6.4 oz (163.5 kg)  03/29/23 (!) 348 lb 6.4 oz (158 kg)  10/24/22 (!) 329 lb (149.2 kg)    GEN: Well nourished, well developed in no acute distress NECK: No JVD; No carotid bruits CARDIAC: ***RRR, no murmurs, rubs, gallops RESPIRATORY:  Clear to auscultation without rales, wheezing or rhonchi  ABDOMEN: Soft, non-tender, non-distended EXTREMITIES:  No edema; No deformity   ASSESSMENT AND PLAN: .  1.  PVCs 2.  Obesity 3.  CAD 4.  OSA 5.  HLD    {Are you ordering a CV Procedure (e.g. stress test, cath, DCCV, TEE, etc)?   Press F2        :[id number]}  Dispo: ***  Signed, Sharlene Dory, PA-C

## 2023-05-16 ENCOUNTER — Ambulatory Visit: Payer: Medicaid Other | Attending: Physician Assistant | Admitting: Physician Assistant

## 2023-05-16 DIAGNOSIS — I2510 Atherosclerotic heart disease of native coronary artery without angina pectoris: Secondary | ICD-10-CM

## 2023-05-16 DIAGNOSIS — E785 Hyperlipidemia, unspecified: Secondary | ICD-10-CM

## 2023-05-16 DIAGNOSIS — I493 Ventricular premature depolarization: Secondary | ICD-10-CM

## 2023-05-16 DIAGNOSIS — G4733 Obstructive sleep apnea (adult) (pediatric): Secondary | ICD-10-CM

## 2023-05-17 ENCOUNTER — Encounter (HOSPITAL_COMMUNITY): Payer: Medicaid Other | Admitting: Physician Assistant

## 2023-05-17 ENCOUNTER — Encounter: Payer: Self-pay | Admitting: Physician Assistant

## 2023-06-29 ENCOUNTER — Telehealth (HOSPITAL_COMMUNITY): Payer: Self-pay | Admitting: *Deleted

## 2023-06-29 ENCOUNTER — Telehealth: Payer: Self-pay

## 2023-06-29 ENCOUNTER — Telehealth (INDEPENDENT_AMBULATORY_CARE_PROVIDER_SITE_OTHER): Payer: Self-pay

## 2023-06-29 NOTE — Telephone Encounter (Signed)
Noted  

## 2023-06-29 NOTE — Telephone Encounter (Signed)
Would I be able to get a copy emailed if possible

## 2023-06-29 NOTE — Telephone Encounter (Signed)
Elsie Lincoln, RN has addressed this

## 2023-06-29 NOTE — Telephone Encounter (Signed)
Please call the pt when form is complete.  He would like to p/u a copy of the form once completed.

## 2023-06-29 NOTE — Telephone Encounter (Signed)
Called pt to make him aware that the form was sent to our case manager. It will be faxed once completed.

## 2023-06-29 NOTE — Telephone Encounter (Signed)
Please reach out to pt and make aware that form was received and has been sent to our case manager to get filled out. Once it's done being filled out it will be faxed to the appropriate place

## 2023-06-29 NOTE — Telephone Encounter (Signed)
Copied from CRM [id number]. Topic: General - Inquiry >> Jun 29, 2023 10:34 AM De Blanch wrote: Reason for CRM: Pt is calling to ask if the office has received paperwork from his [facility] nurse; stated this was faxed to the office 12/13 at 11:55.  Please advise.

## 2023-06-29 NOTE — Telephone Encounter (Signed)
Fax received for prior authorization of Risperdal. Submitted online with cover my meds. Awaiting decision.

## 2023-06-29 NOTE — Telephone Encounter (Signed)
PCS form emailed to [employer] .

## 2023-06-30 ENCOUNTER — Telehealth (HOSPITAL_COMMUNITY): Payer: Self-pay | Admitting: *Deleted

## 2023-06-30 ENCOUNTER — Ambulatory Visit (INDEPENDENT_AMBULATORY_CARE_PROVIDER_SITE_OTHER): Payer: Medicaid Other | Admitting: Primary Care

## 2023-06-30 NOTE — Telephone Encounter (Signed)
Noted, thank you

## 2023-06-30 NOTE — Telephone Encounter (Signed)
Fax received for approval of Risperidone 1mg  until 06/28/24. Pharmacy notified.

## 2023-06-30 NOTE — Telephone Encounter (Signed)
FL2 Emailed to [employer]

## 2023-06-30 NOTE — Telephone Encounter (Signed)
Pt has an appt today 12/20 please give pt copy of paperwork. Paperwork has been placed up front

## 2023-06-30 NOTE — Telephone Encounter (Signed)
Looked online with cover my meds. Risperidone approved. Pharmacy notified.

## 2023-08-07 ENCOUNTER — Ambulatory Visit (INDEPENDENT_AMBULATORY_CARE_PROVIDER_SITE_OTHER): Payer: MEDICAID | Admitting: Primary Care

## 2023-08-07 ENCOUNTER — Encounter (INDEPENDENT_AMBULATORY_CARE_PROVIDER_SITE_OTHER): Payer: Self-pay | Admitting: Primary Care

## 2023-08-07 VITALS — BP 127/90 | HR 77 | Resp 16 | Ht 74.0 in | Wt 368.0 lb

## 2023-08-07 DIAGNOSIS — Z0130 Encounter for examination of blood pressure without abnormal findings: Secondary | ICD-10-CM

## 2023-08-07 DIAGNOSIS — Z833 Family history of diabetes mellitus: Secondary | ICD-10-CM

## 2023-08-07 DIAGNOSIS — G4733 Obstructive sleep apnea (adult) (pediatric): Secondary | ICD-10-CM | POA: Diagnosis not present

## 2023-08-07 DIAGNOSIS — L309 Dermatitis, unspecified: Secondary | ICD-10-CM

## 2023-08-07 DIAGNOSIS — F17210 Nicotine dependence, cigarettes, uncomplicated: Secondary | ICD-10-CM | POA: Diagnosis not present

## 2023-08-07 DIAGNOSIS — Z2821 Immunization not carried out because of patient refusal: Secondary | ICD-10-CM

## 2023-08-07 DIAGNOSIS — E66813 Obesity, class 3: Secondary | ICD-10-CM

## 2023-08-07 DIAGNOSIS — E669 Obesity, unspecified: Secondary | ICD-10-CM

## 2023-08-07 DIAGNOSIS — Z13220 Encounter for screening for lipoid disorders: Secondary | ICD-10-CM | POA: Diagnosis not present

## 2023-08-07 DIAGNOSIS — I10 Essential (primary) hypertension: Secondary | ICD-10-CM

## 2023-08-07 DIAGNOSIS — Z6841 Body Mass Index (BMI) 40.0 and over, adult: Secondary | ICD-10-CM

## 2023-08-07 DIAGNOSIS — Z720 Tobacco use: Secondary | ICD-10-CM

## 2023-08-07 MED ORDER — TRIAMCINOLONE ACETONIDE 0.5 % EX OINT: 1.0000 | TOPICAL_OINTMENT | Freq: Two times a day (BID) | CUTANEOUS | 0 refills | Status: AC

## 2023-08-07 NOTE — Progress Notes (Signed)
[facility]  Blake [name], is a 53 y.o. male  ZOX:[id number]  [id number]:[id number]  DOB - 1971-02-21  Chief Complaint  Patient presents with   Eczema   Hypertension       Subjective:   Blake [name] is a 53 y.o. male here today for a follow up visit. Patient has No headache, No chest pain, No abdominal pain - No Nausea, No new weakness tingling or numbness, No Cough - shortness of breath Hypertension This is a chronic problem. The current episode started more than 1 year ago. The problem has been gradually improving since onset. The problem is controlled (115-130 systolic 65- 84). Agents associated with hypertension include NSAIDs. Risk factors for coronary artery disease include male gender and obesity. Past treatments include calcium channel blockers. The current treatment provides moderate improvement. Compliance problems include exercise.  Identifiable causes of hypertension include sleep apnea.    No problems updated.  Comprehensive ROS Pertinent positive and negative noted in HPI   No Known Allergies  Past Medical History:  Diagnosis Date   Arthritis    Back pain    Dilated aortic root (HCC)    39mm ascending aorta by echo 09/2017, normal on echo 08/2018   ERECTILE DYSFUNCTION, MILD 11/12/2007   Qualifier: Diagnosis of  By: Alphia Kava     Head injury with loss of consciousness (HCC) 1997   unconsciousness   Heart murmur    as a child   History of blood transfusion    1997, received 15 units of blood    Morbid obesity (HCC)    Neck pain    PVC's (premature ventricular contractions)    TOBACCO ABUSE 11/12/2007   Qualifier: Diagnosis of  By: Deirdre Peer MD, Erin     Tremor    head and hands , controlled on cymbalta    Current Outpatient Medications on File Prior to Visit  Medication Sig Dispense Refill   acetaminophen (TYLENOL) 500 MG tablet Take 500 mg by mouth every 6 (six) hours as needed.     amLODipine (NORVASC) 5 MG tablet Take 1 tablet (5 mg total) by  mouth daily. 90 tablet 1   aspirin EC 81 MG tablet Take 1 tablet (81 mg total) by mouth daily. 90 tablet 3   busPIRone (BUSPAR) 7.5 MG tablet Take 1 tablet (7.5 mg total) by mouth 2 (two) times daily. 60 tablet 1   diclofenac (VOLTAREN) 50 MG EC tablet Take 50 mg by mouth 2 (two) times daily.     nystatin cream (MYCOSTATIN) Apply topically 2 (two) times daily. (Patient not taking: Reported on 03/31/2023) 90 g 1   risperiDONE (RISPERDAL) 1 MG tablet Take 1 tablet (1 mg total) by mouth 2 (two) times daily. 60 tablet 1   sildenafil (VIAGRA) 100 MG tablet Take 0.5-1 tablets (50-100 mg total) by mouth daily as needed for erectile dysfunction. 10 tablet 3   traZODone (DESYREL) 50 MG tablet Take 1 tablet (50 mg total) by mouth at bedtime. 30 tablet 1   No current facility-administered medications on file prior to visit.   Health Maintenance  Topic Date Due   Colon Cancer Screening  Never done   COVID-19 Vaccine (2 - Pfizer risk series) 03/11/2020   Flu Shot  10/09/2023*   Zoster (Shingles) Vaccine (1 of 2) 11/05/2023*   Pneumococcal Vaccination (1 of 2 - PCV) 08/06/2024*   DTaP/Tdap/Td vaccine (4 - Td or Tdap) 07/13/2029   Hepatitis C Screening  Completed   HIV Screening  Completed  HPV Vaccine  Aged Out  *Topic was postponed. The date shown is not the original due date.    Objective:   Vitals:   08/07/23 1056 08/07/23 1058  BP: (!) 128/90 (!) 127/90  Pulse: 77   Resp: 16   SpO2: 99%   Weight: (!) 368 lb (166.9 kg)   Height: 6\' 2"  (1.88 m)    BP Readings from Last 3 Encounters:  08/07/23 (!) 127/90  03/31/23 (!) 140/90  03/29/23 120/84      Physical Exam Vitals reviewed.  Constitutional:      Appearance: He is obese.  HENT:     Head: Normocephalic.     Right Ear: Tympanic membrane and external ear normal.     Left Ear: Tympanic membrane and external ear normal.     Nose: Nose normal.  Eyes:     Extraocular Movements: Extraocular movements intact.  Cardiovascular:      Rate and Rhythm: Normal rate and regular rhythm.  Pulmonary:     Effort: Pulmonary effort is normal.     Breath sounds: Normal breath sounds.  Abdominal:     General: Abdomen is flat. There is distension.     Palpations: Abdomen is soft.  Musculoskeletal:        General: Normal range of motion.     Cervical back: Normal range of motion and neck supple.  Skin:    General: Skin is warm and dry.  Neurological:     Mental Status: He is oriented to person, place, and time.  Psychiatric:        Mood and Affect: Mood normal.        Behavior: Behavior normal.        Thought Content: Thought content normal.    \  Assessment & Plan  Blake [name] was seen today for eczema and hypertension.  Diagnoses and all orders for this visit:  Pneumococcal vaccination declined  Herpes zoster vaccination declined  Blood pressure check 127/90 diastolic still remains elevated -     CMP14+EGFR  OSA (obstructive sleep apnea) Refer to pulmonology  Tobacco abuse - I have recommended complete cessation of tobacco use. I have discussed various options available for assistance with tobacco cessation including over the counter methods (Nicotine gum, patch and lozenges). We also discussed prescription options (Chantix, Nicotine Inhaler / Nasal Spray). The patient is not interested in pursuing any prescription tobacco cessation options at this time. - Patient declines at this time.  - Less than 5 minutes spent on counseling.   Essential hypertension  Screening for lipid disorders -     CMP14+EGFR -     Lipid panel  Family history of diabetes mellitus in sister -     CBC with Differential/Platelet -     Hemoglobin A1c  Eczema, unspecified type     Eczema, unspecified type  Referred to dermatology and prescribed triamcinolone  Patient have been counseled extensively about nutrition and exercise. Other issues discussed during this visit include: low cholesterol diet, weight control and daily exercise,  foot care, annual eye examinations at Ophthalmology, importance of adherence with medications and regular follow-up. We also discussed long term complications of uncontrolled diabetes and hypertension.   Return in about 3 months (around 11/05/2023) for medical conditions.  The patient was given clear instructions to go to ER or return to medical center if symptoms don't improve, worsen or new problems develop. The patient verbalized understanding. The patient was told to call to get lab results if they haven't heard  anything in the next week.   This note has been created with Education officer, environmental. Any transcriptional errors are unintentional.   Grayce Sessions, NP 08/13/2023, 12:56 AM

## 2023-08-08 ENCOUNTER — Encounter (INDEPENDENT_AMBULATORY_CARE_PROVIDER_SITE_OTHER): Payer: Self-pay | Admitting: Primary Care

## 2023-08-08 LAB — CMP14+EGFR
ALT: 19 [IU]/L (ref 0–44)
AST: 19 [IU]/L (ref 0–40)
Albumin: 4.4 g/dL (ref 3.8–4.9)
Alkaline Phosphatase: 83 [IU]/L (ref 44–121)
BUN/Creatinine Ratio: 11 (ref 9–20)
BUN: 11 mg/dL (ref 6–24)
Bilirubin Total: 0.3 mg/dL (ref 0.0–1.2)
CO2: 22 mmol/L (ref 20–29)
Calcium: 9.4 mg/dL (ref 8.7–10.2)
Chloride: 103 mmol/L (ref 96–106)
Creatinine, Ser: 1.02 mg/dL (ref 0.76–1.27)
Globulin, Total: 2.7 g/dL (ref 1.5–4.5)
Glucose: 70 mg/dL (ref 70–99)
Potassium: 5 mmol/L (ref 3.5–5.2)
Sodium: 141 mmol/L (ref 134–144)
Total Protein: 7.1 g/dL (ref 6.0–8.5)
eGFR: 88 mL/min/{1.73_m2} (ref >59)

## 2023-08-08 LAB — CBC WITH DIFFERENTIAL/PLATELET
Basophils Absolute: 0 10*3/uL (ref 0.0–0.2)
Basos: 0 %
EOS (ABSOLUTE): 0.1 10*3/uL (ref 0.0–0.4)
Eos: 2 %
Hematocrit: 47.7 % (ref 37.5–51.0)
Hemoglobin: 15.8 g/dL (ref 13.0–17.7)
Immature Grans (Abs): 0 10*3/uL (ref 0.0–0.1)
Immature Granulocytes: 0 %
Lymphocytes Absolute: 1.5 10*3/uL (ref 0.7–3.1)
Lymphs: 26 %
MCH: 31 pg (ref 26.6–33.0)
MCHC: 33.1 g/dL (ref 31.5–35.7)
MCV: 94 fL (ref 79–97)
Monocytes Absolute: 0.6 10*3/uL (ref 0.1–0.9)
Monocytes: 11 %
Neutrophils Absolute: 3.6 10*3/uL (ref 1.4–7.0)
Neutrophils: 61 %
Platelets: 239 10*3/uL (ref 150–450)
RBC: 5.09 x10E6/uL (ref 4.14–5.80)
RDW: 14.2 % (ref 11.6–15.4)
WBC: 5.9 10*3/uL (ref 3.4–10.8)

## 2023-08-08 LAB — LIPID PANEL
Chol/HDL Ratio: 3.7 {ratio} (ref 0.0–5.0)
Cholesterol, Total: 171 mg/dL (ref 100–199)
HDL: 46 mg/dL (ref >39)
LDL Chol Calc (NIH): 107 mg/dL — ABNORMAL HIGH (ref 0–99)
Triglycerides: 101 mg/dL (ref 0–149)
VLDL Cholesterol Cal: 18 mg/dL (ref 5–40)

## 2023-08-08 LAB — HEMOGLOBIN A1C
Est. average glucose Bld gHb Est-mCnc: 123 mg/dL
Hgb A1c MFr Bld: 5.9 % — ABNORMAL HIGH (ref 4.8–5.6)

## 2023-08-18 ENCOUNTER — Telehealth: Payer: Self-pay

## 2023-08-18 NOTE — Telephone Encounter (Unsigned)
 Copied from CRM [id number]. Topic: General - Other >> Aug 08, 2023  3:41 PM Lennie Ra H wrote: Reason for CRM: PCs Form for homecare has expired and needs another one

## 2023-08-18 NOTE — Telephone Encounter (Signed)
 New pcs form was faxed on 08/15/23

## 2023-08-18 NOTE — Telephone Encounter (Unsigned)
 Copied from CRM [id number]. Topic: General - Other >> Aug 08, 2023  3:41 PM Blake [name] wrote: Reason for CRM: PCs Form for homecare has expired and needs another one

## 2023-08-18 NOTE — Telephone Encounter (Signed)
 Routing to office

## 2023-08-18 NOTE — Telephone Encounter (Signed)
 Copied from CRM [id number]. Topic: General - Other >> Aug 18, 2023 11:24 AM Juliana Ocean wrote: Reason for CRM: pt following up on request for a PCS form.  His Form for homecare has expired and needs another one . Pt states he needs a hard copy as well.

## 2023-08-21 ENCOUNTER — Telehealth (INDEPENDENT_AMBULATORY_CARE_PROVIDER_SITE_OTHER): Payer: Self-pay | Admitting: Primary Care

## 2023-08-21 NOTE — Telephone Encounter (Signed)
 Pt called and spoke to someone via phone. He does not remember who that person was but pt was told to come in and pick up a copy of paperwork. I went back and spoke with nurse [place]. A copy of paperwor was made and given to the pts family member. Pt signed stating paperwork was received.

## 2023-08-23 NOTE — Telephone Encounter (Signed)
error 

## 2023-10-09 ENCOUNTER — Other Ambulatory Visit (INDEPENDENT_AMBULATORY_CARE_PROVIDER_SITE_OTHER): Payer: Self-pay | Admitting: Primary Care

## 2023-10-09 DIAGNOSIS — N529 Male erectile dysfunction, unspecified: Secondary | ICD-10-CM

## 2023-10-09 NOTE — Telephone Encounter (Signed)
 Copied from CRM [id number]. Topic: Clinical - Medication Refill >> Oct 09, 2023 11:25 AM Everette C wrote: Most Recent Primary Care Visit:  Provider: Grayce Sessions  Department: [facility]  Visit Type: OFFICE VISIT  Date: 08/07/2023  Medication: Rx #: [id number]  sildenafil (VIAGRA) 100 MG tablet [[id number]]    Has the patient contacted their pharmacy? Yes (Agent: If no, request that the patient contact the pharmacy for the refill. If patient does not wish to contact the pharmacy document the reason why and proceed with request.) (Agent: If yes, when and what did the pharmacy advise?)  Is this the correct pharmacy for this prescription? Yes If no, delete pharmacy and type the correct one.  This is the patient's preferred pharmacy:  [facility] [phone] - [place], [place] - [address] AT [facility] [address] [place] [address] Phone: [phone] Fax: [phone]   Has the prescription been filled recently? No  Is the patient out of the medication? Yes  Has the patient been seen for an appointment in the last year OR does the patient have an upcoming appointment? Yes  Can we respond through MyChart? No  Agent: Please be advised that Rx refills may take up to 3 business days. We ask that you follow-up with your pharmacy.

## 2023-10-10 ENCOUNTER — Other Ambulatory Visit (INDEPENDENT_AMBULATORY_CARE_PROVIDER_SITE_OTHER): Payer: Self-pay | Admitting: Primary Care

## 2023-10-10 DIAGNOSIS — N529 Male erectile dysfunction, unspecified: Secondary | ICD-10-CM

## 2023-10-10 NOTE — Telephone Encounter (Signed)
 Copied from CRM [id number]. Topic: Clinical - Medication Refill >> Oct 09, 2023 11:25 AM Everette C wrote: Most Recent Primary Care Visit:  Provider: Grayce Sessions  Department: [facility]  Visit Type: OFFICE VISIT  Date: 08/07/2023  Medication: Rx #: [id number]  sildenafil (VIAGRA) 100 MG tablet [[id number]]    Has the patient contacted their pharmacy? Yes (Agent: If no, request that the patient contact the pharmacy for the refill. If patient does not wish to contact the pharmacy document the reason why and proceed with request.) (Agent: If yes, when and what did the pharmacy advise?)  Is this the correct pharmacy for this prescription? Yes If no, delete pharmacy and type the correct one.  This is the patient's preferred pharmacy:  [facility] [phone] - [place], [place] - [address] AT [facility] [address] [place] [address] Phone: [phone] Fax: [phone]   Has the prescription been filled recently? No  Is the patient out of the medication? Yes  Has the patient been seen for an appointment in the last year OR does the patient have an upcoming appointment? Yes  Can we respond through MyChart? No  Agent: Please be advised that Rx refills may take up to 3 business days. We ask that you follow-up with your pharmacy. >> Oct 10, 2023  1:42 PM Abundio Miu S wrote: Patient calling to check the status of med refill. Advised patient that request has been submitted and it takes up to 3 business days to process.

## 2023-10-11 NOTE — Telephone Encounter (Signed)
 Requested medication (s) are due for refill today: Yes  Requested medication (s) are on the active medication list: Yes  Last refill:  09/02/21  Future visit scheduled: Yes  Notes to clinic:  Prescription has expired.    Requested Prescriptions  Pending Prescriptions Disp Refills   sildenafil (VIAGRA) 100 MG tablet 10 tablet 3    Sig: Take 0.5-1 tablets (50-100 mg total) by mouth daily as needed for erectile dysfunction.     Urology: Erectile Dysfunction Agents Failed - 10/11/2023 11:28 AM      Failed - Last BP in normal range    BP Readings from Last 1 Encounters:  08/07/23 (!) 127/90         Passed - AST in normal range and within 360 days    AST  Date Value Ref Range Status  08/07/2023 19 0 - 40 IU/L Final         Passed - ALT in normal range and within 360 days    ALT  Date Value Ref Range Status  08/07/2023 19 0 - 44 IU/L Final         Passed - Valid encounter within last 12 months    Recent Outpatient Visits           2 months ago Pneumococcal vaccination declined   [place] [facility] Grayce Sessions, NP   6 months ago Annual physical exam   [place] [facility] Grayce Sessions, NP   1 year ago OSA (obstructive sleep apnea)   [place] [facility] Grayce Sessions, NP   2 years ago Candida infection   [place] [facility] Grayce Sessions, NP   2 years ago Colon cancer screening    [facility] Grayce Sessions, NP       Future Appointments             In 3 weeks Randa Evens, Kinnie Scales, NP  [facility]

## 2023-10-11 NOTE — Telephone Encounter (Signed)
 Requested medication (s) are due for refill today: yes  Requested medication (s) are on the active medication list: yes  Last refill:  09/02/21 #10 3 RF  Future visit scheduled: yes  Notes to clinic:  order was written 2 years ago   Requested Prescriptions  Pending Prescriptions Disp Refills   sildenafil (VIAGRA) 100 MG tablet 10 tablet 3    Sig: Take 0.5-1 tablets (50-100 mg total) by mouth daily as needed for erectile dysfunction.     Urology: Erectile Dysfunction Agents Failed - 10/11/2023 11:16 AM      Failed - Last BP in normal range    BP Readings from Last 1 Encounters:  08/07/23 (!) 127/90         Passed - AST in normal range and within 360 days    AST  Date Value Ref Range Status  08/07/2023 19 0 - 40 IU/L Final         Passed - ALT in normal range and within 360 days    ALT  Date Value Ref Range Status  08/07/2023 19 0 - 44 IU/L Final         Passed - Valid encounter within last 12 months    Recent Outpatient Visits           2 months ago Pneumococcal vaccination declined   [place] [facility] Grayce Sessions, NP   6 months ago Annual physical exam   [place] [facility] Grayce Sessions, NP   1 year ago OSA (obstructive sleep apnea)   [place] [facility] Grayce Sessions, NP   2 years ago Candida infection   [place] [facility] Grayce Sessions, NP   2 years ago Colon cancer screening   [place] [facility] Grayce Sessions, NP       Future Appointments             In 3 weeks Randa Evens, Kinnie Scales, NP  [facility]

## 2023-10-17 ENCOUNTER — Other Ambulatory Visit: Payer: Self-pay

## 2023-10-18 MED ORDER — SILDENAFIL CITRATE 100 MG PO TABS: 50.0000 mg | ORAL_TABLET | Freq: Every day | ORAL | 3 refills | Status: AC | PRN

## 2023-10-30 ENCOUNTER — Telehealth (INDEPENDENT_AMBULATORY_CARE_PROVIDER_SITE_OTHER): Payer: Self-pay | Admitting: Primary Care

## 2023-10-30 NOTE — Telephone Encounter (Signed)
 Left VM with pt about their upcoming appt.

## 2023-11-05 ENCOUNTER — Other Ambulatory Visit (HOSPITAL_COMMUNITY): Payer: Self-pay | Admitting: Physician Assistant

## 2023-11-05 DIAGNOSIS — G479 Sleep disorder, unspecified: Secondary | ICD-10-CM

## 2023-11-05 DIAGNOSIS — F411 Generalized anxiety disorder: Secondary | ICD-10-CM

## 2023-11-06 ENCOUNTER — Ambulatory Visit (INDEPENDENT_AMBULATORY_CARE_PROVIDER_SITE_OTHER): Payer: MEDICAID | Admitting: Primary Care

## 2023-11-15 ENCOUNTER — Telehealth (INDEPENDENT_AMBULATORY_CARE_PROVIDER_SITE_OTHER): Payer: Self-pay | Admitting: Primary Care

## 2023-11-15 NOTE — Telephone Encounter (Signed)
 Called pt to confirm appt. Pt will be present.

## 2023-11-16 ENCOUNTER — Ambulatory Visit (INDEPENDENT_AMBULATORY_CARE_PROVIDER_SITE_OTHER): Payer: MEDICAID | Admitting: Primary Care

## 2023-11-17 ENCOUNTER — Other Ambulatory Visit (HOSPITAL_COMMUNITY): Payer: Self-pay | Admitting: Physician Assistant

## 2023-11-17 DIAGNOSIS — F200 Paranoid schizophrenia: Secondary | ICD-10-CM

## 2023-11-28 ENCOUNTER — Telehealth (INDEPENDENT_AMBULATORY_CARE_PROVIDER_SITE_OTHER): Payer: Self-pay | Admitting: Primary Care

## 2023-11-28 NOTE — Telephone Encounter (Signed)
 Called pt to confirm appt. Pt did not answer and LVM

## 2023-11-29 ENCOUNTER — Telehealth (INDEPENDENT_AMBULATORY_CARE_PROVIDER_SITE_OTHER): Payer: Self-pay | Admitting: Primary Care

## 2023-11-29 ENCOUNTER — Ambulatory Visit (INDEPENDENT_AMBULATORY_CARE_PROVIDER_SITE_OTHER): Payer: MEDICAID | Admitting: Primary Care

## 2023-11-29 ENCOUNTER — Telehealth (INDEPENDENT_AMBULATORY_CARE_PROVIDER_SITE_OTHER): Payer: Self-pay | Admitting: Pharmacist

## 2023-11-29 NOTE — Telephone Encounter (Signed)
 Called pt to reschedule missed appt. Pt did not answer and LVM.

## 2023-11-30 NOTE — Telephone Encounter (Signed)
 Error

## 2023-12-27 NOTE — Telephone Encounter (Signed)
 Pt No Showed for his appt in May. Please deny that refill.

## 2023-12-29 NOTE — Telephone Encounter (Signed)
 Rx was sent on 10/18/23. Pt states he will reach out to the pharmacy

## 2024-03-06 ENCOUNTER — Telehealth (INDEPENDENT_AMBULATORY_CARE_PROVIDER_SITE_OTHER): Payer: Self-pay | Admitting: Primary Care

## 2024-03-06 NOTE — Telephone Encounter (Signed)
 Left VM with pt about their upcoming appt.

## 2024-03-07 ENCOUNTER — Ambulatory Visit (INDEPENDENT_AMBULATORY_CARE_PROVIDER_SITE_OTHER): Payer: MEDICAID | Admitting: Primary Care

## 2024-04-09 ENCOUNTER — Telehealth: Payer: Self-pay

## 2024-04-09 NOTE — Telephone Encounter (Signed)
 Pt is scheduled to see Blake Ferrari, NP tomorrow to review the HST results. I do not see anything in his chart nor do I see anything regarding if this was completed or not. Routing to the PCC's to confirm if this was done or not. If so, please scan in a copy. If not, please let me or the front desk know so we can cancel the appt.

## 2024-04-09 NOTE — Telephone Encounter (Signed)
 Ok we can address this at visit tomorrow Thank you

## 2024-04-09 NOTE — Telephone Encounter (Signed)
 Looks like patient refused the split night study in 2023. It also seems like the last sleep study was an inlab in 2020. There is currently no order for a home sleep test.

## 2024-04-10 ENCOUNTER — Encounter: Payer: Self-pay | Admitting: Primary Care

## 2024-04-10 ENCOUNTER — Ambulatory Visit: Payer: MEDICAID | Admitting: Primary Care

## 2024-04-10 DIAGNOSIS — G4733 Obstructive sleep apnea (adult) (pediatric): Secondary | ICD-10-CM

## 2024-04-10 NOTE — Progress Notes (Deleted)
 @Patient  ID: Blake [name], male    DOB: 1971-03-12, 53 y.o.   MRN: [id number]  No chief complaint on file.   Referring provider: Celestia Rosaline SQUIBB, NP  HPI: 53 year old male, current smoker. PMH significant dilated aortic root, OSA, spinal stenosis, obesity.  Previous [facility] pulmonary encounter: 03/15/2022 Patient presents today for sleep consult. He has history of sleep apnea. He had a hard time originally tolerating CPAP. He tells me that because of insurance reasons he never had a proper mask fitting or CPAP titration study. He is ope to resuming CPAP if needed. He still has symptoms snoring and witnessed apnea. Associated daytime sleepiness, non-restorative sleep and wakes up in the morning with a sore throat.  No symptoms of narcolepsy, cataplexy or sleepwalking.  Sleep questionnaire Symptoms-  Snoring, apnea, daytime sleepiness and nonrestorative sleep Prior sleep study- 09/21/2018 >> AHI 32.4/hour (weight 353) Bedtime- 10pm-2am Time to fall asleep- varies; 30 mins to several hours  Nocturnal awakenings- 3-5 times Out of bed/start of day- 7am-10am Weight changes- lost 70lbs Do you operate heavy machinery- No Do you currently wear CPAP- No Do you current wear oxygen- No [place]-  15   04/10/2024- Interim hx  Discussed the use of AI scribe software for clinical note transcription with the patient, who gave verbal consent to proceed.  History of Present Illness  Previous refused split night sleep study back in 2023 Last sleep study was done in 2020 No current order for HST     No Known Allergies  Immunization History  Administered Date(s) Administered   Influenza,inj,Quad PF,6+ Mos 05/20/2016, 07/20/2017, 04/26/2018, 04/27/2020, 09/02/2021   PFIZER(Purple Top)SARS-COV-2 Vaccination 02/19/2020   Td 11/12/2007   Tdap 02/05/2018, 07/14/2019    Past Medical History:  Diagnosis Date   Arthritis    Back pain    Dilated aortic root    39mm ascending aorta by echo 09/2017,  normal on echo 08/2018   ERECTILE DYSFUNCTION, MILD 11/12/2007   Qualifier: Diagnosis of  By: Robinson Mckusick     Head injury with loss of consciousness (HCC) 1997   unconsciousness   Heart murmur    as a child   History of blood transfusion    1997, received 15 units of blood    Morbid obesity (HCC)    Neck pain    PVC's (premature ventricular contractions)    TOBACCO ABUSE 11/12/2007   Qualifier: Diagnosis of  By: Jonelle MD, Erin     Tremor    head and hands , controlled on cymbalta     Tobacco History: Social History   Tobacco Use  Smoking Status Every Day   Current packs/day: 0.50   Average packs/day: 0.5 packs/day for 33.0 years (16.5 ttl pk-yrs)   Types: Cigarettes  Smokeless Tobacco Never  Tobacco Comments   1/2 ppd 03/15/22   Ready to quit: Not Answered Counseling given: Not Answered Tobacco comments: 1/2 ppd 03/15/22   Outpatient Medications Prior to Visit  Medication Sig Dispense Refill   acetaminophen  (TYLENOL ) 500 MG tablet Take 500 mg by mouth every 6 (six) hours as needed.     amLODipine  (NORVASC ) 5 MG tablet Take 1 tablet (5 mg total) by mouth daily. 90 tablet 1   aspirin  EC 81 MG tablet Take 1 tablet (81 mg total) by mouth daily. 90 tablet 3   busPIRone  (BUSPAR ) 7.5 MG tablet Take 1 tablet (7.5 mg total) by mouth 2 (two) times daily. 60 tablet 1   diclofenac  (VOLTAREN ) 50 MG EC tablet Take  50 mg by mouth 2 (two) times daily.     nystatin  cream (MYCOSTATIN ) Apply topically 2 (two) times daily. (Patient not taking: Reported on 03/31/2023) 90 g 1   risperiDONE  (RISPERDAL ) 1 MG tablet Take 1 tablet (1 mg total) by mouth 2 (two) times daily. 60 tablet 1   sildenafil  (VIAGRA ) 100 MG tablet Take 0.5-1 tablets (50-100 mg total) by mouth daily as needed for erectile dysfunction. 10 tablet 3   traZODone  (DESYREL ) 50 MG tablet Take 1 tablet (50 mg total) by mouth at bedtime. 30 tablet 1   triamcinolone  ointment (KENALOG ) 0.5 % Apply 1 Application topically 2 (two) times daily. 30  g 0   No facility-administered medications prior to visit.      Review of Systems  Review of Systems   Physical Exam  There were no vitals taken for this visit. Physical Exam  ***  Lab Results:  CBC    Component Value Date/Time   WBC 5.9 08/07/2023 1444   WBC 7.8 11/29/2019 1648   RBC 5.09 08/07/2023 1444   RBC 4.72 11/29/2019 1648   HGB 15.8 08/07/2023 1444   HCT 47.7 08/07/2023 1444   PLT 239 08/07/2023 1444   MCV 94 08/07/2023 1444   MCH 31.0 08/07/2023 1444   MCH 31.4 11/29/2019 1648   MCHC 33.1 08/07/2023 1444   MCHC 32.2 11/29/2019 1648   RDW 14.2 08/07/2023 1444   LYMPHSABS 1.5 08/07/2023 1444   MONOABS 0.7 11/29/2019 1648   EOSABS 0.1 08/07/2023 1444   BASOSABS 0.0 08/07/2023 1444    BMET    Component Value Date/Time   NA 141 08/07/2023 1444   K 5.0 08/07/2023 1444   CL 103 08/07/2023 1444   CO2 22 08/07/2023 1444   GLUCOSE 70 08/07/2023 1444   GLUCOSE 98 11/29/2019 1648   BUN 11 08/07/2023 1444   CREATININE 1.02 08/07/2023 1444   CALCIUM  9.4 08/07/2023 1444   GFRNONAA 83 04/27/2020 1123   GFRAA 96 04/27/2020 1123    BNP No results found for: BNP  ProBNP No results found for: PROBNP  Imaging: No results found.   Assessment & Plan:   No problem-specific Assessment & Plan notes found for this encounter.   1. OSA (obstructive sleep apnea) (Primary)   Assessment and Plan Assessment & Plan        Almarie LELON Ferrari, NP 04/10/2024

## 2024-04-11 ENCOUNTER — Ambulatory Visit: Payer: MEDICAID | Admitting: Internal Medicine

## 2024-04-11 ENCOUNTER — Encounter: Payer: Self-pay | Admitting: Internal Medicine

## 2024-04-11 NOTE — Progress Notes (Deleted)
 Blake [name], male    DOB: 11/25/1970   MRN: [id number]   Brief patient profile:  Blake [name]  *** Sood sleep pt referred to pulmonary clinic 04/11/2024 by *** for ***        History of Present Illness  04/11/2024  Pulmonary/ 1st office eval/Blake [name]  No chief complaint on file.    Dyspnea:  *** Cough: *** Sleep: *** SABA use: *** 02 use:*** LDSCT:***  No obvious day to day or daytime pattern/variability or assoc excess/ purulent sputum or mucus plugs or hemoptysis or cp or chest tightness, subjective wheeze or overt sinus or hb symptoms.    Also denies any obvious fluctuation of symptoms with weather or environmental changes or other aggravating or alleviating factors except as outlined above   No unusual exposure hx or h/o childhood pna/ asthma or knowledge of premature birth.  Current Allergies, Complete Past Medical History, Past Surgical History, Family History, and Social History were reviewed in [employer] record.  ROS  The following are not active complaints unless bolded Hoarseness, sore throat, dysphagia, dental problems, itching, sneezing,  nasal congestion or discharge of excess mucus or purulent secretions, ear ache,   fever, chills, sweats, unintended wt loss or wt gain, classically pleuritic or exertional cp,  orthopnea pnd or arm/hand swelling  or leg swelling, presyncope, palpitations, abdominal pain, anorexia, nausea, vomiting, diarrhea  or change in bowel habits or change in bladder habits, change in stools or change in urine, dysuria, hematuria,  rash, arthralgias, visual complaints, headache, numbness, weakness or ataxia or problems with walking or coordination,  change in mood or  memory.             Outpatient Medications Prior to Visit  Medication Sig Dispense Refill   acetaminophen  (TYLENOL ) 500 MG tablet Take 500 mg by mouth every 6 (six) hours as needed.     amLODipine  (NORVASC ) 5 MG tablet Take 1 tablet (5 mg total) by mouth daily. 90  tablet 1   aspirin  EC 81 MG tablet Take 1 tablet (81 mg total) by mouth daily. 90 tablet 3   busPIRone  (BUSPAR ) 7.5 MG tablet Take 1 tablet (7.5 mg total) by mouth 2 (two) times daily. 60 tablet 1   diclofenac  (VOLTAREN ) 50 MG EC tablet Take 50 mg by mouth 2 (two) times daily.     nystatin  cream (MYCOSTATIN ) Apply topically 2 (two) times daily. (Patient not taking: Reported on 03/31/2023) 90 g 1   risperiDONE  (RISPERDAL ) 1 MG tablet Take 1 tablet (1 mg total) by mouth 2 (two) times daily. 60 tablet 1   sildenafil  (VIAGRA ) 100 MG tablet Take 0.5-1 tablets (50-100 mg total) by mouth daily as needed for erectile dysfunction. 10 tablet 3   traZODone  (DESYREL ) 50 MG tablet Take 1 tablet (50 mg total) by mouth at bedtime. 30 tablet 1   triamcinolone  ointment (KENALOG ) 0.5 % Apply 1 Application topically 2 (two) times daily. 30 g 0   No facility-administered medications prior to visit.    Past Medical History:  Diagnosis Date   Arthritis    Back pain    Dilated aortic root    39mm ascending aorta by echo 09/2017, normal on echo 08/2018   ERECTILE DYSFUNCTION, MILD 11/12/2007   Qualifier: Diagnosis of  By: Robinson Mckusick     Head injury with loss of consciousness (HCC) 1997   unconsciousness   Heart murmur    as a child   History of blood transfusion    1997,  received 15 units of blood    Morbid obesity (HCC)    Neck pain    PVC's (premature ventricular contractions)    TOBACCO ABUSE 11/12/2007   Qualifier: Diagnosis of  By: Jonelle MD, Erin     Tremor    head and hands , controlled on cymbalta       Objective:     There were no vitals taken for this visit.         Assessment

## 2024-05-01 NOTE — Telephone Encounter (Signed)
 Pt never showed for his appt. NFN

## 2024-05-23 ENCOUNTER — Other Ambulatory Visit: Payer: Self-pay

## 2024-05-23 ENCOUNTER — Emergency Department (HOSPITAL_COMMUNITY)
Admission: EM | Admit: 2024-05-23 | Discharge: 2024-05-23 | Disposition: A | Discharge status: Home / Self Care | Payer: MEDICAID | Attending: Emergency Medicine | Admitting: Emergency Medicine

## 2024-05-23 DIAGNOSIS — F1490 Cocaine use, unspecified, uncomplicated: Secondary | ICD-10-CM | POA: Insufficient documentation

## 2024-05-23 DIAGNOSIS — S0185XA Open bite of other part of head, initial encounter: Secondary | ICD-10-CM | POA: Insufficient documentation

## 2024-05-23 DIAGNOSIS — Z23 Encounter for immunization: Secondary | ICD-10-CM | POA: Insufficient documentation

## 2024-05-23 DIAGNOSIS — W503XXA Accidental bite by another person, initial encounter: Secondary | ICD-10-CM

## 2024-05-23 DIAGNOSIS — Z7982 Long term (current) use of aspirin: Secondary | ICD-10-CM | POA: Diagnosis not present

## 2024-05-23 DIAGNOSIS — R000 Tachycardia, unspecified: Secondary | ICD-10-CM | POA: Insufficient documentation

## 2024-05-23 MED ORDER — AMOXICILLIN-POT CLAVULANATE 875-125 MG PO TABS
1.0000 | ORAL_TABLET | Freq: Once | ORAL | Status: AC
Start: 2024-05-23 — End: 2024-05-23
  Administered 2024-05-23: 1 via ORAL
  Filled 2024-05-23: qty 1

## 2024-05-23 MED ORDER — TETANUS-DIPHTH-ACELL PERTUSSIS 5-2-15.5 LF-MCG/0.5 IM SUSP
0.5000 mL | Freq: Once | INTRAMUSCULAR | Status: AC
  Administered 2024-05-23: 0.5 mL via INTRAMUSCULAR
  Filled 2024-05-23: qty 0.5

## 2024-05-23 MED ORDER — AMOXICILLIN-POT CLAVULANATE 875-125 MG PO TABS: 1.0000 | ORAL_TABLET | Freq: Two times a day (BID) | ORAL | 0 refills | Status: AC
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
## 2024-05-23 NOTE — ED Notes (Signed)
 [employer] in room talking with patient.
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
